# Patient Record
Sex: Male | Born: 1970 | Race: White | Hispanic: No | Marital: Married | Smoking: Former smoker
Health system: Southern US, Community
[De-identification: ages and names within clinical notes are randomized; demographics above are authoritative.]

## PROBLEM LIST (undated history)

## (undated) DIAGNOSIS — F329 Major depressive disorder, single episode, unspecified: Secondary | ICD-10-CM

## (undated) DIAGNOSIS — Q245 Malformation of coronary vessels: Secondary | ICD-10-CM

## (undated) DIAGNOSIS — Z8679 Personal history of other diseases of the circulatory system: Secondary | ICD-10-CM

## (undated) DIAGNOSIS — N201 Calculus of ureter: Secondary | ICD-10-CM

## (undated) DIAGNOSIS — R0602 Shortness of breath: Secondary | ICD-10-CM

## (undated) DIAGNOSIS — F32A Depression, unspecified: Secondary | ICD-10-CM

## (undated) DIAGNOSIS — I2510 Atherosclerotic heart disease of native coronary artery without angina pectoris: Secondary | ICD-10-CM

## (undated) DIAGNOSIS — Z8719 Personal history of other diseases of the digestive system: Secondary | ICD-10-CM

## (undated) DIAGNOSIS — I10 Essential (primary) hypertension: Secondary | ICD-10-CM

## (undated) DIAGNOSIS — K219 Gastro-esophageal reflux disease without esophagitis: Secondary | ICD-10-CM

## (undated) DIAGNOSIS — N200 Calculus of kidney: Secondary | ICD-10-CM

## (undated) DIAGNOSIS — R300 Dysuria: Secondary | ICD-10-CM

## (undated) HISTORY — PX: CARDIAC CATHETERIZATION: SHX172

---

## 1990-06-12 HISTORY — PX: OTHER SURGICAL HISTORY: SHX169

## 2000-01-17 ENCOUNTER — Emergency Department (HOSPITAL_COMMUNITY): Admission: EM | Admit: 2000-01-17 | Discharge: 2000-01-17 | Payer: Self-pay | Admitting: Emergency Medicine

## 2000-01-17 ENCOUNTER — Encounter: Payer: Self-pay | Admitting: Emergency Medicine

## 2000-06-09 ENCOUNTER — Emergency Department (HOSPITAL_COMMUNITY): Admission: EM | Admit: 2000-06-09 | Discharge: 2000-06-09 | Payer: Self-pay | Admitting: Emergency Medicine

## 2002-02-04 ENCOUNTER — Encounter: Payer: Self-pay | Admitting: Emergency Medicine

## 2002-02-04 ENCOUNTER — Ambulatory Visit: Admission: RE | Admit: 2002-02-04 | Discharge: 2002-02-04 | Payer: Self-pay | Admitting: Emergency Medicine

## 2003-08-31 ENCOUNTER — Emergency Department (HOSPITAL_COMMUNITY): Admission: EM | Admit: 2003-08-31 | Discharge: 2003-08-31 | Payer: Self-pay | Admitting: Emergency Medicine

## 2004-09-09 ENCOUNTER — Emergency Department (HOSPITAL_COMMUNITY): Admission: EM | Admit: 2004-09-09 | Discharge: 2004-09-09 | Payer: Self-pay | Admitting: Emergency Medicine

## 2008-04-02 ENCOUNTER — Emergency Department (HOSPITAL_COMMUNITY): Admission: EM | Admit: 2008-04-02 | Discharge: 2008-04-02 | Payer: Self-pay | Admitting: Family Medicine

## 2009-03-12 ENCOUNTER — Emergency Department (HOSPITAL_COMMUNITY): Admission: EM | Admit: 2009-03-12 | Discharge: 2009-03-12 | Payer: Self-pay | Admitting: Family Medicine

## 2009-06-04 ENCOUNTER — Emergency Department (HOSPITAL_COMMUNITY): Admission: EM | Admit: 2009-06-04 | Discharge: 2009-06-04 | Payer: Self-pay | Admitting: Emergency Medicine

## 2009-06-07 ENCOUNTER — Emergency Department (HOSPITAL_COMMUNITY): Admission: EM | Admit: 2009-06-07 | Discharge: 2009-06-07 | Payer: Self-pay | Admitting: Emergency Medicine

## 2009-10-12 ENCOUNTER — Emergency Department (HOSPITAL_COMMUNITY): Admission: EM | Admit: 2009-10-12 | Discharge: 2009-10-12 | Payer: Self-pay | Admitting: Family Medicine

## 2010-01-06 ENCOUNTER — Inpatient Hospital Stay (HOSPITAL_COMMUNITY): Admission: EM | Admit: 2010-01-06 | Discharge: 2010-01-07 | Payer: Self-pay | Admitting: Emergency Medicine

## 2010-06-24 ENCOUNTER — Inpatient Hospital Stay (HOSPITAL_COMMUNITY)
Admission: EM | Admit: 2010-06-24 | Discharge: 2010-06-24 | Payer: Self-pay | Source: Home / Self Care | Attending: Interventional Cardiology | Admitting: Interventional Cardiology

## 2010-06-27 LAB — CBC
HCT: 42.3 % (ref 39.0–52.0)
Hemoglobin: 15.3 g/dL (ref 13.0–17.0)
MCH: 31.2 pg (ref 26.0–34.0)
MCHC: 36.2 g/dL — ABNORMAL HIGH (ref 30.0–36.0)
MCV: 86.2 fL (ref 78.0–100.0)
Platelets: 198 10*3/uL (ref 150–400)
RBC: 4.91 MIL/uL (ref 4.22–5.81)
RDW: 12.1 % (ref 11.5–15.5)
WBC: 5.2 10*3/uL (ref 4.0–10.5)

## 2010-06-27 LAB — CK TOTAL AND CKMB (NOT AT ARMC)
CK, MB: 4.7 ng/mL — ABNORMAL HIGH (ref 0.3–4.0)
Relative Index: 4.6 — ABNORMAL HIGH (ref 0.0–2.5)
Total CK: 102 U/L (ref 7–232)

## 2010-06-27 LAB — BASIC METABOLIC PANEL
BUN: 18 mg/dL (ref 6–23)
CO2: 25 mEq/L (ref 19–32)
Calcium: 9.5 mg/dL (ref 8.4–10.5)
Chloride: 103 mEq/L (ref 96–112)
Creatinine, Ser: 1.01 mg/dL (ref 0.4–1.5)
GFR calc Af Amer: >60 mL/min (ref >60)
GFR calc non Af Amer: >60 mL/min (ref >60)
Glucose, Bld: 99 mg/dL (ref 70–99)
Potassium: 4 mEq/L (ref 3.5–5.1)
Sodium: 136 mEq/L (ref 135–145)

## 2010-06-27 LAB — TROPONIN I: Troponin I: 0.01 ng/mL (ref 0.00–0.06)

## 2010-06-27 LAB — DIFFERENTIAL
Basophils Absolute: 0 10*3/uL (ref 0.0–0.1)
Basophils Relative: 1 % (ref 0–1)
Eosinophils Absolute: 0.1 10*3/uL (ref 0.0–0.7)
Eosinophils Relative: 2 % (ref 0–5)
Lymphocytes Relative: 42 % (ref 12–46)
Lymphs Abs: 2.2 10*3/uL (ref 0.7–4.0)
Monocytes Absolute: 0.5 10*3/uL (ref 0.1–1.0)
Monocytes Relative: 9 % (ref 3–12)
Neutro Abs: 2.4 10*3/uL (ref 1.7–7.7)
Neutrophils Relative %: 47 % (ref 43–77)

## 2010-06-27 LAB — CARDIAC PANEL(CRET KIN+CKTOT+MB+TROPI)
CK, MB: 3.7 ng/mL (ref 0.3–4.0)
Relative Index: INVALID (ref 0.0–2.5)
Total CK: 75 U/L (ref 7–232)
Troponin I: <0.01 ng/mL (ref 0.00–0.06)

## 2010-07-21 NOTE — Discharge Summary (Signed)
  [name], [name]             ACCOUNT NO.:  [id number]  MEDICAL RECORD NO.:  [id number]          PATIENT TYPE:  INP  LOCATION:  2022                         FACILITY:  [facility]  PHYSICIAN:  Lyn Records, M.D.   DATE OF BIRTH:  March 21, 1971  DATE OF ADMISSION:  06/24/2010 DATE OF DISCHARGE:  06/24/2010                              DISCHARGE SUMMARY   REASON FOR ADMISSION:  Chest pain.  DISCHARGE DIAGNOSES: 1. Chest pain without evidence of myocardial infarction. 2. 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 Hypertension. 4. Hyperlipidemia.  PROCEDURES PERFORMED:  None.  DISCHARGE INSTRUCTIONS:  Increase metoprolol succinate to 50 mg per day. Other medications will be continued as prior to admission including: 1. Amlodipine 5 mg daily. 2. Aspirin 325 mg daily. 3. Citalopram 20 mg daily. 4. Ibuprofen p.r.n., but he is advised to minimize the use of     ibuprofen. 5. Isosorbide mononitrate 60 mg daily. 6. Multiple vitamins 1 per day. 7. Nitroglycerin 0.4 mg sublingually p.r.n. 8. Omega-3 fatty acid 2 g daily. 9. Omeprazole 20 mg per day. 10.Simvastatin 10 mg per day. 11.Osteo Bi-Flex 1 tablet daily.  ACTIVITY:  As tolerated.  OFFICE VISIT:  Dr. Verdis Prime on July 01, 2010.  DIET:  Heart healthy.  The patient may return to work.  HISTORY AND PHYSICAL AND HOSPITAL COURSE:  The patient has a history of that is concerning for eventual development of coronary artery disease. There is a family history, he has had recurring episodes of chest pain, and is known to have a mid LAD myocardial bridge demonstrated by cath in July 2011.  He has done well for the last 6 months.  Early this morning, while at work, he began having episodes of chest discomfort radiating to the base of his neck.  These episodes were relieved by nitroglycerin.  EKGs were performed and did not demonstrate  evidence of ischemia. Multiple sets of cardiac markers were abnormal.  A nitroglycerin patch was placed and eventually the discomfort has resolved.  In the absence of EKG changes and with no recurrence of discomfort with activity, he is discharged home.  His medication regimen will be adjusted by increasing metoprolol succinate to 50 mg per day.  The patient's laboratory data on admission included a hemoglobin of 15.3, normal white blood cell count, normal platelet count at 198,000, potassium of 4.0.  The initial CK-MB was 4.7 with a reference range of 4.0.  Total CPK was 102.  Troponin values have all been completely normal.  The patient is deemed ambulatory and able to be discharged.     Lyn Records, M.D.     HWS/MEDQ  D:  06/24/2010  T:  06/25/2010  Job:  [id number]  Electronically Signed by Verdis Prime M.D. on 07/21/2010 04:57:12 PM

## 2010-08-11 ENCOUNTER — Ambulatory Visit (INDEPENDENT_AMBULATORY_CARE_PROVIDER_SITE_OTHER): Payer: 59

## 2010-08-11 ENCOUNTER — Inpatient Hospital Stay (INDEPENDENT_AMBULATORY_CARE_PROVIDER_SITE_OTHER)
Admission: RE | Admit: 2010-08-11 | Discharge: 2010-08-11 | Disposition: A | Discharge status: Home / Self Care | Payer: 59 | Source: Ambulatory Visit | Attending: Emergency Medicine | Admitting: Emergency Medicine

## 2010-08-11 DIAGNOSIS — S92919A Unspecified fracture of unspecified toe(s), initial encounter for closed fracture: Secondary | ICD-10-CM

## 2010-08-27 LAB — DIFFERENTIAL
Basophils Absolute: 0 10*3/uL (ref 0.0–0.1)
Basophils Relative: 1 % (ref 0–1)
Eosinophils Relative: 2 % (ref 0–5)
Monocytes Absolute: 0.3 10*3/uL (ref 0.1–1.0)
Monocytes Relative: 9 % (ref 3–12)
Neutro Abs: 2 10*3/uL (ref 1.7–7.7)

## 2010-08-27 LAB — CK TOTAL AND CKMB (NOT AT ARMC)
CK, MB: 3.4 ng/mL (ref 0.3–4.0)
Total CK: 95 U/L (ref 7–232)

## 2010-08-27 LAB — COMPREHENSIVE METABOLIC PANEL
ALT: 29 U/L (ref 0–53)
AST: 25 U/L (ref 0–37)
Albumin: 4.4 g/dL (ref 3.5–5.2)
Alkaline Phosphatase: 54 U/L (ref 39–117)
BUN: 14 mg/dL (ref 6–23)
Chloride: 106 mEq/L (ref 96–112)
GFR calc Af Amer: >60 mL/min (ref >60)
Potassium: 3.8 mEq/L (ref 3.5–5.1)
Sodium: 139 mEq/L (ref 135–145)
Total Bilirubin: 0.9 mg/dL (ref 0.3–1.2)
Total Protein: 6.9 g/dL (ref 6.0–8.3)

## 2010-08-27 LAB — APTT: aPTT: 27 seconds (ref 24–37)

## 2010-08-27 LAB — CARDIAC PANEL(CRET KIN+CKTOT+MB+TROPI)
CK, MB: 1.6 ng/mL (ref 0.3–4.0)
Relative Index: INVALID (ref 0.0–2.5)
Relative Index: INVALID (ref 0.0–2.5)
Relative Index: INVALID (ref 0.0–2.5)
Total CK: 70 U/L (ref 7–232)
Troponin I: 0.01 ng/mL (ref 0.00–0.06)
Troponin I: 0.01 ng/mL (ref 0.00–0.06)
Troponin I: <0.01 ng/mL (ref 0.00–0.06)

## 2010-08-27 LAB — CBC
HCT: 40 % (ref 39.0–52.0)
Hemoglobin: 14.4 g/dL (ref 13.0–17.0)
MCV: 90.7 fL (ref 78.0–100.0)
RBC: 4.41 MIL/uL (ref 4.22–5.81)
RDW: 12.6 % (ref 11.5–15.5)
WBC: 4.1 10*3/uL (ref 4.0–10.5)

## 2010-08-27 LAB — D-DIMER, QUANTITATIVE: D-Dimer, Quant: 0.24 ug/mL-FEU (ref 0.00–0.48)

## 2010-09-12 LAB — URINALYSIS, ROUTINE W REFLEX MICROSCOPIC
Bilirubin Urine: NEGATIVE
Glucose, UA: NEGATIVE mg/dL
Ketones, ur: NEGATIVE mg/dL
Protein, ur: NEGATIVE mg/dL
pH: 6.5 (ref 5.0–8.0)

## 2010-10-28 NOTE — Consult Note (Signed)
NAMEZAIAH, ECKERSON             ACCOUNT NO.:  [id number]   MEDICAL RECORD NO.:  [id number]          PATIENT TYPE:  EMS   LOCATION:  MINO                         FACILITY:  [facility]   PHYSICIAN:  Cherylynn Ridges, M.D.    DATE OF BIRTH:  [birth year]   DATE OF CONSULTATION:  09/09/2004  DATE OF DISCHARGE:  09/09/2004                                   CONSULTATION   I was asked by the family members to see Mr. [name], a 40 year old,  with abdominal pain.  He has been worked up recently over the past month at  [facility] for this problem, the results of which have so far  been negative.  His workup has included an ultrasound and a CT scan, all of  which have been reported as normal.  However, I have not seen these tests on  my own.  It was suggested with this presentation today with some right upper  quadrant discomfort, nausea, and vomiting that the patient may have  gallstones or cholecystitis.  Therefore, I went ahead and ordered a HIDA  scan on the patient in the ED.  This demonstrated a decreased ejection  fraction of 39%;  however, no worsening of his pain.   PAST MEDICAL HISTORY SIGNIFICANT FOR:  1.  Hypertension.  2.  Asthma.  3.  History of depression.   PAST SURGICAL HISTORY:  Unremarkable.  He has had no abdominal surgery.   MEDICATIONS:  1.  Lexapro.  2.  Prilosec.  3.  Timolol.  4.  Lisinopril.  5.  Aspirin.   REVIEW OF SYSTEMS:  He has had nausea and inability to eat solid foods,  normal bowel movements, has had some fevers, no chills.   FAMILY HISTORY:  Noncontributory.   PHYSICAL EXAMINATION TODAY:  VITAL SIGNS:  He came in with a temperature of  99.1.  He is now 97.6.  Pulse was 75, blood pressure 127/74.  HEENT:  He is normocephalic and atraumatic and anicteric.  He was a little  bit erythematous and flushed when I first saw him, but this has resolved  recently.  NECK:  Supple, no palpable masses, no carotid bruits, no adenopathy.  LUNGS:  Clear to auscultation.  He has normal excursion.  CARDIAC EXAM:  Regular rhythm and rate with no murmurs, gallops, thrills, or  heaves.  ABDOMEN:  Soft with minimal tenderness, no palpable masses.  He has good  bowel sounds.  RECTAL EXAM:  Not done by me.   LABORATORY STUDIES:  Hemoglobin normal, white count normal, platelets  normal.  His LFTs show a slight increase in his ALT and AST at 40 and 50,  respective.  His alkaline phosphatase is normal.  Total bilirubin is normal.  His lipase level is normal also.  His HIDA scan demonstrates an ejection  fraction of 39%;  however, the significance of this is unknown.   IMPRESSION:  I do not feel as though the patient has an acute surgical  problem;  however, he could have some chronic cholecystitis which may  respond to a cholecystectomy in the future.  I have  given the patient my  card and asked him to make an appointment to see me early next week.  At  that time, we can review his x-rays and studies from [facility] for  further followup.  In addition, he should stay on clear liquids until these  symptoms resolve and continue to take half a Phenergan tablet or 12.5 mg for  nausea.      JOW/MEDQ  D:  09/09/2004  T:  09/10/2004  Job:  [id number]   cc:   Loletta Parish, M.D.

## 2010-12-21 ENCOUNTER — Inpatient Hospital Stay (HOSPITAL_COMMUNITY)
Admission: EM | Admit: 2010-12-21 | Discharge: 2010-12-22 | DRG: 315 | Disposition: A | Discharge status: Home / Self Care | Payer: 59 | Source: Ambulatory Visit | Attending: Interventional Cardiology | Admitting: Interventional Cardiology

## 2010-12-21 ENCOUNTER — Emergency Department (HOSPITAL_COMMUNITY): Payer: 59

## 2010-12-21 DIAGNOSIS — I319 Disease of pericardium, unspecified: Principal | ICD-10-CM | POA: Diagnosis present

## 2010-12-21 DIAGNOSIS — Z87891 Personal history of nicotine dependence: Secondary | ICD-10-CM

## 2010-12-21 DIAGNOSIS — I10 Essential (primary) hypertension: Secondary | ICD-10-CM | POA: Diagnosis present

## 2010-12-21 DIAGNOSIS — Q245 Malformation of coronary vessels: Secondary | ICD-10-CM

## 2010-12-21 DIAGNOSIS — Z7982 Long term (current) use of aspirin: Secondary | ICD-10-CM

## 2010-12-21 LAB — POCT I-STAT, CHEM 8
BUN: 22 mg/dL (ref 6–23)
Calcium, Ion: 1.16 mmol/L (ref 1.12–1.32)
Chloride: 106 mEq/L (ref 96–112)
Creatinine, Ser: 0.9 mg/dL (ref 0.50–1.35)
Glucose, Bld: 102 mg/dL — ABNORMAL HIGH (ref 70–99)

## 2010-12-21 LAB — CBC
HCT: 35.9 % — ABNORMAL LOW (ref 39.0–52.0)
MCH: 32 pg (ref 26.0–34.0)
MCHC: 37 g/dL — ABNORMAL HIGH (ref 30.0–36.0)
MCV: 86.3 fL (ref 78.0–100.0)
RDW: 12.4 % (ref 11.5–15.5)

## 2010-12-21 LAB — CARDIAC PANEL(CRET KIN+CKTOT+MB+TROPI)
CK, MB: 1.7 ng/mL (ref 0.3–4.0)
CK, MB: 1.7 ng/mL (ref 0.3–4.0)
Relative Index: INVALID (ref 0.0–2.5)
Total CK: 52 U/L (ref 7–232)
Troponin I: <0.3 ng/mL (ref <0.30)

## 2010-12-22 LAB — HIGH SENSITIVITY CRP: CRP, High Sensitivity: 0.1 mg/L

## 2010-12-22 LAB — SEDIMENTATION RATE: Sed Rate: 0 mm/hr (ref 0–16)

## 2010-12-22 LAB — CARDIAC PANEL(CRET KIN+CKTOT+MB+TROPI): Total CK: 37 U/L (ref 7–232)

## 2011-01-05 NOTE — Discharge Summary (Signed)
  [name], [name] NO.:  [id number]  MEDICAL RECORD NO.:  [id number]  LOCATION:  3740                         FACILITY:  [facility]  PHYSICIAN:  Lyn Records, M.D.   DATE OF BIRTH:  10-08-1970  DATE OF ADMISSION:  12/21/2010 DATE OF DISCHARGE:  12/22/2010                              DISCHARGE SUMMARY   REASON FOR ADMISSION:  Chest pain.  DISCHARGE DIAGNOSES: 1. Chest pain, left precordial with pleuritic properties, rule out     relapsing pericarditis. 2. Known history of myocardial bridge documented by catheterization,     otherwise clean coronary arteries.  The bridge is in the left     anterior descending. 3. 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 4. Hypertension.  PLAN: 1. Discontinue amlodipine. 2. Start colchicine 0.6 mg daily.  Continue aspirin 325 mg per day,     citalopram 20 mg per day, ibuprofen 200 mg 3 times per day as     needed, Imdur 120 mg per day, metoprolol succinate 50 mg per day,     multiple vitamins 1 per day, nitroglycerin 0.4 mg or 1 spray every     5 minutes as needed for chest pain up to a total of 3 doses within     15 minutes, Omega-3-acid ethyl esters 2 tablets daily, omeprazole     20 mg per day, simvastatin 10 mg every morning, and Osteo Bi-Flex.  FOLLOWUPLyn Records, MD on February 01, 2011, at 11:45 a.m.  ACTIVITIES:  No restrictions.  The patient is to call if recurrent symptoms or side effects from newly started medications or elevation of blood pressure.  HISTORY AND PHYSICAL AND HOSPITAL COURSE:  Please see Dr. Hoyle Barr admitting note.  Cardiac markers were negative.  EKGs were unremarkable and there was no evidence of evolution.  This is a recurring problem with this patient and we do not have a confirmed diagnosis.  We know he has a myocardial bridge.  I have assumed that there is a component of microvascular coronary artery disease.  Some symptoms seem to respond to nitrates.  There is at time  a slightly different exertional component.  This particular occasion had more pleuritic qualities and did radiate to the neck and back raising the possibility of perhaps a recurring chronic/relapsing pericarditis. This will be purely a clinical diagnosis as there were no ST-segment changes or pericardial rub to substantiate this.  These features and relapsing pericarditis, however, can be absent.  On the morning of discharge, we have drawn an inflammatory panel.  We will start colchicine 0.6 mg per day as a chronic suppressant.  Clinical followup in 4-6 weeks.     Lyn Records, M.D.     HWS/MEDQ  D:  12/22/2010  T:  12/22/2010  Job:  [id number]  Electronically Signed by Verdis Prime M.D. on 01/05/2011 11:27:50 AM

## 2011-01-11 NOTE — H&P (Signed)
NAMEBROGAN, [name] NO.:  [id number]  MEDICAL RECORD NO.:  [id number]  LOCATION:  3740                         FACILITY:  [facility]  PHYSICIAN:  Corky Crafts, MDDATE OF BIRTH:  Apr 17, 1971  DATE OF ADMISSION:  12/21/2010 DATE OF DISCHARGE:                             HISTORY & PHYSICAL   PRIMARY CARDIOLOGIST:  Lyn Records, MD  REASON FOR ADMISSION:  Chest pain.  HISTORY OF PRESENT ILLNESS:  A 40 year old who has been worked up for chest pain in the past.  He has a history of LAD myocardial bridge and he has been treated for microvascular disease with medicines by Dr. Katrinka Blazing.  He had been doing well over the past few weeks.  He had been cycling regularly without any chest discomfort.  He was at work this morning at about 1 a.m.  He started having sharp chest discomfort, it turned into a tightness.  He tried a sublingual nitroglycerin which did not give any relief.  He took his medicines early and his discomfort persisted.  He called the on-call physician who instructed him go to the emergency room.  He then received nitro paste in the ER with relief of his pain.  Just before that, he had some nausea and worsening chest discomfort and sweating with that, that went away with some Zofran. Currently, he is pain free.  He has had a total of 7-8 hours of chest discomfort.  PAST MEDICAL HISTORY: 1. Remote history of asthma. 2. Chest pain. 3. Varicocele.  PAST SURGICAL HISTORY:  Varicocele surgery.  ALLERGIES:  He is intolerant of NSAIDS, CODEINE, CHANTIX, and ERYTHROMYCIN.  MEDICATIONS AT HOME: 1. Norvasc 5 mg daily. 2. Imdur 120 mg daily. 3. Metoprolol extended release 50 mg daily. 4. Zocor 10 mg daily. 5. Aspirin 325 mg daily. 6. Celexa 20 mg daily. 7. Magnesium supplement. 8. Multivitamin. 9. Glucosamine. 10.Fish oil.  SOCIAL HISTORY:  He quit smoking about 5 years ago.  He works at [employer]. He quit alcohol 6 years ago.  He drinks 1-2 sodas a  day.  FAMILY HISTORY:  His father died of a lung cancer at age 39.  Brother had an MI at age 49 and has stents in his LAD.  REVIEW OF SYSTEMS:  Significant for the nausea with chest pain, sweating with nausea.  In the ER, no swelling.  No bleeding problems, chest discomfort as described above.  All other systems negative.  PHYSICAL EXAMINATION:  VITAL SIGNS:  Blood pressure 90/45, heart rate of 15. GENERAL:  He is awake and alert, in no apparent distress. HEENT:  Head, normocephalic, atraumatic. NECK:  No carotid bruits. CARDIOVASCULAR:  Bradycardic, S1 and S2.  No murmurs. LUNGS:  Clear to auscultation bilaterally. ABDOMEN:  Soft, nontender. EXTREMITIES:  Showed no edema, 2+ posterior tibial pulses bilaterally. NEUROLOGIC:  No focal motor or sensory deficits. SKIN:  No rash.  ECG shows normal sinus rhythm with diffuse early repolarization. Hemoglobin 13.3, troponin less than 0.3, creatinine 0.9.  Chest x-ray is pending.  ASSESSMENT:  A 40 year old with chest pain.  PLAN: 1. Cardiac:  There is some atypical features to his chest pain.     However, this was a prolonged episode.  We will observe the patient     on telemetry and rule out for MI with enzymes.  We will continue     his current medicines.  His blood pressure and heart rate are at     the lower limits of normal.  We would be unable to titrate his beta-     blocker or Norvasc at this time.  Consider Ranexa for further     treatment of his microvascular disease. 2. Fairly recent cath showed only an LAD bridge, no significant     coronary artery disease.  Continue aggressive risk factor     modification including statin therapy.     Corky Crafts, MD     JSV/MEDQ  D:  12/21/2010  T:  12/21/2010  Job:  [id number]  Electronically Signed by Lance Muss MD on 01/11/2011 01:19:52 PM

## 2011-03-13 LAB — POCT I-STAT, CHEM 8
Chloride: 103
Creatinine, Ser: 1.1
HCT: 45
Hemoglobin: 15.3
Potassium: 3.5
Sodium: 141

## 2011-08-03 ENCOUNTER — Other Ambulatory Visit: Payer: Self-pay | Admitting: Family Medicine

## 2011-08-03 ENCOUNTER — Ambulatory Visit
Admission: RE | Admit: 2011-08-03 | Discharge: 2011-08-03 | Disposition: A | Discharge status: Home / Self Care | Payer: 59 | Source: Ambulatory Visit | Attending: Family Medicine | Admitting: Family Medicine

## 2011-08-03 DIAGNOSIS — R109 Unspecified abdominal pain: Secondary | ICD-10-CM

## 2011-08-04 ENCOUNTER — Encounter (HOSPITAL_COMMUNITY): Payer: Self-pay | Admitting: Pharmacy Technician

## 2011-08-04 ENCOUNTER — Other Ambulatory Visit: Payer: Self-pay | Admitting: Urology

## 2011-08-04 ENCOUNTER — Encounter (HOSPITAL_COMMUNITY): Payer: Self-pay | Admitting: *Deleted

## 2011-08-04 ENCOUNTER — Emergency Department (HOSPITAL_COMMUNITY)
Admission: EM | Admit: 2011-08-04 | Discharge: 2011-08-04 | Disposition: A | Discharge status: Home / Self Care | Payer: 59 | Attending: Emergency Medicine | Admitting: Emergency Medicine

## 2011-08-04 ENCOUNTER — Encounter (HOSPITAL_COMMUNITY): Payer: Self-pay | Admitting: Emergency Medicine

## 2011-08-04 DIAGNOSIS — I10 Essential (primary) hypertension: Secondary | ICD-10-CM | POA: Insufficient documentation

## 2011-08-04 DIAGNOSIS — R319 Hematuria, unspecified: Secondary | ICD-10-CM | POA: Insufficient documentation

## 2011-08-04 DIAGNOSIS — N23 Unspecified renal colic: Secondary | ICD-10-CM | POA: Insufficient documentation

## 2011-08-04 DIAGNOSIS — K219 Gastro-esophageal reflux disease without esophagitis: Secondary | ICD-10-CM | POA: Insufficient documentation

## 2011-08-04 DIAGNOSIS — R110 Nausea: Secondary | ICD-10-CM | POA: Insufficient documentation

## 2011-08-04 DIAGNOSIS — R109 Unspecified abdominal pain: Secondary | ICD-10-CM | POA: Insufficient documentation

## 2011-08-04 HISTORY — DX: Essential (primary) hypertension: I10

## 2011-08-04 HISTORY — DX: Calculus of kidney: N20.0

## 2011-08-04 HISTORY — DX: Gastro-esophageal reflux disease without esophagitis: K21.9

## 2011-08-04 LAB — URINALYSIS, ROUTINE W REFLEX MICROSCOPIC
Nitrite: NEGATIVE
Specific Gravity, Urine: 1.006 (ref 1.005–1.030)
Urobilinogen, UA: 0.2 mg/dL (ref 0.0–1.0)
pH: 6 (ref 5.0–8.0)

## 2011-08-04 LAB — URINE MICROSCOPIC-ADD ON

## 2011-08-04 MED ORDER — ONDANSETRON 4 MG PO TBDP
8.0000 mg | ORAL_TABLET | Freq: Once | ORAL | Status: AC
  Administered 2011-08-04: 8 mg via ORAL
  Filled 2011-08-04: qty 2

## 2011-08-04 NOTE — Discharge Instructions (Signed)
Ureteral Colic Ureteral colic is spasm-like pain from the kidney or the ureter. This is often caused by a kidney stone. The pain is caused by the stone trying to get through the tubes that pass your pee. HOME CARE   Drink enough fluids to keep your pee (urine) clear or pale yellow.   Strain all your pee. A strainer will be provided. Keep anything caught in the strainer and bring it to your doctor. The stone causing the pain may be very small.   Only take medicine as told by your doctor.   Follow up with your doctor as told.  GET HELP RIGHT AWAY IF:   Pain is not controlled with medicine.   Pain continues or gets worse.   The pain changes and there is chest or belly (abdominal) pain.   You pass out (faint).   You cannot pee.   You keep throwing up (vomiting).   You have a temperature by mouth above 102 F (38.9 C), not controlled by medicine.  MAKE SURE YOU:   Understand these instructions.   Will watch this condition.   Will get help right away if you are not doing well or get worse.  Document Released: 11/15/2007 Document Revised: 02/08/2011 Document Reviewed: 11/15/2007 Exit[employer] Patient Information 2012 [employer]. 

## 2011-08-04 NOTE — ED Notes (Signed)
Patient seen yesterday with kidney stone in ureter and in kidney.  Now with nausea and vomiting.

## 2011-08-04 NOTE — ED Provider Notes (Signed)
History     CSN: [id number]  Arrival date & time 08/04/11  3299   First MD Initiated Contact with Patient 08/04/11 [phone]      Chief Complaint  Patient presents with  . Nephrolithiasis    (Consider location/radiation/quality/duration/timing/severity/associated sxs/prior treatment) HPI Complains of left flank pain radiating to left testicle sharp moderate to severe onset 2 weeks ago becoming worse yesterday seen by Dr. Clarene Duke at [facility] physicians yesterday had outpatient CT scan consistent with left ureteral stone referred to Dr.Dahlsteadt. Presents this morning as discomfort worse and complains of nausea at present pain is 7 on scale of 1-10. Nothing makes symptoms better or worse feels like prior kidney stone no other associated symptoms. No treatment prior to coming here Past Medical History  Diagnosis Date  . Kidney calculi   . Hypertension   . GERD (gastroesophageal reflux disease)   . Angina pectoris, unstable     asthma, pericarditis History reviewed. No pertinent past surgical history.  History reviewed. No pertinent family history.  History  Substance Use Topics  . Smoking status: Former Games developer  . Smokeless tobacco: Not on file  . Alcohol Use: No      Review of Systems  Constitutional: Negative.   HENT: Negative.   Respiratory: Negative.   Cardiovascular: Negative.   Gastrointestinal: Positive for nausea.  Genitourinary: Positive for hematuria and flank pain.  Musculoskeletal: Negative.   Skin: Negative.   Neurological: Negative.   Hematological: Negative.   Psychiatric/Behavioral: Negative.     Allergies  Ancef; Chantix; Codeine; and Erythromycin  Home Medications  No current outpatient prescriptions on file.  BP 140/87  Pulse 74  Temp(Src) 97.8 F (36.6 C) (Oral)  Resp 16  SpO2 95%  Physical Exam  Nursing note and vitals reviewed. Constitutional: He appears well-developed and well-nourished. No distress.  HENT:  Head: Normocephalic and  atraumatic.  Eyes: Conjunctivae are normal. Pupils are equal, round, and reactive to light.  Neck: Neck supple. No tracheal deviation present. No thyromegaly present.  Cardiovascular: Normal rate and regular rhythm.   No murmur heard. Pulmonary/Chest: Effort normal and breath sounds normal.  Abdominal: Soft. Bowel sounds are normal. He exhibits no distension. There is no tenderness.  Genitourinary: Penis normal.       Normal male genitalia  Musculoskeletal: Normal range of motion. He exhibits no edema and no tenderness.  Neurological: He is alert. Coordination normal.  Skin: Skin is warm and dry. No rash noted.  Psychiatric: He has a normal mood and affect.    ED Course  Procedures (including critical care time)  Labs Reviewed - No data to display Ct Abdomen Pelvis Wo Contrast  08/03/2011  *RADIOLOGY REPORT*  Clinical Data: Left flank pain for several weeks, some nausea, microhematuria  CT ABDOMEN AND PELVIS WITHOUT CONTRAST  Technique:  Multidetector CT imaging of the abdomen and pelvis was performed following the standard protocol without intravenous contrast.  Comparison: CT abdomen pelvis of 06/04/2009  Findings: The lung bases are clear.  The liver is unremarkable in the unenhanced state.  The gallbladder is somewhat contracted and no calcified gallstones are seen.  The pancreas is normal in size and the pancreatic duct is not dilated.  The adrenal glands and spleen are unremarkable.  The stomach is moderately fluid distended with no abnormality noted.  A cyst emanates from the upper pole of the left kidney and measures slightly larger than on 06/04/2009.  A left upper pole renal calculus is noted of 3 x 6 mm.  There  is very slight fullness of the left pelvocaliceal system caused by a 4 x 7 mm proximal left ureteral calculus.  The more distal ureters are normal in caliber and no distal ureteral calculus is seen.  The urinary bladder is unremarkable.  The distal ureters appear normal.  The  prostate is normal in size.  The terminal ileum and appendix are unremarkable.  No acute bony abnormality is seen.  IMPRESSION:  1.  Minimal fullness of the left pelvocaliceal system caused by a 4 x 7 mm proximal left ureteral calculus. 2.  Nonobstructing 3 x 6 mm left upper pole renal calculus. 3.  The appendix and terminal ileum appear normal.  Original Report Authenticated By: Juline Patch, M.D.     No diagnosis found.  7:50 AM alert and Lipitor he appears comfortable after treatment with oral Zofran  MDM  Spoke with Dr.Wrenn. Plan patient to go directly to [facility] upon leaving here Patient states he sufficiently comfortable to go to the urology office. Patient alert and in) discharge Diagnosis ureteral colic        Doug Sou, MD 08/04/11 [phone]

## 2011-08-04 NOTE — Progress Notes (Signed)
Instructed to follow laxative as directed by doctor eve before procedure. Plenty of fluids and light dinner. Restricted meds as per [facility]. States EKG with in year with Dr. Katrinka Blazing.Driver must stay with patient in SS.

## 2011-08-06 NOTE — H&P (Signed)
Active Problems Problems  1. Nephrolithiasis Of Both Kidneys 592.0 2. Renal Cyst 593.2 3. Ureteral Stone Left 592.1 4. Varicocele 456.4  History of Present Illness     [name] is a former patient of Dr. Retta Diones with a history of a left renal cyst and bilateral renal stones.  He had the onset over the last 2 weeks of left flank pain.  Two nights ago the pain became more severe with testicular pain.  He had increased pain this morning with nausea and he went to the ER.  He had a CT that showed a 4x73mm left proximal stone.  He had gross hematuria  about 2 weeks ago.  He has no voiding complaints.  He has no other associated signs or symptoms.   Past Medical History Problems  1. History of  Asthma 493.90 2. History of  Depression 311 3. History of  Heartburn 787.1 4. History of  Hypercholesterolemia 272.0 5. History of  Hypertension 401.9  Surgical History Problems  1. History of  Surgery Spermatic Cord Excision Of Varicocele  Current Meds 1. Advair Diskus 250-50 MCG/DOSE Inhalation Aerosol Powder Breath Activated; Therapy:  (Recorded:22Feb2013) to 2. Aspirin 81 MG Oral Tablet; Therapy: (Recorded:20Jan2011) to 3. Colchicine 0.6 MG TABS; Therapy: (Recorded:22Feb2013) to 4. Daily Multivitamin TABS; Therapy: (Recorded:20Jan2011) to 5. Glucosamine CAPS; Therapy: (Recorded:20Jan2011) to 6. Metoprolol Succinate ER 50 MG Oral Tablet Extended Release 24 Hour; Therapy:  (Recorded:22Feb2013) to 7. Tamsulosin HCl 0.4 MG Oral Capsule; Therapy: (Recorded:22Feb2013) to 8. Zocor 10 MG Oral Tablet; Therapy: (Recorded:20Jan2011) to  Allergies Medication  1. Chantix TABS 2. Erythromycin Base TABS  Family History Problems  1. Maternal history of  Arthritis V17.7 2. Maternal history of  Brain Tumor 3. Family history of  Death In The Family Father 2- lung ca 4. Family history of  Family Health Status - Mother's Age 47 5. Paternal history of  Lung Cancer V16.1  Social History Problems    1. Caffeine Use 2-3 per day 2. Marital History - Currently Married 3. Occupation: RN surgical ICU- [employer] 4. Tobacco Use 305.1 smoked for about 15 yrs & quit 4 ys ago Denied  5. History of  Alcohol Use  Review of Systems Genitourinary, constitutional, skin, eye, otolaryngeal, hematologic/lymphatic, cardiovascular, pulmonary, endocrine, musculoskeletal, gastrointestinal, neurological and psychiatric system(s) were reviewed and pertinent findings if present are noted.  Genitourinary: hematuria.  Gastrointestinal: nausea, vomiting and flank pain, but no diarrhea and no constipation.  Constitutional: night sweats, but no fever.  Cardiovascular: no chest pain.  Respiratory: no shortness of breath.    Vitals Vital Signs [Data Includes: Last 1 Day]  22Feb2013 08:38AM  Blood Pressure: 132 / 84 Temperature: 97.9 F Heart Rate: 69 22Feb2013 08:36AM  BMI Calculated: 29.62 BSA Calculated: 2.06 Height: 5 ft 9 in Weight: 200 lb   Physical Exam Constitutional: Well nourished and well developed . No acute distress.  Pulmonary: No respiratory distress and normal respiratory rhythm and effort.  Cardiovascular: Heart rate and rhythm are normal . No peripheral edema.  Abdomen: No masses are palpated. The abdomen is not firm and no rebound. Moderate tenderness in the LLQ is present. moderate left CVA tenderness. No hernias are palpable. No hepatosplenomegaly noted.    Results/Data Urine [Data Includes: Last 1 Day]   22Feb2013  COLOR YELLOW   APPEARANCE CLEAR   SPECIFIC GRAVITY 1.015   pH 6.0   GLUCOSE NEG mg/dL  BILIRUBIN NEG   KETONE NEG mg/dL  BLOOD LARGE   PROTEIN NEG mg/dL  UROBILINOGEN  0.2 mg/dL  NITRITE NEG   LEUKOCYTE ESTERASE NEG   SQUAMOUS EPITHELIAL/HPF NONE SEEN   WBC NONE SEEN WBC/hpf  RBC 21-50 RBC/hpf  BACTERIA NONE SEEN   CRYSTALS NONE SEEN   CASTS NONE SEEN    Old records or history reviewed: I have spoken with Dr. Rennis Chris about this patient.  The  following images/tracing/specimen were independently visualized:  I have reviewed his CT's from this week and 2010. He has a 5cm LMP cyst and a 5mm left renal stone in addition to the ureteral stone. He previously had a right renal stone but that is not present on the recent film. KUB today shows the 4x10mm stone on the left at the top of the L4 transverse process. There is a small RUP stone as seen on CT and phleboliths in the pelvis. No other abnormalities are noted.    Assessment Assessed  1. Ureteral Stone Left 592.1 2. Renal Cyst 593.2   He has a 4x46mm left proximal ureteral stone with pain and nausea.   Plan Health Maintenance (V70.0)  1. UA With REFLEX  Done: 22Feb2013 08:24AM Ureteral Stone (592.1)  2. Ondansetron HCl 4 MG Oral Tablet; TAKE 1 TABLET 4 times daily PRN nausea; Therapy:  22Feb2013 to (Last Rx:22Feb2013)  Requested for: 22Feb2013; Edited 3. Oxycodone-Acetaminophen 5-325 MG Oral Tablet; take 1 or 2 tablets q 4-6 hours prn pain;  Therapy: 22Feb2013 to (Last Rx:22Feb2013) 4. KUB  Done: 22Feb2013 12:00AM 5. Follow-up Schedule Surgery Office  Follow-up  Done: 22Feb2013   I discussed medical therapy, ESWL and ureteroscopy.   He is interested in ESWL and I reviewed the risks of bleeding, infection, need for second procedures, injury to the kidney or adjacent organs and sedation risks. I will give him scripts for percocet and zofran.

## 2011-08-06 NOTE — Discharge Instructions (Signed)
Lithotripsy Care After Refer to this sheet for the next few weeks. These discharge instructions provide you with general information on caring for yourself after you leave the hospital. Your caregiver may also give you specific instructions. Your treatment has been planned according to the most current medical practices available, but unavoidable complications sometimes occur. If you have any problems or questions after discharge, please call your caregiver. AFTER THE PROCEDURE   The recovery time will vary with the procedure done.   You will be taken to the recovery area. A nurse will watch and check your progress. Once you are awake, stable, and taking fluids well, you will be allowed to go home as long as there are no problems.   Your urine may have a red tinge for a few days after treatment. Blood loss is usually minimal.   You may have soreness in the back or flank area. This usually goes away after a few days. The procedure can cause blotches or bruises on the back where the pressure wave enters the skin. These marks usually cause only minimal discomfort and should disappear in a short time.   Stone fragments should begin to pass within 24 hours of treatment. However, a delayed passage is not unusual.   You may have pain, discomfort, and feel sick to your stomach (nauseous) when the crushed (pulverized) fragments of stone are passed down the tube from the kidney to the bladder. Stone fragments can pass soon after the procedure and may last for up to 4 to 8 weeks.   A small number of patients may have severe pain when stone fragments are not able to pass, which leads to an obstruction.   If your stone is greater than 1 inch/2.5 centimeters in diameter or if you have multiple stones that have a combined diameter greater than 1 inch/2.5 centimeters, you may require more than 1 treatment.   You must have someone drive you home.  HOME CARE INSTRUCTIONS   Rest at home until you feel your  energy improving.   Only take over-the-counter or prescription medicines for pain, discomfort, or fever as directed by your caregiver. Depending on the type of lithotripsy, you may need to take medicines (antibiotics) that kill germs and anti-inflammatory medicines for a few days.   Drink enough water and fluids to keep your urine clear or pale yellow. This helps "flush" your kidneys. It helps pass any remaining pieces of stone and prevents stones from coming back.   Most people can resume daily activities within 1 or 2 days after standard lithotripsy. It can take longer to recover from laser and percutaneous lithotripsy.   If the stones are in your urinary system, you may be asked to strain your urine at home to look for stones. Any stones that are found can be sent to a medical lab for examination.   Visit your caregiver for a follow-up appointment in a few weeks. Your doctor may remove your stent if you have one. Your caregiver will also check to see whether stone particles still remain.  SEEK MEDICAL CARE IF:   You have an oral temperature above 102 F (38.9 C).   Your pain is not relieved by medicine.   You have a lasting nauseous feeling.   You feel there is too much blood in the urine.   You develop persistent problems with frequent and/or painful urination that does not at least partially improve after 2 days following the procedure.   You have a congested   cough.  °· You feel lightheaded.  °· You develop a rash or any other signs that might suggest an allergic problem.  °· You develop any reaction or side effects to your medicine(s).  °SEEK IMMEDIATE MEDICAL CARE IF:  °· You experience severe back and/or flank pain.  °· You see nothing but blood when you urinate.  °· You cannot pass any urine at all.  °· You have an oral temperature above 102° F (38.9° C), not controlled by medicine.  °· You develop shortness of breath, difficulty breathing, or chest pain.  °· You develop vomiting  that will not stop after 6 to 8 hours.  °· You have a fainting episode.  °MAKE SURE YOU:  °· Understand these instructions.  °· Will watch your condition.  °· Will get help right away if you are not doing well or get worse.  °Document Released: 06/18/2007 Document Revised: 01/25/2011 Document Reviewed: 06/18/2007 °[employer]® Patient Information ©2012 [employer]. °

## 2011-08-07 ENCOUNTER — Encounter (HOSPITAL_COMMUNITY)
Admission: RE | Disposition: A | Discharge status: Home / Self Care | Payer: Self-pay | Source: Ambulatory Visit | Attending: Urology

## 2011-08-07 ENCOUNTER — Ambulatory Visit (HOSPITAL_COMMUNITY)
Admission: RE | Admit: 2011-08-07 | Discharge: 2011-08-07 | Disposition: A | Discharge status: Home / Self Care | Payer: 59 | Source: Ambulatory Visit | Attending: Urology | Admitting: Urology

## 2011-08-07 ENCOUNTER — Ambulatory Visit (HOSPITAL_COMMUNITY): Payer: 59

## 2011-08-07 ENCOUNTER — Encounter (HOSPITAL_COMMUNITY): Payer: Self-pay

## 2011-08-07 DIAGNOSIS — Z7982 Long term (current) use of aspirin: Secondary | ICD-10-CM | POA: Insufficient documentation

## 2011-08-07 DIAGNOSIS — Z01818 Encounter for other preprocedural examination: Secondary | ICD-10-CM | POA: Insufficient documentation

## 2011-08-07 DIAGNOSIS — R319 Hematuria, unspecified: Secondary | ICD-10-CM | POA: Insufficient documentation

## 2011-08-07 DIAGNOSIS — N200 Calculus of kidney: Secondary | ICD-10-CM | POA: Insufficient documentation

## 2011-08-07 DIAGNOSIS — I209 Angina pectoris, unspecified: Secondary | ICD-10-CM | POA: Insufficient documentation

## 2011-08-07 DIAGNOSIS — J45909 Unspecified asthma, uncomplicated: Secondary | ICD-10-CM | POA: Insufficient documentation

## 2011-08-07 DIAGNOSIS — Z79899 Other long term (current) drug therapy: Secondary | ICD-10-CM | POA: Insufficient documentation

## 2011-08-07 DIAGNOSIS — N201 Calculus of ureter: Secondary | ICD-10-CM | POA: Insufficient documentation

## 2011-08-07 DIAGNOSIS — I10 Essential (primary) hypertension: Secondary | ICD-10-CM | POA: Insufficient documentation

## 2011-08-07 DIAGNOSIS — K449 Diaphragmatic hernia without obstruction or gangrene: Secondary | ICD-10-CM | POA: Insufficient documentation

## 2011-08-07 DIAGNOSIS — N281 Cyst of kidney, acquired: Secondary | ICD-10-CM | POA: Insufficient documentation

## 2011-08-07 DIAGNOSIS — E7800 Pure hypercholesterolemia, unspecified: Secondary | ICD-10-CM | POA: Insufficient documentation

## 2011-08-07 DIAGNOSIS — I861 Scrotal varices: Secondary | ICD-10-CM | POA: Insufficient documentation

## 2011-08-07 HISTORY — PX: EXTRACORPOREAL SHOCK WAVE LITHOTRIPSY: SHX1557

## 2011-08-07 HISTORY — DX: Depression, unspecified: F32.A

## 2011-08-07 HISTORY — DX: Shortness of breath: R06.02

## 2011-08-07 HISTORY — DX: Major depressive disorder, single episode, unspecified: F32.9

## 2011-08-07 SURGERY — LITHOTRIPSY, ESWL
Anesthesia: LOCAL | Laterality: Left

## 2011-08-07 MED ORDER — MORPHINE SULFATE 10 MG/ML IJ SOLN: 2.0000 mg | INTRAMUSCULAR | Status: DC | PRN

## 2011-08-07 MED ORDER — DIAZEPAM 5 MG PO TABS
ORAL_TABLET | ORAL | Status: AC
  Administered 2011-08-07: 10 mg via ORAL
  Filled 2011-08-07: qty 2

## 2011-08-07 MED ORDER — ONDANSETRON HCL 4 MG/2ML IJ SOLN: 4.0000 mg | Freq: Four times a day (QID) | INTRAMUSCULAR | Status: DC | PRN

## 2011-08-07 MED ORDER — OXYCODONE-ACETAMINOPHEN 5-325 MG PO TABS: 1.0000 | ORAL_TABLET | Freq: Four times a day (QID) | ORAL | Status: DC | PRN

## 2011-08-07 MED ORDER — ACETAMINOPHEN 650 MG RE SUPP
650.0000 mg | RECTAL | Status: DC | PRN
  Filled 2011-08-07: qty 1

## 2011-08-07 MED ORDER — CIPROFLOXACIN HCL 500 MG PO TABS
500.0000 mg | ORAL_TABLET | ORAL | Status: AC
  Administered 2011-08-07: 500 mg via ORAL

## 2011-08-07 MED ORDER — OXYCODONE HCL 5 MG PO TABS: 5.0000 mg | ORAL_TABLET | ORAL | Status: DC | PRN

## 2011-08-07 MED ORDER — DIPHENHYDRAMINE HCL 25 MG PO CAPS
ORAL_CAPSULE | ORAL | Status: AC
  Administered 2011-08-07: 25 mg via ORAL
  Filled 2011-08-07: qty 1

## 2011-08-07 MED ORDER — PROMETHAZINE HCL 25 MG/ML IJ SOLN: 12.5000 mg | Freq: Four times a day (QID) | INTRAMUSCULAR | Status: DC | PRN

## 2011-08-07 MED ORDER — CIPROFLOXACIN HCL 500 MG PO TABS
ORAL_TABLET | ORAL | Status: AC
  Administered 2011-08-07: 500 mg via ORAL
  Filled 2011-08-07: qty 1

## 2011-08-07 MED ORDER — SODIUM CHLORIDE 0.9 % IV SOLN: 250.0000 mL | INTRAVENOUS | Status: DC | PRN

## 2011-08-07 MED ORDER — DIPHENHYDRAMINE HCL 25 MG PO CAPS
25.0000 mg | ORAL_CAPSULE | ORAL | Status: AC
  Administered 2011-08-07: 25 mg via ORAL

## 2011-08-07 MED ORDER — DEXTROSE-NACL 5-0.45 % IV SOLN
INTRAVENOUS | Status: DC
  Administered 2011-08-07: 13:00:00 via INTRAVENOUS

## 2011-08-07 MED ORDER — SODIUM CHLORIDE 0.9 % IJ SOLN: 3.0000 mL | Freq: Two times a day (BID) | INTRAMUSCULAR | Status: DC

## 2011-08-07 MED ORDER — SODIUM CHLORIDE 0.9 % IJ SOLN: 3.0000 mL | INTRAMUSCULAR | Status: DC | PRN

## 2011-08-07 MED ORDER — DIAZEPAM 5 MG PO TABS
10.0000 mg | ORAL_TABLET | ORAL | Status: AC
  Administered 2011-08-07: 10 mg via ORAL

## 2011-08-07 MED ORDER — ACETAMINOPHEN 325 MG PO TABS: 650.0000 mg | ORAL_TABLET | ORAL | Status: DC | PRN

## 2011-08-07 NOTE — Interval H&P Note (Signed)
History and Physical Interval Note:  08/07/2011 2:05 PM  [name]  has presented today for surgery, with the diagnosis of left proximal stone  The various methods of treatment have been discussed with the patient and family. After consideration of risks, benefits and other options for treatment, the patient has consented to  Procedure(s) (LRB): EXTRACORPOREAL SHOCK WAVE LITHOTRIPSY (ESWL) (Left) as a surgical intervention .  The patients' history has been reviewed, patient examined, no change in status, stable for surgery.  I have reviewed the patients' chart and labs.  Questions were answered to the patient's satisfaction.   The stone has moved to the mid ureter on KUB today.   Melaina Howerton J

## 2011-08-07 NOTE — Progress Notes (Signed)
Patient has arrived from lithotripsy truck. Left goin is reddened with no skin breakdown from ESWL. 3cm x4cm.  Wife sent to outpt pharmacy to fill presciption.

## 2011-08-08 ENCOUNTER — Encounter (HOSPITAL_COMMUNITY): Payer: Self-pay

## 2011-08-14 ENCOUNTER — Encounter (HOSPITAL_BASED_OUTPATIENT_CLINIC_OR_DEPARTMENT_OTHER): Payer: Self-pay | Admitting: *Deleted

## 2011-08-14 ENCOUNTER — Other Ambulatory Visit: Payer: Self-pay | Admitting: Urology

## 2011-08-14 NOTE — Progress Notes (Signed)
NPO AFTER MN. ARRIVES AT 0800. NEEDS ISTAT. CURRENT EKG W/ CHART. WILL TAKE NEXIUM AND TOPROL AM OF SURG. W/ SIP OF WATER.

## 2011-08-14 NOTE — H&P (Addendum)
History of Present Illness    41 y/o white male with a history of a left renal cyst and bilateral renal stones presents for continued evaluation of left distal ureteral stone s/p recent left ESWL 08/07/11.  He had the onset of left flank pain 1 month ago.  On 08/02/11 the pain became more severe with testicular pain with nausea which progressively worsened and he went to the ER in the early hours 08/04/11.  He had a CT that showed a 4x59mm left proximal stone.  He had an episode of gross hematuria about 2 weeks prior.  He has no voiding complaints.  He has no other associated signs or symptoms.  Interval Hx: He underwent left ESWL 08/07/11 and presents today for follow up.  He has not passed any stone fragments despite straining his urine consistently.  He has had continued left flank pain into the LLQ abd/groin with associated nausea and is requiring narcotic pain medications around the clock to remain comfortable. He is using Zofran prn nausea which helps.  He is missing a lot of work due to this stone and is anxious to return to his job.  He deneis recent gross hematuria, dysuria, fever or chills.  The pain is unchanged with position or activity.   Past Medical History Problems  1. History of  Asthma 493.90 2. History of  Depression 311 3. History of  Heartburn 787.1 4. History of  Hypercholesterolemia 272.0 5. History of  Hypertension 401.9  Surgical History Problems  1. History of  Lithotripsy 2. History of  Surgery Spermatic Cord Excision Of Varicocele  Current Meds 1. Advair Diskus 250-50 MCG/DOSE Inhalation Aerosol Powder Breath Activated; Therapy:  (Recorded:22Feb2013) to 2. Colchicine 0.6 MG TABS; Therapy: (Recorded:22Feb2013) to 3. Metoprolol Succinate ER 50 MG Oral Tablet Extended Release 24 Hour; Therapy:  (Recorded:22Feb2013) to 4. Tamsulosin HCl 0.4 MG Oral Capsule; Therapy: (Recorded:22Feb2013) to 5. Zocor 10 MG Oral Tablet; Therapy: (Recorded:20Jan2011)  to  Allergies Medication  1. Chantix TABS 2. Codeine Derivatives 3. Erythromycin Base TABS  Family History Problems  1. Maternal history of  Arthritis V17.7 2. Maternal history of  Brain Tumor 3. Family history of  Death In The Family Father 17- lung ca 4. Family history of  Family Health Status - Mother's Age 34 5. Paternal history of  Lung Cancer V16.1  Social History Problems  1. Caffeine Use 2-3 per day 2. Marital History - Currently Married 3. Occupation: RN surgical ICU- [employer] 4. Tobacco Use 305.1 smoked for about 15 yrs & quit 4 ys ago Denied  5. History of  Alcohol Use  Review of Systems Genitourinary, constitutional, skin, eye, otolaryngeal, hematologic/lymphatic, cardiovascular, pulmonary, endocrine, musculoskeletal, gastrointestinal, neurological and psychiatric system(s) were reviewed and pertinent findings if present are noted.  Genitourinary: inguinal pain.  Gastrointestinal: flank pain and abdominal pain.    Vitals Vital Signs [Data Includes: Last 1 Day]  04Mar2013 08:19AM  BMI Calculated: 29.62 BSA Calculated: 2.06 Height: 5 ft 9 in Weight: 200 lb  Blood Pressure: 133 / 94, LUE, Sitting Temperature: 98.5 F, Oral Heart Rate: 69  Physical Exam Constitutional: Well nourished and well developed . No acute distress.  ENT:. The ears and nose are normal in appearance.  Neck: The appearance of the neck is normal and no neck mass is present.  Pulmonary: No respiratory distress and normal respiratory rhythm and effort.  Cardiovascular:. No peripheral edema.  Abdomen: No CVA tenderness.  Skin: Normal skin turgor, no visible rash and no visible skin  lesions.  Neuro/Psych:. Mood and affect are appropriate.    Results/Data Urine [Data Includes: Last 1 Day]   04Mar2013  COLOR YELLOW   APPEARANCE CLEAR   SPECIFIC GRAVITY <1.005   pH 6.0   GLUCOSE NEG mg/dL  BILIRUBIN NEG   KETONE NEG mg/dL  BLOOD TRACE   PROTEIN NEG mg/dL  UROBILINOGEN 0.2 mg/dL   NITRITE NEG   LEUKOCYTE ESTERASE NEG   SQUAMOUS EPITHELIAL/HPF RARE   WBC NONE SEEN WBC/hpf  RBC 0-3 RBC/hpf  BACTERIA NONE SEEN   CRYSTALS NONE SEEN   CASTS NONE SEEN    The following images/tracing/specimen were independently visualized: Marland Kitchen     KUB demonstrates the left distal ureteral stone to have slightly migrated distally, now present over the sacrum.  No other urolithiasis noted.  Pelvic phleboliths noted.  The following clinical lab reports were reviewed: Marland Kitchen  Urinalysis Selected Results  UA With REFLEX 04Mar2013 07:54AM Bruning, Ashlyn   Test Name Result Flag Reference  COLOR YELLOW  YELLOW  APPEARANCE CLEAR  CLEAR  SPECIFIC GRAVITY <1.005 L 1.005-1.030  pH 6.0  5.0-8.0  GLUCOSE NEG mg/dL  NEG  BILIRUBIN NEG  NEG  KETONE NEG mg/dL  NEG  BLOOD TRACE A NEG  PROTEIN NEG mg/dL  NEG  UROBILINOGEN 0.2 mg/dL  5.7-3.2  NITRITE NEG  NEG  LEUKOCYTE ESTERASE NEG  NEG  SQUAMOUS EPITHELIAL/HPF RARE  RARE  WBC NONE SEEN WBC/hpf  <4  RBC 0-3 RBC/hpf  <4  BACTERIA NONE SEEN  RARE  CRYSTALS NONE SEEN  NONE SEEN  CASTS NONE SEEN  NONE SEEN   Assessment Assessed  1. Ureteral Stone Left 592.1  Plan Health Maintenance (V70.0)  1. UA With REFLEX  Done: 04Mar2013 07:54AM Ureteral Stone (592.1)  2. Ondansetron HCl 4 MG Oral Tablet; TAKE 1 TABLET 4 times daily PRN nausea; Therapy:  22Feb2013 to (Last Rx:04Mar2013)  Requested for: 04Mar2013 3. Oxycodone-Acetaminophen 5-325 MG Oral Tablet; take 1 or 2 tablets q 4-6 hours prn pain;  Therapy: 22Feb2013 to (Last Rx:04Mar2013)  I reviewed the patient's chart, CT and KUB images. I discussed with the patient and his wife the nature risks and benefits of cystoscopy, left ureteroscopy, ureteral stent, laser lithotripsy including the alternatives such as nontreatment or repeat shockwave lithotripsy. We discussed failure to access the ureter and the need for a stent and staged procedure. We discussed the risk of bleeding infection and ureteral  injury among others.  We also discussed the likelihood of achieving the goals of the procedure and potential problems that might occur during the procedure or recuperation. All questions answered. The patient elects to proceed with left retrograde, ureteroscopy, laser lithotripsy and stent placement.   Add: I should add he has not passed the stone today. He has had no N/V, F/C or dysuria.

## 2011-08-15 ENCOUNTER — Encounter (HOSPITAL_BASED_OUTPATIENT_CLINIC_OR_DEPARTMENT_OTHER): Payer: Self-pay | Admitting: Anesthesiology

## 2011-08-15 ENCOUNTER — Ambulatory Visit (HOSPITAL_BASED_OUTPATIENT_CLINIC_OR_DEPARTMENT_OTHER): Payer: 59 | Admitting: Anesthesiology

## 2011-08-15 ENCOUNTER — Encounter (HOSPITAL_BASED_OUTPATIENT_CLINIC_OR_DEPARTMENT_OTHER): Payer: Self-pay | Admitting: *Deleted

## 2011-08-15 ENCOUNTER — Other Ambulatory Visit: Payer: Self-pay

## 2011-08-15 ENCOUNTER — Encounter (HOSPITAL_BASED_OUTPATIENT_CLINIC_OR_DEPARTMENT_OTHER)
Admission: RE | Disposition: A | Discharge status: Home / Self Care | Payer: Self-pay | Source: Ambulatory Visit | Attending: Urology

## 2011-08-15 ENCOUNTER — Ambulatory Visit (HOSPITAL_BASED_OUTPATIENT_CLINIC_OR_DEPARTMENT_OTHER)
Admission: RE | Admit: 2011-08-15 | Discharge: 2011-08-15 | Disposition: A | Discharge status: Home / Self Care | Payer: 59 | Source: Ambulatory Visit | Attending: Urology | Admitting: Urology

## 2011-08-15 DIAGNOSIS — F3289 Other specified depressive episodes: Secondary | ICD-10-CM | POA: Insufficient documentation

## 2011-08-15 DIAGNOSIS — N201 Calculus of ureter: Secondary | ICD-10-CM | POA: Insufficient documentation

## 2011-08-15 DIAGNOSIS — E7800 Pure hypercholesterolemia, unspecified: Secondary | ICD-10-CM | POA: Insufficient documentation

## 2011-08-15 DIAGNOSIS — F329 Major depressive disorder, single episode, unspecified: Secondary | ICD-10-CM | POA: Insufficient documentation

## 2011-08-15 DIAGNOSIS — R12 Heartburn: Secondary | ICD-10-CM | POA: Insufficient documentation

## 2011-08-15 DIAGNOSIS — J45909 Unspecified asthma, uncomplicated: Secondary | ICD-10-CM | POA: Insufficient documentation

## 2011-08-15 DIAGNOSIS — I10 Essential (primary) hypertension: Secondary | ICD-10-CM | POA: Insufficient documentation

## 2011-08-15 DIAGNOSIS — Z79899 Other long term (current) drug therapy: Secondary | ICD-10-CM | POA: Insufficient documentation

## 2011-08-15 HISTORY — DX: Atherosclerotic heart disease of native coronary artery without angina pectoris: I25.10

## 2011-08-15 HISTORY — DX: Malformation of coronary vessels: Q24.5

## 2011-08-15 HISTORY — DX: Personal history of other diseases of the circulatory system: Z86.79

## 2011-08-15 HISTORY — DX: Dysuria: R30.0

## 2011-08-15 HISTORY — DX: Personal history of other diseases of the digestive system: Z87.19

## 2011-08-15 HISTORY — DX: Calculus of ureter: N20.1

## 2011-08-15 HISTORY — PX: CYSTOSCOPY W/ URETERAL STENT PLACEMENT: SHX1429

## 2011-08-15 LAB — POCT I-STAT 4, (NA,K, GLUC, HGB,HCT): Hemoglobin: 15 g/dL (ref 13.0–17.0)

## 2011-08-15 SURGERY — CYSTOSCOPY, FLEXIBLE, WITH STENT REPLACEMENT
Anesthesia: General | Site: Ureter | Laterality: Left | Wound class: Clean Contaminated

## 2011-08-15 MED ORDER — OXYCODONE-ACETAMINOPHEN 5-325 MG PO TABS
2.0000 | ORAL_TABLET | ORAL | Status: DC | PRN
  Administered 2011-08-15 (×2): 1 via ORAL

## 2011-08-15 MED ORDER — KETOROLAC TROMETHAMINE 30 MG/ML IJ SOLN
INTRAMUSCULAR | Status: DC | PRN
  Administered 2011-08-15: 30 mg via INTRAVENOUS

## 2011-08-15 MED ORDER — LACTATED RINGERS IV SOLN
INTRAVENOUS | Status: DC
  Administered 2011-08-15 (×2): via INTRAVENOUS

## 2011-08-15 MED ORDER — CIPROFLOXACIN IN D5W 400 MG/200ML IV SOLN
400.0000 mg | INTRAVENOUS | Status: AC
  Administered 2011-08-15: 400 mg via INTRAVENOUS

## 2011-08-15 MED ORDER — PHENAZOPYRIDINE HCL 200 MG PO TABS
200.0000 mg | ORAL_TABLET | Freq: Three times a day (TID) | ORAL | Status: AC
  Administered 2011-08-15: 200 mg via ORAL

## 2011-08-15 MED ORDER — MIDAZOLAM HCL 5 MG/5ML IJ SOLN
INTRAMUSCULAR | Status: DC | PRN
  Administered 2011-08-15: 2 mg via INTRAVENOUS

## 2011-08-15 MED ORDER — ONDANSETRON HCL 4 MG/2ML IJ SOLN
INTRAMUSCULAR | Status: DC | PRN
  Administered 2011-08-15: 4 mg via INTRAVENOUS

## 2011-08-15 MED ORDER — PROMETHAZINE HCL 25 MG/ML IJ SOLN
6.2500 mg | INTRAMUSCULAR | Status: DC | PRN
  Administered 2011-08-15: 6.25 mg via INTRAVENOUS

## 2011-08-15 MED ORDER — SODIUM CHLORIDE 0.9 % IR SOLN
Status: DC | PRN
  Administered 2011-08-15: 6000 mL

## 2011-08-15 MED ORDER — FENTANYL CITRATE 0.05 MG/ML IJ SOLN
INTRAMUSCULAR | Status: DC | PRN
  Administered 2011-08-15 (×3): 25 ug via INTRAVENOUS
  Administered 2011-08-15: 100 ug via INTRAVENOUS
  Administered 2011-08-15: 25 ug via INTRAVENOUS

## 2011-08-15 MED ORDER — LACTATED RINGERS IV SOLN
INTRAVENOUS | Status: DC | PRN
  Administered 2011-08-15: 08:00:00 via INTRAVENOUS

## 2011-08-15 MED ORDER — IOHEXOL 350 MG/ML SOLN
INTRAVENOUS | Status: DC | PRN
  Administered 2011-08-15: 50 mL

## 2011-08-15 MED ORDER — CIPROFLOXACIN HCL 500 MG PO TABS: 500.0000 mg | ORAL_TABLET | Freq: Two times a day (BID) | ORAL | Status: AC

## 2011-08-15 MED ORDER — MORPHINE SULFATE 2 MG/ML IJ SOLN
2.0000 mg | INTRAMUSCULAR | Status: DC | PRN
  Administered 2011-08-15: 2 mg via INTRAVENOUS

## 2011-08-15 MED ORDER — DEXAMETHASONE SODIUM PHOSPHATE 4 MG/ML IJ SOLN
INTRAMUSCULAR | Status: DC | PRN
  Administered 2011-08-15: 4 mg via INTRAVENOUS

## 2011-08-15 MED ORDER — BELLADONNA ALKALOIDS-OPIUM 16.2-60 MG RE SUPP
RECTAL | Status: DC | PRN
  Administered 2011-08-15: 1 via RECTAL

## 2011-08-15 MED ORDER — PROPOFOL 10 MG/ML IV EMUL
INTRAVENOUS | Status: DC | PRN
  Administered 2011-08-15: 200 mg via INTRAVENOUS

## 2011-08-15 MED ORDER — FENTANYL CITRATE 0.05 MG/ML IJ SOLN
25.0000 ug | INTRAMUSCULAR | Status: DC | PRN
  Administered 2011-08-15 (×2): 25 ug via INTRAVENOUS
  Administered 2011-08-15: 50 ug via INTRAVENOUS

## 2011-08-15 MED ORDER — LIDOCAINE HCL (CARDIAC) 20 MG/ML IV SOLN
INTRAVENOUS | Status: DC | PRN
  Administered 2011-08-15: 100 mg via INTRAVENOUS

## 2011-08-15 SURGICAL SUPPLY — 39 items
ADAPTER CATH URET PLST 4-6FR (CATHETERS) ×2 IMPLANT
ADPR CATH URET STRL DISP 4-6FR (CATHETERS) ×2
BAG DRAIN URO-CYSTO SKYTR STRL (DRAIN) ×3 IMPLANT
BAG DRN UROCATH (DRAIN) ×2
BASKET LASER NITINOL 1.9FR (BASKET) IMPLANT
BASKET STNLS GEMINI 4WIRE 3FR (BASKET) ×2 IMPLANT
BASKET ZERO TIP NITINOL 2.4FR (BASKET) IMPLANT
BRUSH URET BIOPSY 3F (UROLOGICAL SUPPLIES) IMPLANT
BSKT STON RTRVL 120 1.9FR (BASKET)
BSKT STON RTRVL GEM 120X11 3FR (BASKET) ×2
BSKT STON RTRVL ZERO TP 2.4FR (BASKET)
CANISTER SUCT LVC 12 LTR MEDI- (MISCELLANEOUS) ×2 IMPLANT
CATH INTERMIT  6FR 70CM (CATHETERS) IMPLANT
CATH URET 5FR 28IN CONE TIP (BALLOONS)
CATH URET 5FR 28IN OPEN ENDED (CATHETERS) ×2 IMPLANT
CATH URET 5FR 70CM CONE TIP (BALLOONS) IMPLANT
CLOTH BEACON ORANGE TIMEOUT ST (SAFETY) ×3 IMPLANT
DRAPE CAMERA CLOSED 9X96 (DRAPES) IMPLANT
DRSG TEGADERM 2-3/8X2-3/4 SM (GAUZE/BANDAGES/DRESSINGS) ×2 IMPLANT
ELECT REM PT RETURN 9FT ADLT (ELECTROSURGICAL)
ELECTRODE REM PT RTRN 9FT ADLT (ELECTROSURGICAL) IMPLANT
GLOVE BIO SURGEON STRL SZ 6.5 (GLOVE) ×4 IMPLANT
GLOVE BIO SURGEON STRL SZ7.5 (GLOVE) ×3 IMPLANT
GLOVE ECLIPSE 6.0 STRL STRAW (GLOVE) ×4 IMPLANT
GOWN PREVENTION PLUS LG XLONG (DISPOSABLE) ×3 IMPLANT
GOWN STRL REIN XL XLG (GOWN DISPOSABLE) ×3 IMPLANT
GOWN SURGICAL LARGE (GOWNS) IMPLANT
GUIDEWIRE 0.038 PTFE COATED (WIRE) ×3 IMPLANT
GUIDEWIRE ANG ZIPWIRE 038X150 (WIRE) IMPLANT
GUIDEWIRE STR DUAL SENSOR (WIRE) ×2 IMPLANT
IV NS IRRIG 3000ML ARTHROMATIC (IV SOLUTION) ×6 IMPLANT
KIT BALLIN UROMAX 15FX10 (LABEL) IMPLANT
KIT BALLN UROMAX 15FX4 (MISCELLANEOUS) IMPLANT
KIT BALLN UROMAX 26 75X4 (MISCELLANEOUS)
PACK CYSTOSCOPY (CUSTOM PROCEDURE TRAY) ×3 IMPLANT
SET HIGH PRES BAL DIL (LABEL)
SHEATH URET ACCESS 12FR/35CM (UROLOGICAL SUPPLIES) IMPLANT
SHEATH URET ACCESS 12FR/55CM (UROLOGICAL SUPPLIES) IMPLANT
SYRINGE IRR TOOMEY STRL 70CC (SYRINGE) IMPLANT

## 2011-08-15 NOTE — Discharge Instructions (Signed)
Ureteral Stent A ureteral stent is a soft plastic tube with multiple holes. The stent is inserted into a ureter to help drain urine from the kidney into the bladder. The tube has a coil on each end to keep it from falling out. One end stays in the kidney. The other end stays in the bladder. A stent cannot be seen from the outside. Usually it does not keep you from going about normal routines. A ureteral stent is used to bypass a blockage in your kidney or ureter. This blockage can be caused by kidney stones, scar tissue, pregnancy, or other causes. It can also be used during treatment to remove a kidney stone or to let a ureter heal after surgery. The stent allows urine to drain from the kidney into the bladder. It is most often taken out after the blockage has been removed or the ureter has healed.  HOME CARE INSTRUCTIONS   While the stent is in place, you may feel some discomfort. Certain movements may trigger pain or a feeling that you need to urinate. Your caregiver may give you pain medication. Only take over-the-counter or prescription medicines for pain, discomfort, or fever as directed by your caregiver. Do not take aspirin as this can make bleeding worse.   You may be given medications to prevent infection or bladder spasms. Be sure to take all medications as directed.   Drink plenty of fluids.   You may have small amounts of bleeding causing your urine to be slightly red. This is nothing to be concerned about.  REMOVAL OF THE STENT ***Remove the stent by pulling the string until the entire stent comes out. Remove on Thursday, Aug 17, 2011 SEEK IMMEDIATE MEDICAL CARE IF:   Your urine is dark red or has blood clots.   You are incontinent (leaking urine).   You have an oral temperature above 102 F (38.9 C), chills, nausea (feeling sick to your stomach), or vomiting.   Your pain is not relieved by pain medication. Do not take aspirin as this can make bleeding worse.   The end of the  stent comes out of the urethra.  Document Released: 05/26/2000 Document Revised: 05/18/2011 Document Reviewed: 05/25/2008 [facility] Patient Information 2012 [place], [place].

## 2011-08-15 NOTE — Anesthesia Procedure Notes (Signed)
Procedure Name: LMA Insertion Date/Time: 08/15/2011 9:50 AM Performed by: Iline Oven Pre-anesthesia Checklist: Patient identified, Emergency Drugs available, Suction available and Patient being monitored Patient Re-evaluated:Patient Re-evaluated prior to inductionOxygen Delivery Method: Circle System Utilized Preoxygenation: Pre-oxygenation with 100% oxygen Intubation Type: IV induction Ventilation: Mask ventilation without difficulty LMA: LMA with gastric port inserted LMA Size: 4.0 Number of attempts: 1 Placement Confirmation: positive ETCO2 Tube secured with: Tape Dental Injury: Teeth and Oropharynx as per pre-operative assessment

## 2011-08-15 NOTE — Transfer of Care (Signed)
Immediate Anesthesia Transfer of Care Note  Patient: [name]  Procedure(s) Performed: Procedure(s) (LRB): CYSTOSCOPY WITH STENT REPLACEMENT (Left)  Patient Location: PACU  Anesthesia Type: General  Level of Consciousness: awake, sedated, patient cooperative and responds to stimulation  Airway & Oxygen Therapy: Patient Spontanous Breathing and Patient connected to face mask oxygen  Post-op Assessment: Report given to PACU RN, Post -op Vital signs reviewed and stable and Patient moving all extremities  Post vital signs: Reviewed and stable  Complications: No apparent anesthesia complications

## 2011-08-15 NOTE — Anesthesia Preprocedure Evaluation (Addendum)
Anesthesia Evaluation  Patient identified by MRN, date of birth, ID band Patient awake    Reviewed: Allergy & Precautions, H&P , NPO status , Patient's Chart, lab work & pertinent test results  Airway Mallampati: II TM Distance: >3 FB Neck ROM: Full    Dental No notable dental hx.    Pulmonary shortness of breath, asthma ,  breath sounds clear to auscultation  Pulmonary exam normal       Cardiovascular hypertension, Pt. on home beta blockers and Pt. on medications + angina + CAD Rhythm:Regular Rate:Normal  ECG: sinus brady (45) with early repolarization. 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   Neuro/Psych negative neurological ROS  negative psych ROS   GI/Hepatic Neg liver ROS, hiatal hernia, GERD-  Medicated,  Endo/Other  negative endocrine ROS  Renal/GU negative Renal ROS  negative genitourinary   Musculoskeletal negative musculoskeletal ROS (+)   Abdominal   Peds negative pediatric ROS (+)  Hematology negative hematology ROS (+)   Anesthesia Other Findings   Reproductive/Obstetrics negative OB ROS                          Anesthesia Physical Anesthesia Plan  ASA: III  Anesthesia Plan: General   Post-op Pain Management:    Induction: Intravenous  Airway Management Planned: LMA  Additional Equipment:   Intra-op Plan:   Post-operative Plan: Extubation in OR  Informed Consent: I have reviewed the patients History and Physical, chart, labs and discussed the procedure including the risks, benefits and alternatives for the proposed anesthesia with the patient or authorized representative who has indicated his/her understanding and acceptance.   Dental advisory given  Plan Discussed with: CRNA  Anesthesia Plan Comments:         Anesthesia Quick Evaluation

## 2011-08-15 NOTE — Progress Notes (Signed)
After administration of pnenergan 6.25mg  IV for nausea, no emesis. Pt became diaphoretic, c/o brief episode of mid sternal chest tightness, HR 57, B/P 86/46, RR 26, O2 sats  > 95% & pt responsive. IV fluids wide open, placed in trendelenberg, monitoring VS q 2"-5". Dr Council Mechanic @ bedside. New order noted for EKG. Dr Council Mechanic aware of EKG results.

## 2011-08-15 NOTE — Anesthesia Postprocedure Evaluation (Signed)
  Anesthesia Post-op Note  Patient: [name]  Procedure(s) Performed: Procedure(s) (LRB): CYSTOSCOPY WITH STENT REPLACEMENT (Left)  Patient Location: PACU  Anesthesia Type: General  Level of Consciousness: awake and alert   Airway and Oxygen Therapy: Patient Spontanous Breathing  Post-op Pain: mild  Post-op Assessment: Post-op Vital signs reviewed, Patient's Cardiovascular Status Stable, Respiratory Function Stable, Patent Airway and No signs of Nausea or vomiting  Post-op Vital Signs: stable  Complications: No apparent anesthesia complications. He had momentary chest tightness after phenergan dose. ECG unimpressive. Symptoms resolved quickly.

## 2011-08-15 NOTE — Brief Op Note (Signed)
08/15/2011  10:27 AM  PATIENT:  [name]  41 y.o. male  PRE-OPERATIVE DIAGNOSIS:  left distal ureteral stone  POST-OPERATIVE DIAGNOSIS:  left distal ureteral stone  PROCEDURE:  Procedure(s) (LRB): CYSTOSCOPY WITH LEFT URETEROSCOPY, STONE BASKET EXTRACTION AND STENT REPLACEMENT (Left)  SURGEON:  Surgeon(s) and Role:    * Antony Haste, MD - Primary   ANESTHESIA: Gen.  EBL: Minimal  DRAINS: 6 x 26 cm left ureteral stent with string   SPECIMEN: Left ureteral stone sent to office lab  DICTATION: [phone]  PLAN OF CARE: Discharge to home after PACU  PATIENT DISPOSITION:  PACU - hemodynamically stable.   Delay start of Pharmacological VTE agent (>24hrs) due to surgical blood loss or risk of bleeding: not applicable

## 2011-08-15 NOTE — Progress Notes (Signed)
Pt's VSS & denies c/p or nausea. Pt placed in semi- fowler's. No acute distress noted.

## 2011-08-15 NOTE — Progress Notes (Addendum)
Dr Mena Goes notified of pt's burning urethral meatus & Left flank pain. New order noted to give pyridium 200mg  po now

## 2011-08-16 ENCOUNTER — Emergency Department (HOSPITAL_COMMUNITY)
Admission: EM | Admit: 2011-08-16 | Discharge: 2011-08-16 | Disposition: A | Discharge status: Home / Self Care | Payer: 59 | Attending: Emergency Medicine | Admitting: Emergency Medicine

## 2011-08-16 ENCOUNTER — Emergency Department (HOSPITAL_COMMUNITY): Payer: 59

## 2011-08-16 ENCOUNTER — Encounter (HOSPITAL_BASED_OUTPATIENT_CLINIC_OR_DEPARTMENT_OTHER): Payer: Self-pay | Admitting: Urology

## 2011-08-16 DIAGNOSIS — Z9889 Other specified postprocedural states: Secondary | ICD-10-CM | POA: Insufficient documentation

## 2011-08-16 DIAGNOSIS — K5730 Diverticulosis of large intestine without perforation or abscess without bleeding: Secondary | ICD-10-CM | POA: Insufficient documentation

## 2011-08-16 DIAGNOSIS — R112 Nausea with vomiting, unspecified: Secondary | ICD-10-CM | POA: Insufficient documentation

## 2011-08-16 DIAGNOSIS — K8020 Calculus of gallbladder without cholecystitis without obstruction: Secondary | ICD-10-CM | POA: Insufficient documentation

## 2011-08-16 DIAGNOSIS — N509 Disorder of male genital organs, unspecified: Secondary | ICD-10-CM | POA: Insufficient documentation

## 2011-08-16 DIAGNOSIS — N200 Calculus of kidney: Secondary | ICD-10-CM | POA: Insufficient documentation

## 2011-08-16 DIAGNOSIS — R300 Dysuria: Secondary | ICD-10-CM | POA: Insufficient documentation

## 2011-08-16 DIAGNOSIS — M5126 Other intervertebral disc displacement, lumbar region: Secondary | ICD-10-CM | POA: Insufficient documentation

## 2011-08-16 DIAGNOSIS — I10 Essential (primary) hypertension: Secondary | ICD-10-CM | POA: Insufficient documentation

## 2011-08-16 DIAGNOSIS — I2510 Atherosclerotic heart disease of native coronary artery without angina pectoris: Secondary | ICD-10-CM | POA: Insufficient documentation

## 2011-08-16 DIAGNOSIS — R109 Unspecified abdominal pain: Secondary | ICD-10-CM | POA: Insufficient documentation

## 2011-08-16 LAB — URINALYSIS, ROUTINE W REFLEX MICROSCOPIC
Glucose, UA: NEGATIVE mg/dL
Ketones, ur: NEGATIVE mg/dL
Protein, ur: 30 mg/dL — AB
pH: 6.5 (ref 5.0–8.0)

## 2011-08-16 LAB — BASIC METABOLIC PANEL
BUN: 14 mg/dL (ref 6–23)
CO2: 23 mEq/L (ref 19–32)
Calcium: 9.8 mg/dL (ref 8.4–10.5)
Chloride: 101 mEq/L (ref 96–112)
Creatinine, Ser: 1.11 mg/dL (ref 0.50–1.35)
Glucose, Bld: 137 mg/dL — ABNORMAL HIGH (ref 70–99)

## 2011-08-16 LAB — CBC
HCT: 40.7 % (ref 39.0–52.0)
Hemoglobin: 15 g/dL (ref 13.0–17.0)
MCH: 31.4 pg (ref 26.0–34.0)
MCV: 85.3 fL (ref 78.0–100.0)
Platelets: 221 10*3/uL (ref 150–400)
RBC: 4.77 MIL/uL (ref 4.22–5.81)
WBC: 9.4 10*3/uL (ref 4.0–10.5)

## 2011-08-16 LAB — URINE MICROSCOPIC-ADD ON

## 2011-08-16 LAB — DIFFERENTIAL
Basophils Relative: 0 % (ref 0–1)
Eosinophils Relative: 1 % (ref 0–5)
Lymphs Abs: 2.4 10*3/uL (ref 0.7–4.0)
Monocytes Absolute: 0.6 10*3/uL (ref 0.1–1.0)
Monocytes Relative: 6 % (ref 3–12)
Neutro Abs: 6.3 10*3/uL (ref 1.7–7.7)

## 2011-08-16 MED ORDER — OXYCODONE-ACETAMINOPHEN 5-325 MG PO TABS: 2.0000 | ORAL_TABLET | ORAL | Status: AC | PRN

## 2011-08-16 MED ORDER — SODIUM CHLORIDE 0.9 % IV BOLUS (SEPSIS)
1000.0000 mL | Freq: Once | INTRAVENOUS | Status: AC
  Administered 2011-08-16: 1000 mL via INTRAVENOUS

## 2011-08-16 MED ORDER — ONDANSETRON HCL 4 MG PO TABS: 4.0000 mg | ORAL_TABLET | Freq: Four times a day (QID) | ORAL | Status: AC

## 2011-08-16 MED ORDER — KETOROLAC TROMETHAMINE 30 MG/ML IJ SOLN
30.0000 mg | Freq: Once | INTRAMUSCULAR | Status: AC
  Administered 2011-08-16: 30 mg via INTRAVENOUS
  Filled 2011-08-16: qty 1

## 2011-08-16 MED ORDER — IBUPROFEN 800 MG PO TABS: 800.0000 mg | ORAL_TABLET | Freq: Three times a day (TID) | ORAL | Status: AC

## 2011-08-16 MED ORDER — HYDROMORPHONE HCL PF 1 MG/ML IJ SOLN
1.0000 mg | Freq: Once | INTRAMUSCULAR | Status: AC
  Administered 2011-08-16: 1 mg via INTRAVENOUS
  Filled 2011-08-16: qty 1

## 2011-08-16 MED ORDER — ONDANSETRON HCL 4 MG/2ML IJ SOLN
4.0000 mg | Freq: Once | INTRAMUSCULAR | Status: AC
  Administered 2011-08-16: 4 mg via INTRAVENOUS
  Filled 2011-08-16: qty 2

## 2011-08-16 NOTE — Op Note (Signed)
[name], [name]             ACCOUNT NO.:  [id number]  MEDICAL RECORD NO.:  [id number]  LOCATION:                                FACILITY:  [facility]  PHYSICIAN:  Jerilee Field, MD   DATE OF BIRTH:  1970/11/27  DATE OF PROCEDURE:  08/15/2011 DATE OF DISCHARGE:                              OPERATIVE REPORT   PREOPERATIVE DIAGNOSIS:  Left distal ureteral stone.  POSTOPERATIVE DIAGNOSIS:  Left distal ureteral stone.  PROCEDURE:  Cystoscopy, left ureteroscopy, stone basket extraction and stent placement.  SURGEON:  Jerilee Field, MD.  FINDINGS:  On cystoscopy, the stone was visible through the left ureteral orifice.  On the left intramural ureter, basket attempt to extract it cystoscopically was unsuccessful due to tight stone impaction.  It was removed intact with the ureteroscope.  After stone removal, ureteroscopy revealed a normal distal ureter as far up as the mid pelvis with no other stones or injury, and some inflammation and bleeding in the intramural left ureter where the stone had been impacted.  DESCRIPTION OF THE PROCEDURE:  After consent was obtained, the patient brought to the operating room.  A time-out was performed to confirm the patient and procedure.  He is placed in lithotomy position.  SCDs and antibiotics have been given.  On exam under anesthesia, he had a normal penis and normal testicles without masses.  He was then prepped and draped in the usual fashion.  A cystoscope was passed per urethra.  The stone was visible within the left ureteral orifice.  A Gemini basket was attempted to place adjacent to the stone, but this was not successful, therefore a Sensor wire was advanced up the left ureter and coiled in the left kidney.  A rigid ureteroscope was then advanced into the left ureter and in the intramural ureter, the stone was found.  The basket was then passed past the stone deployed, the stone was then grasped with the basket and removed intact.   It will be sent to pathology with the office labs.  The ureteroscope was then advanced back into the left intramural ureter.  There was some edema and mild bleeding from the mucosa where the stone had been impacted, but otherwise was normal. Once into the left ureter, it appeared normal.  There were no other stone fragments.  I examined up several centimeters into the ureter and it appeared normal, and the wire was in the proper lumen of the ureter. Therefore the scope was removed.  The wire was back-loaded on the cystoscope and a 6 cm x 26 cm left ureteral stent was placed.  The wire was removed and a good coil was seen in the kidney and the bladder. Patient's bladder was drained and the scope was removed.  The string was secured to the patient.  He was then awakened, taken to the recovery room in stable condition.  ESTIMATED BLOOD LOSS:  Minimal.  COMPLICATIONS:  None.  DRAINS:  6 cm x 26 cm left ureteral stent.  DISPOSITION:  Patient is stable to PACU.  He will be discharged to home after he recovers.          ______________________________ Jerilee Field, MD  ME/MEDQ  D:  08/15/2011  T:  08/16/2011  Job:  [id number]

## 2011-08-16 NOTE — ED Notes (Signed)
Pt states he had a kidney stone extracted yesterday and was sent home with a stent  Pt states he has been taking percocet for pain  Pt states he was told to remove stent this evening around 1730  Pt states he has had pain since then  Rating pain 10/10 at this time with nausea

## 2011-08-16 NOTE — ED Provider Notes (Signed)
History     CSN: [id number]  Arrival date & time 08/16/11  2024   First MD Initiated Contact with Patient 08/16/11 2040      No chief complaint on file.   (Consider location/radiation/quality/duration/timing/severity/associated sxs/prior treatment) HPI Comments: Patient presents with severe left flank pain with nausea and vomiting it radiates to his groin ever since having a stone extracted yesterday by Dr. Mena Goes.  The pain is sharp and colicky and radiates to his left flank and groin. Associated with dysuria. He's been taking Percocet every 4 hours with ibuprofen with minimal relief. He denies any fever, chest pain, shortness of breath. He removed the ureteral stent this evening in an attempt to see the pain would improve admitted for a short time and acutely got much worse. The pain radiates to his left testicle and is only mildly improved medications.  The history is provided by the patient and the spouse.    Past Medical History  Diagnosis Date  . Hypertension   . GERD (gastroesophageal reflux disease)   . Asthma   . Shortness of breath     with excertion  . Depression   . H/O hiatal hernia   . History of angina pectoris   . Myocardial Bridge MID LAD  . Coronary artery disease CARDIOLOGIST - DR Verdis Prime--- LAST VISIT OCT 2012    DENIES S & S  . Kidney calculi   . Left ureteral calculus   . Dysuria     Past Surgical History  Procedure Date  . Cardiac catheterization 01-07-2010-- DR Verdis Prime    NORMAL LVF/ PATENT CORONARY ARTERIES/ MYOCARDIAL BRIDGE MID LAD  . Vericocelectomy 1992  . Extracorporeal shock wave lithotripsy 08-07-11    LEFT  . Cystoscopy w/ ureteral stent placement 08/15/2011    Procedure: CYSTOSCOPY WITH STENT REPLACEMENT;  Surgeon: Antony Haste, MD;  Location: [facility];  Service: Urology;  Laterality: Left;  stone basket extraction left, left ureteroscopy    History reviewed. No pertinent family history.  History    Substance Use Topics  . Smoking status: Former Smoker -- 3.0 packs/day for 10 years    Types: Cigarettes, Cigars    Quit date: 08/13/2005  . Smokeless tobacco: Never Used  . Alcohol Use: No      Review of Systems  Constitutional: Positive for activity change and appetite change. Negative for fever.  HENT: Negative for congestion and rhinorrhea.   Eyes: Negative for visual disturbance.  Respiratory: Negative for cough, chest tightness and shortness of breath.   Gastrointestinal: Positive for nausea, vomiting and abdominal pain.  Genitourinary: Positive for dysuria, hematuria, flank pain, difficulty urinating and testicular pain.  Musculoskeletal: Positive for back pain.  Skin: Negative for rash.  Neurological: Negative for dizziness and headaches.    Allergies  Ansaid; Chantix; Codeine; and Erythromycin  Home Medications   Current Outpatient Rx  Name Route Sig Dispense Refill  . ALBUTEROL SULFATE HFA 108 (90 BASE) MCG/ACT IN AERS Inhalation Inhale 2 puffs into the lungs every 6 (six) hours as needed. Wheezing     . ASPIRIN 325 MG PO TBEC Oral Take 325 mg by mouth every morning.     Marland Kitchen CIPROFLOXACIN HCL 500 MG PO TABS Oral Take 1 tablet (500 mg total) by mouth 2 (two) times daily. 6 tablet 0  . COLCHICINE 0.6 MG PO TABS Oral Take 0.6 mg by mouth daily.    Marland Kitchen ESOMEPRAZOLE MAGNESIUM 40 MG PO CPDR Oral Take 40 mg by mouth daily  before breakfast.    . FLUTICASONE-SALMETEROL 250-50 MCG/DOSE IN AEPB Inhalation Inhale 1 puff into the lungs 2 (two) times daily.     Marland Kitchen GLUCOSAMINE-CHONDROITIN 500-400 MG PO TABS Oral Take 1 tablet by mouth 2 (two) times daily.    . IBUPROFEN 800 MG PO TABS Oral Take 800 mg by mouth every 6 (six) hours as needed. For pain    . METOPROLOL SUCCINATE ER 50 MG PO TB24 Oral Take 50 mg by mouth every morning. Take with or immediately following a meal.    . NITROGLYCERIN 0.4 MG/SPRAY TL SOLN Sublingual Place 1 spray under the tongue every 5 (five) minutes as  needed. Chest pain     . OXYCODONE-ACETAMINOPHEN 5-325 MG PO TABS Oral Take 1-2 tablets by mouth every 6 (six) hours as needed. Pain 30 tablet 0  . SIMVASTATIN 10 MG PO TABS Oral Take 10 mg by mouth daily.     Marland Kitchen TAMSULOSIN HCL 0.4 MG PO CAPS Oral Take 0.4 mg by mouth.    . IBUPROFEN 800 MG PO TABS Oral Take 1 tablet (800 mg total) by mouth 3 (three) times daily. 21 tablet 0  . ONDANSETRON HCL 4 MG PO TABS Oral Take 1 tablet (4 mg total) by mouth every 6 (six) hours. 12 tablet 0  . OXYCODONE-ACETAMINOPHEN 5-325 MG PO TABS Oral Take 2 tablets by mouth every 4 (four) hours as needed for pain. 15 tablet 0    BP 124/69  Pulse 72  Temp(Src) 97.6 F (36.4 C) (Oral)  Resp 15  SpO2 94%  Physical Exam  Constitutional: He is oriented to person, place, and time. He appears well-developed and well-nourished. No distress.       Uncomfortable  HENT:  Head: Normocephalic and atraumatic.  Mouth/Throat: Oropharynx is clear and moist.  Eyes: Conjunctivae are normal. Pupils are equal, round, and reactive to light.  Neck: Normal range of motion. Neck supple.  Cardiovascular: Normal rate, regular rhythm and normal heart sounds.   Pulmonary/Chest: Effort normal and breath sounds normal. No respiratory distress.  Abdominal: Soft. There is tenderness. There is no rebound and no guarding.       Left upper quadrant and left lower quadrant pain without guarding.  Genitourinary:       Left testicle tenderness  Musculoskeletal: He exhibits tenderness.       Left CVA tenderness  Neurological: He is alert and oriented to person, place, and time.  Skin: Skin is warm.    ED Course  Procedures (including critical care time)  Labs Reviewed  CBC - Abnormal; Notable for the following:    MCHC 36.9 (*)    All other components within normal limits  BASIC METABOLIC PANEL - Abnormal; Notable for the following:    Potassium 3.4 (*)    Glucose, Bld 137 (*)    GFR calc non Af Amer 82 (*)    All other components  within normal limits  URINALYSIS, ROUTINE W REFLEX MICROSCOPIC - Abnormal; Notable for the following:    Color, Urine ORANGE (*) BIOCHEMICALS MAY BE AFFECTED BY COLOR   APPearance CLOUDY (*)    Hgb urine dipstick LARGE (*)    Protein, ur 30 (*)    Nitrite POSITIVE (*)    Leukocytes, UA TRACE (*)    All other components within normal limits  URINE MICROSCOPIC-ADD ON - Abnormal; Notable for the following:    Squamous Epithelial / LPF FEW (*)    All other components within normal limits  DIFFERENTIAL  Ct Abdomen Pelvis Wo Contrast  08/16/2011  *RADIOLOGY REPORT*  Clinical Data:  Post kidney stone extraction and ureteral stent removal, pain  CT ABDOMEN AND PELVIS WITHOUT CONTRAST  Technique:  Multidetector CT imaging of the abdomen and pelvis was performed following the standard protocol without intravenous contrast. Sagittal and coronal MPR images reconstructed from axial data set.  Comparison: 08/03/2011  Findings: Linear subsegmental atelectasis at both lung bases. Cholelithiasis. Large cyst upper pole left kidney 5.8 x 4.7 x 5.1 cm. 5 mm nonobstructing calculus mid left kidney. Mild left hydronephrosis and hydroureter. Bladder incompletely distended with bladder wall thickening, though this can be from an underdistension artifact. However there appears be slightly greater bladder wall thickening at the region of the left ureterovesicle junction, likely related to preceding cystoscopic procedure. No ureteral calcification identified.  Within limits of a nonenhanced exam no focal abnormalities of the liver, spleen, pancreas, adrenal glands, or right kidney. Food debris in stomach. Sigmoid diverticulosis without evidence of diverticulitis. Normal appendix. Small bowel loops unremarkable. No mass, adenopathy, or free fluid. Dilated internal inguinal rings bilaterally without hernias. No acute osseous findings. Large central disc herniation L5-S1. Tiny umbilical hernia containing fat.  IMPRESSION: Left  hydronephrosis and hydroureter with mild bladder wall thickening at the ureterovesicle junction, question related to recent cystoscopic procedure. Previously identified left ureteral calculus no longer seen. Large cyst and nonobstructing 5 mm calculus noted left kidney. Cholelithiasis. Sigmoid diverticulosis. Bibasilar atelectasis. Central disc herniation L5-S1.  Original Report Authenticated By: Lollie Marrow, M.D.     1. Flank pain       MDM  Flank pain and nausea and vomiting status post kidney stone extraction yesterday. Pain not well controlled at home. UA, labs, IV hydration, narcotics and antiemetics. Will obtain repeat imaging and discuss with urology.  Pain much improved after IV narcotics. Urinalysis shows hematuria without infection consistent with recent urological procedure CT scan shows left hydronephrosis which also is expected given recent procedure.  D/w Dr. Laverle Patter, on call urology who states ureteral edema after procedure can cause pseudoobstruction.  He believes patient removed stent too early.  Does not recommend additional antibiotics (patient still on cipro for 2 more days).  With pain controlled, patient can follow up in clinic.     Glynn Octave, MD 08/16/11 2352

## 2011-08-16 NOTE — ED Notes (Signed)
Patient returned from CT

## 2012-07-08 IMAGING — CR DG CHEST 2V
2 series · 2 of 2 positions shown · non-contrast
Comparison: 06/04/2009

CLINICAL DATA: Chest pain.

CHEST - 2 VIEW

[w chest pa]
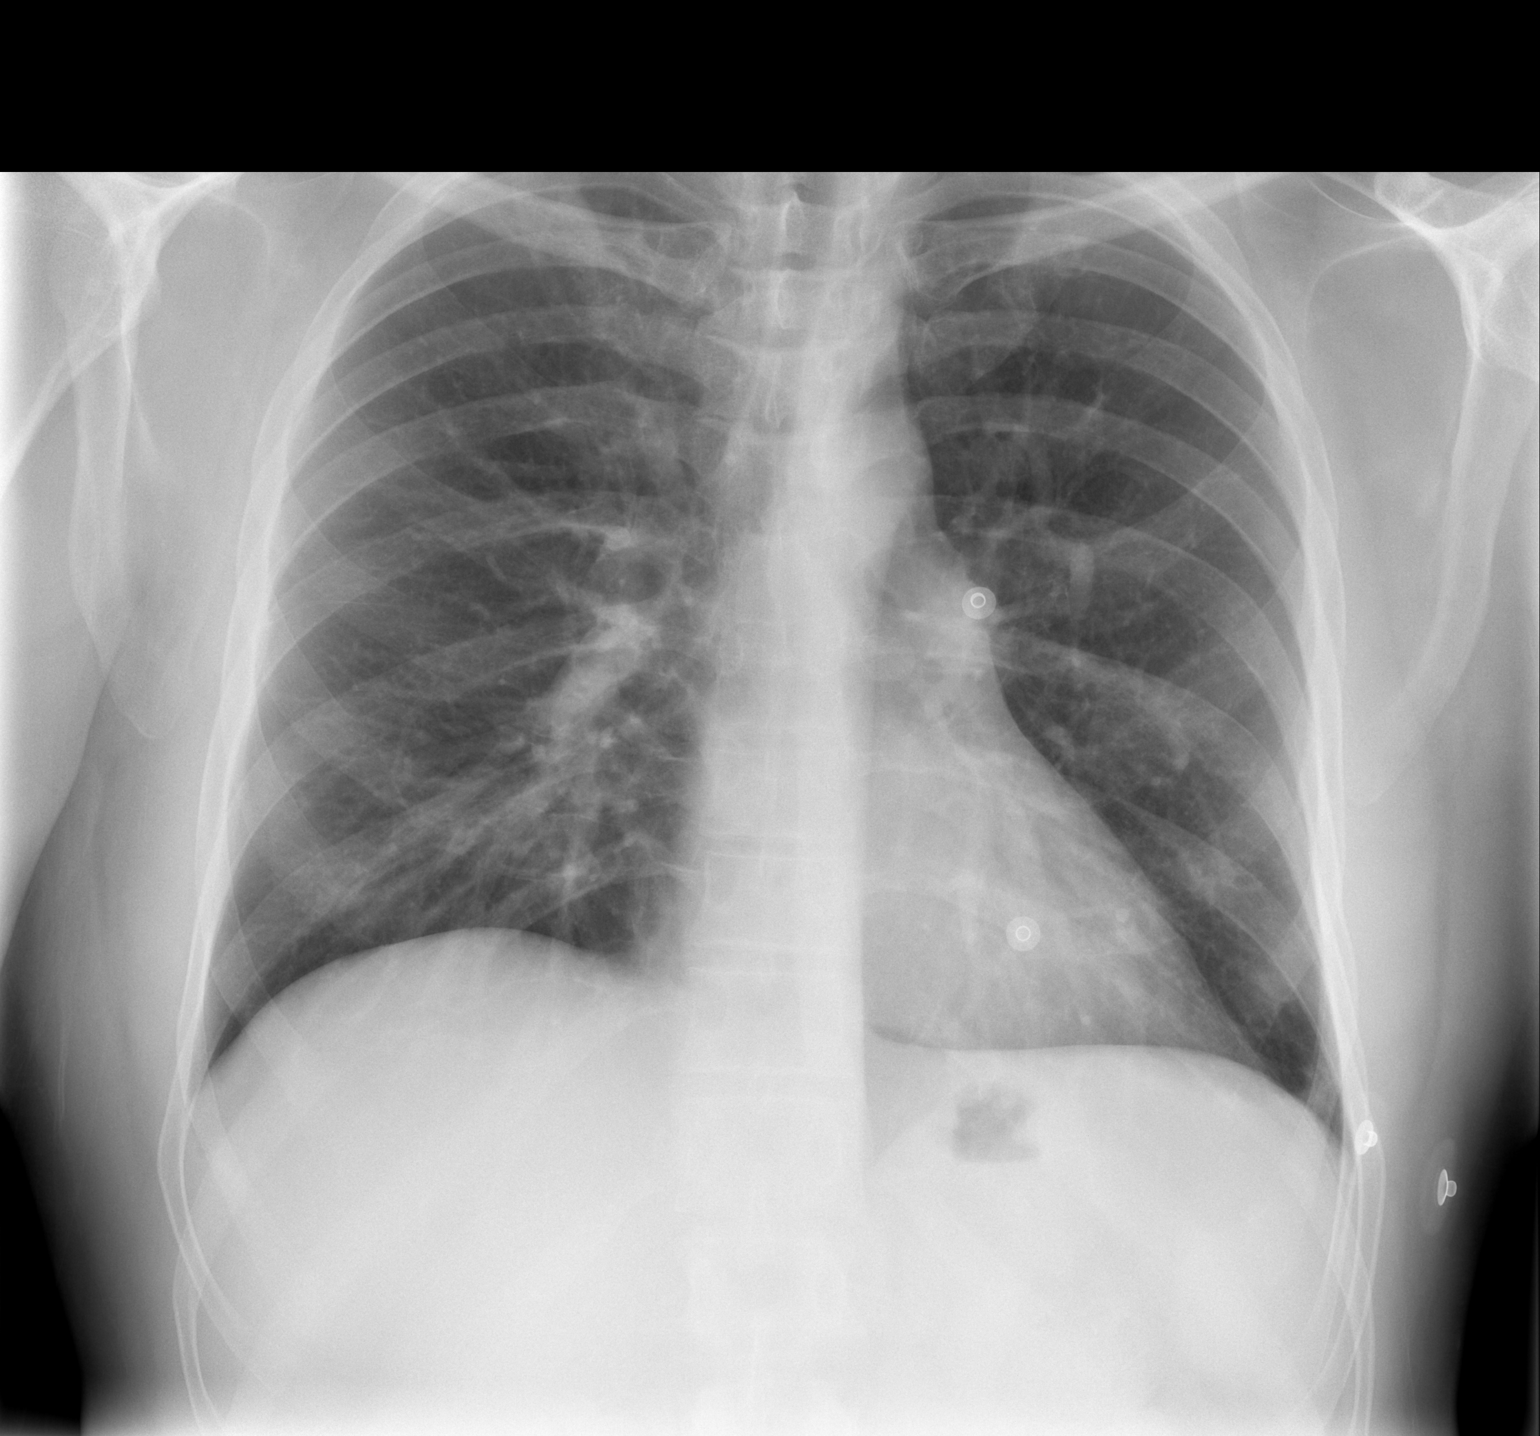

[w chest lat]
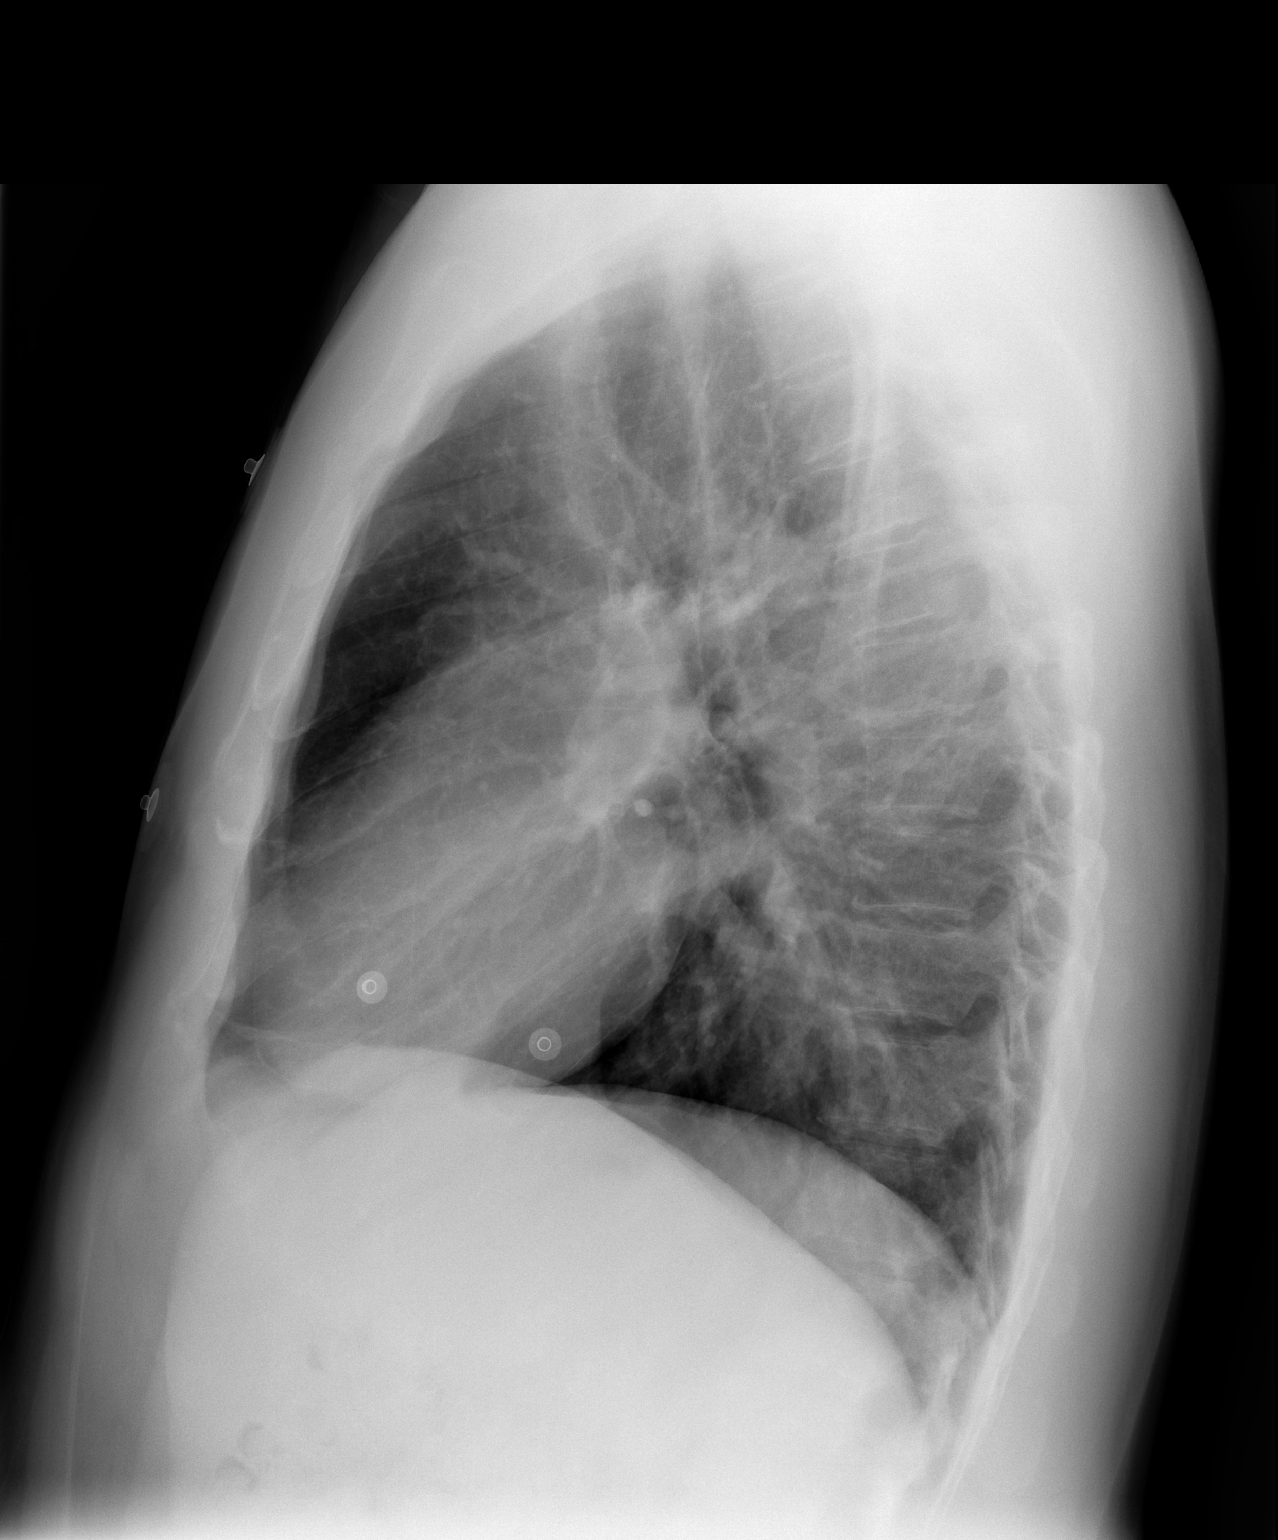

[2 of 2 positions shown; findings below may reference images not displayed]

FINDINGS: Trachea is midline.  Heart size normal.  Lungs are clear.
No pleural fluid.
IMPRESSION: No acute findings.

## 2013-03-12 ENCOUNTER — Ambulatory Visit (INDEPENDENT_AMBULATORY_CARE_PROVIDER_SITE_OTHER): Payer: 59 | Admitting: Interventional Cardiology

## 2013-03-12 ENCOUNTER — Encounter: Payer: Self-pay | Admitting: Interventional Cardiology

## 2013-03-12 VITALS — BP 112/78 | HR 45 | Ht 69.0 in | Wt 163.0 lb

## 2013-03-12 DIAGNOSIS — R079 Chest pain, unspecified: Secondary | ICD-10-CM

## 2013-03-12 DIAGNOSIS — E785 Hyperlipidemia, unspecified: Secondary | ICD-10-CM

## 2013-03-12 DIAGNOSIS — I208 Other forms of angina pectoris: Secondary | ICD-10-CM | POA: Insufficient documentation

## 2013-03-12 DIAGNOSIS — I10 Essential (primary) hypertension: Secondary | ICD-10-CM

## 2013-03-12 NOTE — Progress Notes (Signed)
Patient ID: [name], male   DOB: 23-Jan-1971, 42 y.o.   MRN: [id number]  [address], [address] [place], [place]  [address] Phone: [phone] Fax:  [phone]  Date:  03/12/2013   ID:  [name], DOB 1970-11-14, MRN [id number]  PCP:  Neldon Labella, MD     History of Present Illness: [name] is a 42 y.o. male and has a history of recurring chest pain that has been poorly characterized as to etiology. There is a possibility that he has microvascular angina. He has been asymptomatic since the last office visit. 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 He denies recent syncope, palpitations, chest pain, nitroglycerin use, or other significant cardiovascular complaints. Overall, he feels that he is doing quite well   Wt Readings from Last 3 Encounters:  03/12/13 163 lb (73.936 kg)  08/14/11 200 lb (90.719 kg)  08/14/11 200 lb (90.719 kg)     Past Medical History  Diagnosis Date  . Hypertension   . GERD (gastroesophageal reflux disease)   . Asthma   . Shortness of breath     with excertion  . Depression   . H/O hiatal hernia   . History of angina pectoris   . Myocardial bridge MID LAD  . Coronary artery disease CARDIOLOGIST - DR Verdis Prime--- LAST VISIT OCT 2012    DENIES S & S  . Kidney calculi   . Left ureteral calculus   . Dysuria     Current Outpatient Prescriptions  Medication Sig Dispense Refill  . aspirin 325 MG EC tablet Take 325 mg by mouth every morning.       . colchicine 0.6 MG tablet Take 0.6 mg by mouth daily.      Marland Kitchen glucosamine-chondroitin 500-400 MG tablet Take 1 tablet by mouth 2 (two) times daily.      Marland Kitchen ibuprofen (ADVIL,MOTRIN) 800 MG tablet Take 800 mg by mouth every 6 (six) hours as needed. For pain      . metoprolol succinate (TOPROL-XL) 50 MG 24 hr tablet Take 50 mg by mouth every morning. Take with or immediately following a meal.      . Multiple Vitamin (MULTIVITAMIN) tablet Take 1 tablet by mouth daily.      .  nitroGLYCERIN (NITROLINGUAL) 0.4 MG/SPRAY spray Place 1 spray under the tongue every 5 (five) minutes as needed. Chest pain       . simvastatin (ZOCOR) 10 MG tablet Take 10 mg by mouth daily.        No current facility-administered medications for this visit.    Allergies:    Allergies  Allergen Reactions  . Ansaid [Ocufen] Nausea And Vomiting  . Chantix [Varenicline Tartrate] Other (See Comments)    Gi intolerances   . Codeine Other (See Comments)    Gi intolerance   . Erythromycin Other (See Comments)    Gi intolerances     Social History:  The patient  reports that he quit smoking about 7 years ago. His smoking use included Cigarettes and Cigars. He has a 30 pack-year smoking history. He has never used smokeless tobacco. He reports that he does not drink alcohol or use illicit drugs.   ROS:  Please see the history of present illness.      All other systems reviewed and negative.   PHYSICAL EXAM: VS:  BP 112/78  Pulse 45  Ht 5\' 9"  (1.753 m)  Wt 163 lb (73.936 kg)  BMI 24.06 kg/m2 Well nourished, well developed,  in no acute distress HEENT: normal Neck: no JVDAt 45. Cardiac:  normal S1, S2; RRR; no murmur Lungs:  clear to auscultation bilaterally, no wheezing, rhonchi or rales Abd: soft, nontender, no hepatomegaly No fluid wave is noted. Ext: no edema Skin: warm and dry Neuro:  CNs 2-12 intact, no focal abnormalities noted  EKG:  Sinus bradycardia at 45 beats per minute. Otherwise, normal     ASSESSMENT AND PLAN:  1. No recurrences of chest pain over the last interval since being seen.  2. Blood pressure is under excellent control.  3. Lipids are being treated.  Overall [name] is doing well. No specific tests are needed. We'll see him on an as-needed basis for one year, which ever occurs first.  Signed, Sherilyn Cooter [facility] Leia Alf, MD 03/12/2013 4:08 PM

## 2013-03-12 NOTE — Patient Instructions (Addendum)
One-year followup. Call if problems develop.

## 2013-03-19 ENCOUNTER — Ambulatory Visit: Payer: 59 | Admitting: Interventional Cardiology

## 2013-04-17 ENCOUNTER — Other Ambulatory Visit: Payer: Self-pay

## 2013-06-04 ENCOUNTER — Emergency Department (HOSPITAL_COMMUNITY)
Admission: EM | Admit: 2013-06-04 | Discharge: 2013-06-04 | Disposition: A | Discharge status: Home / Self Care | Payer: 59 | Attending: Emergency Medicine | Admitting: Emergency Medicine

## 2013-06-04 ENCOUNTER — Encounter (HOSPITAL_COMMUNITY): Payer: Self-pay | Admitting: Emergency Medicine

## 2013-06-04 ENCOUNTER — Emergency Department (HOSPITAL_COMMUNITY): Payer: 59

## 2013-06-04 DIAGNOSIS — I209 Angina pectoris, unspecified: Secondary | ICD-10-CM | POA: Insufficient documentation

## 2013-06-04 DIAGNOSIS — S62319A Displaced fracture of base of unspecified metacarpal bone, initial encounter for closed fracture: Secondary | ICD-10-CM | POA: Insufficient documentation

## 2013-06-04 DIAGNOSIS — F3289 Other specified depressive episodes: Secondary | ICD-10-CM | POA: Insufficient documentation

## 2013-06-04 DIAGNOSIS — I10 Essential (primary) hypertension: Secondary | ICD-10-CM | POA: Insufficient documentation

## 2013-06-04 DIAGNOSIS — Z87442 Personal history of urinary calculi: Secondary | ICD-10-CM | POA: Insufficient documentation

## 2013-06-04 DIAGNOSIS — J45909 Unspecified asthma, uncomplicated: Secondary | ICD-10-CM | POA: Insufficient documentation

## 2013-06-04 DIAGNOSIS — S62309A Unspecified fracture of unspecified metacarpal bone, initial encounter for closed fracture: Secondary | ICD-10-CM

## 2013-06-04 DIAGNOSIS — Z79899 Other long term (current) drug therapy: Secondary | ICD-10-CM | POA: Insufficient documentation

## 2013-06-04 DIAGNOSIS — W2209XA Striking against other stationary object, initial encounter: Secondary | ICD-10-CM | POA: Insufficient documentation

## 2013-06-04 DIAGNOSIS — Z87891 Personal history of nicotine dependence: Secondary | ICD-10-CM | POA: Insufficient documentation

## 2013-06-04 DIAGNOSIS — I2510 Atherosclerotic heart disease of native coronary artery without angina pectoris: Secondary | ICD-10-CM | POA: Insufficient documentation

## 2013-06-04 DIAGNOSIS — Z8719 Personal history of other diseases of the digestive system: Secondary | ICD-10-CM | POA: Insufficient documentation

## 2013-06-04 DIAGNOSIS — Y9355 Activity, bike riding: Secondary | ICD-10-CM | POA: Insufficient documentation

## 2013-06-04 DIAGNOSIS — Y92410 Unspecified street and highway as the place of occurrence of the external cause: Secondary | ICD-10-CM | POA: Insufficient documentation

## 2013-06-04 DIAGNOSIS — Z7982 Long term (current) use of aspirin: Secondary | ICD-10-CM | POA: Insufficient documentation

## 2013-06-04 DIAGNOSIS — F329 Major depressive disorder, single episode, unspecified: Secondary | ICD-10-CM | POA: Insufficient documentation

## 2013-06-04 MED ORDER — IBUPROFEN 800 MG PO TABS
800.0000 mg | ORAL_TABLET | Freq: Once | ORAL | Status: AC
  Administered 2013-06-04: 800 mg via ORAL
  Filled 2013-06-04: qty 1

## 2013-06-04 MED ORDER — OXYCODONE-ACETAMINOPHEN 5-325 MG PO TABS: 1.0000 | ORAL_TABLET | ORAL | Status: DC | PRN

## 2013-06-04 MED ORDER — IBUPROFEN 800 MG PO TABS: 800.0000 mg | ORAL_TABLET | Freq: Three times a day (TID) | ORAL | Status: DC
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
## 2013-06-06 NOTE — ED Provider Notes (Signed)
Medical screening examination/treatment/procedure(s) were performed by non-physician practitioner and as supervising physician I was immediately available for consultation/collaboration.   Loren Racer, MD 06/06/13 [phone]

## 2013-06-09 ENCOUNTER — Other Ambulatory Visit: Payer: Self-pay | Admitting: Interventional Cardiology

## 2013-07-24 ENCOUNTER — Ambulatory Visit: Payer: 59 | Attending: Orthopedic Surgery | Admitting: Occupational Therapy

## 2013-07-24 DIAGNOSIS — M25649 Stiffness of unspecified hand, not elsewhere classified: Secondary | ICD-10-CM | POA: Insufficient documentation

## 2013-07-24 DIAGNOSIS — M6281 Muscle weakness (generalized): Secondary | ICD-10-CM | POA: Insufficient documentation

## 2013-07-24 DIAGNOSIS — M25549 Pain in joints of unspecified hand: Secondary | ICD-10-CM | POA: Insufficient documentation

## 2013-07-24 DIAGNOSIS — IMO0001 Reserved for inherently not codable concepts without codable children: Secondary | ICD-10-CM | POA: Insufficient documentation

## 2013-07-30 ENCOUNTER — Ambulatory Visit: Payer: 59 | Admitting: Occupational Therapy

## 2013-08-01 ENCOUNTER — Ambulatory Visit (INDEPENDENT_AMBULATORY_CARE_PROVIDER_SITE_OTHER): Payer: 59 | Admitting: *Deleted

## 2013-08-01 DIAGNOSIS — E785 Hyperlipidemia, unspecified: Secondary | ICD-10-CM

## 2013-08-01 LAB — LIPID PANEL
Cholesterol: 148 mg/dL (ref 0–200)
HDL: 51.5 mg/dL (ref >39.00)
LDL CALC: 83 mg/dL (ref 0–99)
Total CHOL/HDL Ratio: 3
Triglycerides: 69 mg/dL (ref 0.0–149.0)
VLDL: 13.8 mg/dL (ref 0.0–40.0)

## 2013-08-01 LAB — HEPATIC FUNCTION PANEL
ALT: 17 U/L (ref 0–53)
AST: 17 U/L (ref 0–37)
Albumin: 4.5 g/dL (ref 3.5–5.2)
Alkaline Phosphatase: 48 U/L (ref 39–117)
BILIRUBIN TOTAL: 0.8 mg/dL (ref 0.3–1.2)
Bilirubin, Direct: 0 mg/dL (ref 0.0–0.3)
Total Protein: 7 g/dL (ref 6.0–8.3)

## 2013-08-04 ENCOUNTER — Ambulatory Visit: Payer: 59 | Admitting: Occupational Therapy

## 2013-08-08 ENCOUNTER — Telehealth: Payer: Self-pay

## 2013-08-08 NOTE — Telephone Encounter (Signed)
Message copied by Lamar Laundry on Fri Aug 08, 2013 10:35 AM ------      Message from: Daneen Schick      Created: Thu Aug 07, 2013  3:35 PM       Normal liver tests ------

## 2013-08-08 NOTE — Telephone Encounter (Signed)
lmom.  lipid andl liver tests  Normal

## 2013-08-12 ENCOUNTER — Ambulatory Visit: Payer: 59 | Attending: Orthopedic Surgery | Admitting: Occupational Therapy

## 2013-08-12 DIAGNOSIS — M25649 Stiffness of unspecified hand, not elsewhere classified: Secondary | ICD-10-CM | POA: Insufficient documentation

## 2013-08-12 DIAGNOSIS — M25549 Pain in joints of unspecified hand: Secondary | ICD-10-CM | POA: Insufficient documentation

## 2013-08-12 DIAGNOSIS — M6281 Muscle weakness (generalized): Secondary | ICD-10-CM | POA: Insufficient documentation

## 2013-08-22 ENCOUNTER — Encounter: Payer: 59 | Admitting: Occupational Therapy

## 2013-09-22 ENCOUNTER — Encounter (HOSPITAL_COMMUNITY): Payer: Self-pay | Admitting: Emergency Medicine

## 2013-09-22 ENCOUNTER — Emergency Department (HOSPITAL_COMMUNITY)
Admission: EM | Admit: 2013-09-22 | Discharge: 2013-09-22 | Disposition: A | Discharge status: Home / Self Care | Payer: 59 | Source: Home / Self Care | Attending: Family Medicine | Admitting: Family Medicine

## 2013-09-22 ENCOUNTER — Emergency Department (INDEPENDENT_AMBULATORY_CARE_PROVIDER_SITE_OTHER): Payer: 59

## 2013-09-22 DIAGNOSIS — Y9289 Other specified places as the place of occurrence of the external cause: Secondary | ICD-10-CM

## 2013-09-22 DIAGNOSIS — W010XXA Fall on same level from slipping, tripping and stumbling without subsequent striking against object, initial encounter: Secondary | ICD-10-CM

## 2013-09-22 DIAGNOSIS — S20229A Contusion of unspecified back wall of thorax, initial encounter: Secondary | ICD-10-CM

## 2013-09-22 DIAGNOSIS — S300XXA Contusion of lower back and pelvis, initial encounter: Secondary | ICD-10-CM

## 2013-09-22 LAB — POCT URINALYSIS DIP (DEVICE)
BILIRUBIN URINE: NEGATIVE
Glucose, UA: NEGATIVE mg/dL
HGB URINE DIPSTICK: NEGATIVE
Ketones, ur: NEGATIVE mg/dL
LEUKOCYTES UA: NEGATIVE
NITRITE: NEGATIVE
PROTEIN: NEGATIVE mg/dL
Specific Gravity, Urine: 1.01 (ref 1.005–1.030)
Urobilinogen, UA: 0.2 mg/dL (ref 0.0–1.0)
pH: 7 (ref 5.0–8.0)

## 2013-09-22 NOTE — Discharge Instructions (Signed)
Tailbone Injury °The tailbone (coccyx) is the small bone at the lower end of the spine. A tailbone injury may involve stretched ligaments, bruising, or a broken bone (fracture). Women are more vulnerable to this injury due to having a wider pelvis. °CAUSES  °This type of injury typically occurs from falling and landing on the tailbone. Repeated strain or friction from actions such as rowing and bicycling may also injure the area. The tailbone can be injured during childbirth. Infections or tumors may also press on the tailbone and cause pain. Sometimes, the cause of injury is unknown. °SYMPTOMS  °· Bruising. °· Pain when sitting. °· Painful bowel movements. °· In women, pain during intercourse. °DIAGNOSIS  °Your caregiver can diagnose a tailbone injury based on your symptoms and a physical exam. X-rays may be taken if a fracture is suspected. Your caregiver may also use an MRI scan imaging test to evaluate your symptoms. °TREATMENT  °Your caregiver may prescribe medicines to help relieve your pain. Most tailbone injuries heal on their own in 4 to 6 weeks. However, if the injury is caused by an infection or tumor, the recovery period may vary. °PREVENTION  °Wear appropriate padding and sports gear when bicycling and rowing. This can help prevent an injury from repeated strain or friction. °HOME CARE INSTRUCTIONS  °· Put ice on the injured area. °· Put ice in a plastic bag. °· Place a towel between your skin and the bag. °· Leave the ice on for 15-20 minutes, every hour while awake for the first 1 to 2 days. °· Sit on a large, rubber or inflated ring or cushion to ease your pain. Lean forward when sitting to help decrease discomfort. °· Avoid sitting for long periods of time. °· Increase your activity as the pain allows. °· Only take over-the-counter or prescription medicines for pain, discomfort, or fever as directed by your caregiver. °· You may use stool softeners if it is painful to have a bowel movement, or as  directed by your caregiver. °· Eat a diet with plenty of fiber to help prevent constipation. °· Keep all follow-up appointments as directed by your caregiver. °SEEK MEDICAL CARE IF:  °· Your pain becomes worse. °· Your bowel movements cause a great deal of discomfort. °· You are unable to have a bowel movement. °· You have a fever. °MAKE SURE YOU: °· Understand these instructions. °· Will watch your condition. °· Will get help right away if you are not doing well or get worse. °Document Released: 05/26/2000 Document Revised: 08/21/2011 Document Reviewed: 12/22/2010 °[employer]® Patient Information ©2014 [employer]. ° °

## 2013-09-22 NOTE — ED Notes (Signed)
Slipped and fell yesterday , landed on tailbone

## 2013-09-22 NOTE — ED Provider Notes (Signed)
CSN: [id number]     Arrival date & time 09/22/13  1754 History   First MD Initiated Contact with Patient 09/22/13 1851     Chief Complaint  Patient presents with  . Fall   (Consider location/radiation/quality/duration/timing/severity/associated sxs/prior Treatment) HPI Comments: Patient reports he was performing a survey of a creek yesterday when he slipped on a wet rock, fell backwards and landed on buttocks and lower back. Has had persistent discomfort since the fall. Presents for evaluation. No head trauma. No bowel or bladder incontinence. No genital anesthesia. No loss of strength or sensation of lower extremities. Reports some radiation of pain to right buttock  The history is provided by the patient.    Past Medical History  Diagnosis Date  . Hypertension   . GERD (gastroesophageal reflux disease)   . Asthma   . Shortness of breath     with excertion  . Depression   . H/O hiatal hernia   . History of angina pectoris   . Myocardial bridge MID LAD  . Coronary artery disease CARDIOLOGIST - DR Daneen Schick--- LAST VISIT OCT 2012    DENIES S & S  . Kidney calculi   . Left ureteral calculus   . Dysuria    Past Surgical History  Procedure Laterality Date  . Cardiac catheterization  01-07-2010-- DR Daneen Schick    NORMAL LVF/ PATENT CORONARY ARTERIES/ MYOCARDIAL BRIDGE MID LAD  . Vericocelectomy  1992  . Extracorporeal shock wave lithotripsy  08-07-11    LEFT  . Cystoscopy w/ ureteral stent placement  08/15/2011    Procedure: CYSTOSCOPY WITH STENT REPLACEMENT;  Surgeon: Fredricka Bonine, MD;  Location: [facility];  Service: Urology;  Laterality: Left;  stone basket extraction left, left ureteroscopy   History reviewed. No pertinent family history. History  Substance Use Topics  . Smoking status: Former Smoker -- 3.00 packs/day for 10 years    Types: Cigarettes, Cigars    Quit date: 08/13/2005  . Smokeless tobacco: Never Used  . Alcohol Use: No     Review of Systems  All other systems reviewed and are negative.   Allergies  Ansaid; Chantix; Codeine; and Erythromycin  Home Medications   Current Outpatient Rx  Name  Route  Sig  Dispense  Refill  . aspirin 325 MG EC tablet   Oral   Take 325 mg by mouth every morning.          . citalopram (CELEXA) 20 MG tablet   Oral   Take 20 mg by mouth daily.         . colchicine 0.6 MG tablet   Oral   Take 0.6 mg by mouth daily.         Marland Kitchen glucosamine-chondroitin 500-400 MG tablet   Oral   Take 1 tablet by mouth 2 (two) times daily.         Marland Kitchen ibuprofen (ADVIL,MOTRIN) 800 MG tablet   Oral   Take 800 mg by mouth every 6 (six) hours as needed. For pain         . ibuprofen (ADVIL,MOTRIN) 800 MG tablet   Oral   Take 1 tablet (800 mg total) by mouth 3 (three) times daily.   21 tablet   0   . metoprolol succinate (TOPROL-XL) 50 MG 24 hr tablet   Oral   Take 50 mg by mouth every morning. Take with or immediately following a meal.         . metoprolol succinate (TOPROL-XL) 50  MG 24 hr tablet      TAKE 1 TABLET BY MOUTH DAILY   90 tablet   3   . Multiple Vitamin (MULTIVITAMIN) tablet   Oral   Take 1 tablet by mouth daily.         . naproxen sodium (ANAPROX) 220 MG tablet   Oral   Take 440 mg by mouth 2 (two) times daily as needed (for pain).         . nitroGLYCERIN (NITROLINGUAL) 0.4 MG/SPRAY spray   Sublingual   Place 1 spray under the tongue every 5 (five) minutes as needed. Chest pain          . oxyCODONE-acetaminophen (PERCOCET/ROXICET) 5-325 MG per tablet   Oral   Take 1-2 tablets by mouth every 4 (four) hours as needed for severe pain.   20 tablet   0   . simvastatin (ZOCOR) 10 MG tablet   Oral   Take 10 mg by mouth daily.           BP 134/72  Pulse 54  Temp(Src) 98 F (36.7 C) (Oral)  Resp 14 Physical Exam  Nursing note and vitals reviewed. Constitutional: He is oriented to person, place, and time. He appears well-developed and  well-nourished. No distress.  HENT:  Head: Normocephalic and atraumatic.  Eyes: Conjunctivae are normal.  Neck: Normal range of motion.  Cardiovascular: Normal rate.   Pulmonary/Chest: Effort normal.  Musculoskeletal: Normal range of motion.       Back:  Outlined area is area of discomfort  Neurological: He is alert and oriented to person, place, and time. He has normal strength. No sensory deficit. Gait normal.  Skin: Skin is warm and dry.  +intact  Psychiatric: He has a normal mood and affect. His behavior is normal.    ED Course  Procedures (including critical care time) Labs Review Labs Reviewed  POCT URINALYSIS DIP (DEVICE)   Imaging Review Dg Lumbar Spine Complete  09/22/2013   CLINICAL DATA:  Fall.  EXAM: LUMBAR SPINE - COMPLETE 4+ VIEW  COMPARISON:  None.  FINDINGS: There is no evidence of lumbar spine fracture. Alignment is normal. Intervertebral disc spaces are maintained.  IMPRESSION: Negative.   Electronically Signed   By: Aletta Edouard M.D.   On: 09/22/2013 20:27   Dg Sacrum/coccyx  09/22/2013   CLINICAL DATA:  Sacrum and coccyx pain secondary to a fall.  EXAM: SACRUM AND COCCYX - 2+ VIEW  COMPARISON:  CT scan abdomen and pelvis dated 08/16/2011  FINDINGS: There is no evidence of fracture or other focal bone lesions  IMPRESSION: Normal exam.   Electronically Signed   By: Rozetta Nunnery M.D.   On: 09/22/2013 20:16     MDM   1. Contusion of coccyx   2. Contusion of lower back   Films negative for fracture. Exam without focal neurological deficit. Offered patient assistance with pain management and he stated he would prefer to use ibuprofen at home. Advised to follow up with PCP if symptoms worsen.     [place], [place] 09/22/13 2224

## 2013-09-23 NOTE — ED Provider Notes (Signed)
Medical screening examination/treatment/procedure(s) were performed by resident physician or non-physician practitioner and as supervising physician I was immediately available for consultation/collaboration.   Pauline Good MD.   Billy Fischer, MD 09/23/13 [phone]

## 2013-12-08 ENCOUNTER — Other Ambulatory Visit: Payer: Self-pay

## 2013-12-08 MED ORDER — SIMVASTATIN 10 MG PO TABS: 10.0000 mg | ORAL_TABLET | Freq: Every day | ORAL | Status: DC

## 2013-12-08 MED ORDER — COLCHICINE 0.6 MG PO TABS: 0.6000 mg | ORAL_TABLET | Freq: Every day | ORAL | Status: DC

## 2014-01-27 ENCOUNTER — Ambulatory Visit: Payer: 59 | Admitting: Physical Therapy

## 2014-02-04 ENCOUNTER — Ambulatory Visit: Payer: 59 | Attending: Physician Assistant | Admitting: Physical Therapy

## 2014-02-04 DIAGNOSIS — IMO0001 Reserved for inherently not codable concepts without codable children: Secondary | ICD-10-CM | POA: Insufficient documentation

## 2014-02-04 DIAGNOSIS — I10 Essential (primary) hypertension: Secondary | ICD-10-CM | POA: Insufficient documentation

## 2014-02-04 DIAGNOSIS — M25569 Pain in unspecified knee: Secondary | ICD-10-CM | POA: Diagnosis not present

## 2014-02-18 ENCOUNTER — Ambulatory Visit: Payer: 59 | Attending: Physician Assistant | Admitting: Physical Therapy

## 2014-02-18 DIAGNOSIS — IMO0001 Reserved for inherently not codable concepts without codable children: Secondary | ICD-10-CM | POA: Insufficient documentation

## 2014-02-18 DIAGNOSIS — I10 Essential (primary) hypertension: Secondary | ICD-10-CM | POA: Diagnosis not present

## 2014-02-18 DIAGNOSIS — M25569 Pain in unspecified knee: Secondary | ICD-10-CM | POA: Insufficient documentation

## 2014-02-23 ENCOUNTER — Ambulatory Visit: Payer: 59 | Admitting: Physical Therapy

## 2014-02-23 DIAGNOSIS — IMO0001 Reserved for inherently not codable concepts without codable children: Secondary | ICD-10-CM | POA: Diagnosis not present

## 2014-02-25 ENCOUNTER — Ambulatory Visit: Payer: 59 | Admitting: Physical Therapy

## 2014-02-25 DIAGNOSIS — IMO0001 Reserved for inherently not codable concepts without codable children: Secondary | ICD-10-CM | POA: Diagnosis not present

## 2014-03-04 ENCOUNTER — Encounter: Payer: 59 | Admitting: Physical Therapy

## 2014-03-06 ENCOUNTER — Encounter: Payer: 59 | Admitting: Physical Therapy

## 2014-04-06 ENCOUNTER — Encounter: Payer: Self-pay | Admitting: Interventional Cardiology

## 2014-04-07 ENCOUNTER — Telehealth: Payer: Self-pay | Admitting: Interventional Cardiology

## 2014-04-07 NOTE — Telephone Encounter (Signed)
New message      Talk to the nurse about his medication

## 2014-04-07 NOTE — Telephone Encounter (Signed)
Returned pt call. Pt sts that he is having increased fatigue especially in the afternoon, pt sts that he is falling asleep in meetings.pt bp has been running on the low end with his systolic in the 902X/11B heartrate in the 40s.pt wants to be weaned of Metoprolol. Pt has an upcoming appt with Dr.Smith on 11/6.pt wants to begin weaning off asap so that he can be evaluated at his appt.adv pt that Dr.Smti is out of the office until 10/29.I will fwd him a message and call back with his instructions. Pt agreeable and verbalized understanding.

## 2014-04-10 MED ORDER — METOPROLOL SUCCINATE ER 25 MG PO TB24: 25.0000 mg | ORAL_TABLET | Freq: Every morning | ORAL | Status: DC

## 2014-04-10 NOTE — Telephone Encounter (Signed)
called to give pt Dr.Smith instructions.lmtcb

## 2014-04-10 NOTE — Telephone Encounter (Signed)
Ok to stop Metoprolol by decreasing to 25 mg daily for one week then stop.

## 2014-04-10 NOTE — Telephone Encounter (Signed)
Pt aware of Dr.Smith instructions.Ok to stop Metoprolol by decreasing to 25 mg daily for one week then stop.pt verbalized understanding.

## 2014-04-17 ENCOUNTER — Ambulatory Visit (INDEPENDENT_AMBULATORY_CARE_PROVIDER_SITE_OTHER): Payer: 59 | Admitting: Interventional Cardiology

## 2014-04-17 ENCOUNTER — Encounter: Payer: Self-pay | Admitting: Interventional Cardiology

## 2014-04-17 VITALS — BP 120/78 | HR 58 | Ht 69.0 in | Wt 185.8 lb

## 2014-04-17 DIAGNOSIS — E785 Hyperlipidemia, unspecified: Secondary | ICD-10-CM

## 2014-04-17 DIAGNOSIS — I10 Essential (primary) hypertension: Secondary | ICD-10-CM

## 2014-04-17 NOTE — Progress Notes (Signed)
Patient ID: Braedon Sjogren, male   DOB: Nov 06, 1970, 43 y.o.   MRN: [id number]    [address], [address] [place], [place]  [address] Phone: [phone] Fax:  [phone]  Date:  04/17/2014   ID:  Jonelle Sidle, DOB 03/23/1971, MRN [id number]  PCP:  Tawanna Solo, MD   ASSESSMENT:  1. History of hypertension, early well-controlled. Essential 2. Recurring chest discomfort felt to be related to pericardial inflammation, asymptomatic 3. History of myocardial bridge without coronary disease  PLAN:  1. Discontinue coaxing, discontinue simvastatin, beta blocker therapy as already been discontinued 2. Lipid panel in 4 months 3. Continue active lifestyle and aerobic physical activity   SUBJECTIVE: Zaden Sako is a 43 y.o. male who is asymptomatic. His had difficulty with recurring chest pain. Coronary angiography number demonstrated CAD. On beta blocker therapy he was feeling fatigued. We cleaned this and he is done well.   Wt Readings from Last 3 Encounters:  04/17/14 185 lb 12.8 oz (84.278 kg)  03/12/13 163 lb (73.936 kg)  08/14/11 200 lb (90.719 kg)     Past Medical History  Diagnosis Date  . Hypertension   . GERD (gastroesophageal reflux disease)   . Asthma   . Shortness of breath     with excertion  . Depression   . H/O hiatal hernia   . History of angina pectoris   . Myocardial bridge MID LAD  . Coronary artery disease CARDIOLOGIST - DR Daneen Schick--- LAST VISIT OCT 2012    DENIES S & S  . Kidney calculi   . Left ureteral calculus   . Dysuria     Current Outpatient Prescriptions  Medication Sig Dispense Refill  . aspirin 325 MG EC tablet Take 325 mg by mouth every morning.     . citalopram (CELEXA) 20 MG tablet Take 20 mg by mouth daily.    . colchicine 0.6 MG tablet Take 1 tablet (0.6 mg total) by mouth daily. 90 tablet 2  . glucosamine-chondroitin 500-400 MG tablet Take 1 tablet by mouth 2 (two) times daily.    Marland Kitchen ibuprofen (ADVIL,MOTRIN) 800 MG  tablet Take 800 mg by mouth every 6 (six) hours as needed. For pain    . ibuprofen (ADVIL,MOTRIN) 800 MG tablet Take 1 tablet (800 mg total) by mouth 3 (three) times daily. 21 tablet 0  . Multiple Vitamin (MULTIVITAMIN) tablet Take 1 tablet by mouth daily.    . naproxen sodium (ANAPROX) 220 MG tablet Take 440 mg by mouth 2 (two) times daily as needed (for pain).    . nitroGLYCERIN (NITROLINGUAL) 0.4 MG/SPRAY spray Place 1 spray under the tongue every 5 (five) minutes as needed. Chest pain     . simvastatin (ZOCOR) 10 MG tablet Take 1 tablet (10 mg total) by mouth daily. 90 tablet 3   No current facility-administered medications for this visit.    Allergies:    Allergies  Allergen Reactions  . Ansaid [Ocufen] Nausea And Vomiting  . Chantix [Varenicline Tartrate] Other (See Comments)    Gi intolerances   . Codeine Other (See Comments)    Gi intolerance   . Erythromycin Other (See Comments)    Gi intolerances     Social History:  The patient  reports that he quit smoking about 8 years ago. His smoking use included Cigarettes and Cigars. He has a 30 pack-year smoking history. He has never used smokeless tobacco. He reports that he does not drink alcohol or use illicit drugs.   ROS:  Please see the history of present illness.   Denies edema. No orthopnea, PND, or sleep disturbance. He does snore   All other systems reviewed and negative.   OBJECTIVE: VS:  BP 120/78 mmHg  Pulse 58  Ht 5' 9"  (1.753 m)  Wt 185 lb 12.8 oz (84.278 kg)  BMI 27.43 kg/m2 Well nourished, well developed, in no acute distress, compatible with age and appearance HEENT: normal Neck: JVD flat. Carotid bruit absent  Cardiac:  normal S1, S2; RRR; no murmur Lungs:  clear to auscultation bilaterally, no wheezing, rhonchi or rales Abd: soft, nontender, no hepatomegaly Ext: Edema absent. Pulses 2+ Skin: warm and dry Neuro:  CNs 2-12 intact, no focal abnormalities noted  EKG:  normal       Signed, Illene Labrador III, MD 04/17/2014 11:08 AM

## 2014-04-17 NOTE — Patient Instructions (Signed)
Your physician has recommended you make the following change in your medication:  1) STOP Simvastatin 2) STOP Colchicine  Your physician recommends that you return for a FASTING lipid profile and alt: February 2016  Your physician recommends that you schedule a follow-up appointment in: 1 year or as needed

## 2014-04-29 ENCOUNTER — Ambulatory Visit (INDEPENDENT_AMBULATORY_CARE_PROVIDER_SITE_OTHER): Payer: 59 | Admitting: Pulmonary Disease

## 2014-04-29 ENCOUNTER — Encounter: Payer: Self-pay | Admitting: Pulmonary Disease

## 2014-04-29 ENCOUNTER — Ambulatory Visit (INDEPENDENT_AMBULATORY_CARE_PROVIDER_SITE_OTHER)
Admission: RE | Admit: 2014-04-29 | Discharge: 2014-04-29 | Disposition: A | Discharge status: Home / Self Care | Payer: 59 | Source: Ambulatory Visit | Attending: Pulmonary Disease | Admitting: Pulmonary Disease

## 2014-04-29 VITALS — BP 115/78 | HR 64 | Ht 69.0 in | Wt 186.4 lb

## 2014-04-29 DIAGNOSIS — Z9989 Dependence on other enabling machines and devices: Secondary | ICD-10-CM

## 2014-04-29 DIAGNOSIS — G478 Other sleep disorders: Secondary | ICD-10-CM

## 2014-04-29 DIAGNOSIS — F5101 Primary insomnia: Secondary | ICD-10-CM

## 2014-04-29 DIAGNOSIS — R059 Cough, unspecified: Secondary | ICD-10-CM

## 2014-04-29 DIAGNOSIS — R05 Cough: Secondary | ICD-10-CM

## 2014-04-29 DIAGNOSIS — G4733 Obstructive sleep apnea (adult) (pediatric): Secondary | ICD-10-CM | POA: Insufficient documentation

## 2014-04-29 MED ORDER — ESZOPICLONE 2 MG PO TABS: ORAL_TABLET | ORAL | Status: DC

## 2014-04-29 NOTE — Assessment & Plan Note (Signed)
Appears to be of acute onset, no obvious stressor or mental health issues identified. There does not appear to be an organic cause. As such I think it is reasonable to use a low-dose hypnotic, to help him tide over this issue. We'll prescribe Lunesta 2 mg-half to 1 tablet at bedtime and reassess need for this in a month's time We also discussed use of melatonin 2 hours before bedtime We also discussed simple rules of sleep hygiene

## 2014-04-29 NOTE — Assessment & Plan Note (Addendum)
Given non-refreshing sleep, snoring and weight gain, I think it is reasonable to evaluate for sleep-disordered breathing. The absence of comorbidities we can proceed with a Home sleep test Take iron supplements  This will also help with restlesslegs Trial of Lunesta 2 mg (take 1/2- 1 tab) at bedtime x 30

## 2014-04-29 NOTE — Patient Instructions (Signed)
CXR today  Home sleep test Take iron supplements  This will also help with restlesslegs Trial of Lunesta 2 mg (take 1/2- 1 tab) at bedtime x 30

## 2014-04-29 NOTE — Progress Notes (Signed)
Subjective:    Patient ID: [name], male    DOB: [birth year], 43 y.o.   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 He reports tiredness in the afternoons for about 2 years. For the last 3 months, he has difficulty falling asleep and maintaining sleep due to repeated awakenings. This has caused excessive daytime somnolence, and sometimes he finds himself falling asleep in meetings. Epworth sleepiness score is 17 and he reports sleepiness as a passenger in a car, and lying down to rest in the afternoons. Bedtime is around 9-10 PM, he is often lying down on the couch before that while watching TV, he sleeps on his right side, sleep latency is one to 3 hours, reports 5-6 nocturnal awakenings without post void sleep latency and is out of bed by 6:30 AM feeling tired, denies dryness of mouth or headaches. He is gained 20 pounds in the last year. He has tried several over-the-counter remedies such as melatonin, Benadryl and Unisom. He reports fidgety sensation in his legs-but does not feel like this is a problem during sleep. He denies creepy crawly sensations, no tingling and numbness or nocturnal cramps  He has tapered himself off Celexa and metoprolol in the last 3 weeks. He also reports a dry cough, denies exposure to a patient with tuberculosis, QuantiFERON was negative. Chest x-ray performed today did not show any infiltrates. His father had lung cancer at age 93 Labs from his PCP-showed normal CBC, liver tests, slightly low ferritin at 32 He has some concern about using a sleep aid and getting addicted to  Past Medical History  Diagnosis Date  . Hypertension   . GERD (gastroesophageal reflux disease)   . Asthma   . Shortness of breath     with excertion  .  Depression   . H/O hiatal hernia   . History of angina pectoris   . Myocardial bridge MID LAD  . Coronary artery disease CARDIOLOGIST - DR Daneen Schick--- LAST VISIT OCT 2012    DENIES S & S  . Kidney calculi   . Left ureteral calculus   . Dysuria     Past Surgical History  Procedure Laterality Date  . Cardiac catheterization  01-07-2010-- DR Daneen Schick    NORMAL LVF/ PATENT CORONARY ARTERIES/ MYOCARDIAL BRIDGE MID LAD  . Vericocelectomy  1992  . Extracorporeal shock wave lithotripsy  08-07-11    LEFT  . Cystoscopy w/ ureteral stent placement  08/15/2011    Procedure: CYSTOSCOPY WITH STENT REPLACEMENT;  Surgeon: Fredricka Bonine, MD;  Location: [facility];  Service: Urology;  Laterality: Left;  stone basket extraction left, left ureteroscopy    Allergies  Allergen Reactions  . Ansaid [Ocufen] Nausea And Vomiting  . Chantix [Varenicline Tartrate] Other (See Comments)    Gi intolerances   . Codeine Other (See Comments)    Gi intolerance   . Erythromycin Other (See Comments)    Gi intolerances      Review of Systems  Constitutional: Positive for unexpected weight change. Negative for fever.  HENT: Positive for ear pain. Negative for congestion, dental problem, nosebleeds, postnasal drip, rhinorrhea, sinus pressure, sneezing, sore throat and trouble swallowing.   Eyes: Negative for redness and itching.  Respiratory: Positive for cough. Negative for chest tightness, shortness  of breath and wheezing.   Cardiovascular: Negative for palpitations and leg swelling.  Gastrointestinal: Negative for nausea and vomiting.  Genitourinary: Negative for dysuria.  Musculoskeletal: Negative for joint swelling.  Skin: Negative for rash.  Neurological: Positive for headaches.  Hematological: Does not bruise/bleed easily.  Psychiatric/Behavioral: Negative for dysphoric mood. The patient is not nervous/anxious.        Objective:   Physical Exam  Gen. Pleasant,  well-nourished, in no distress, normal affect ENT - no lesions, no post nasal drip Neck: No JVD, no thyromegaly, no carotid bruits Lungs: no use of accessory muscles, no dullness to percussion, clear without rales or rhonchi  Cardiovascular: Rhythm regular, heart sounds  normal, no murmurs or gallops, no peripheral edema Abdomen: soft and non-tender, no hepatosplenomegaly, BS normal. Musculoskeletal: No deformities, no cyanosis or clubbing Neuro:  alert, non focal       Assessment & Plan:

## 2014-05-27 ENCOUNTER — Telehealth: Payer: Self-pay | Admitting: Pulmonary Disease

## 2014-05-27 NOTE — Telephone Encounter (Signed)
HST was ordered 04/29/14. Please advise thanks

## 2014-05-28 ENCOUNTER — Encounter: Payer: Self-pay | Admitting: Pulmonary Disease

## 2014-05-28 NOTE — Telephone Encounter (Signed)
Pt will be using a hst machine from [facility] on 05/29/14 [name]

## 2014-05-29 ENCOUNTER — Ambulatory Visit (HOSPITAL_BASED_OUTPATIENT_CLINIC_OR_DEPARTMENT_OTHER): Payer: 59 | Attending: Pulmonary Disease | Admitting: *Deleted

## 2014-05-29 DIAGNOSIS — G4733 Obstructive sleep apnea (adult) (pediatric): Secondary | ICD-10-CM | POA: Insufficient documentation

## 2014-05-29 DIAGNOSIS — R5383 Other fatigue: Secondary | ICD-10-CM | POA: Insufficient documentation

## 2014-05-29 DIAGNOSIS — R0683 Snoring: Secondary | ICD-10-CM | POA: Diagnosis present

## 2014-05-29 DIAGNOSIS — G478 Other sleep disorders: Secondary | ICD-10-CM

## 2014-05-29 DIAGNOSIS — Z6827 Body mass index (BMI) 27.0-27.9, adult: Secondary | ICD-10-CM | POA: Diagnosis not present

## 2014-06-07 ENCOUNTER — Encounter: Payer: Self-pay | Admitting: Pulmonary Disease

## 2014-06-08 NOTE — Telephone Encounter (Signed)
RA please advise on the results of the sleep study for this pt.  Thanks  Allergies  Allergen Reactions  . Ansaid [Ocufen] Nausea And Vomiting  . Chantix [Varenicline Tartrate] Other (See Comments)    Gi intolerances   . Codeine Other (See Comments)    Gi intolerance   . Erythromycin Other (See Comments)    Gi intolerances     Current Outpatient Prescriptions on File Prior to Visit  Medication Sig Dispense Refill  . aspirin 325 MG EC tablet Take 325 mg by mouth every morning.     . eszopiclone (LUNESTA) 2 MG TABS tablet Take 1/2-1 tablet, immediately before bedtime 30 tablet 0  . glucosamine-chondroitin 500-400 MG tablet Take 1 tablet by mouth 2 (two) times daily.    Marland Kitchen ibuprofen (ADVIL,MOTRIN) 800 MG tablet Take 800 mg by mouth every 6 (six) hours as needed. For pain    . Multiple Vitamin (MULTIVITAMIN) tablet Take 1 tablet by mouth daily.    . naproxen sodium (ANAPROX) 220 MG tablet Take 440 mg by mouth 2 (two) times daily as needed (for pain).    . nitroGLYCERIN (NITROLINGUAL) 0.4 MG/SPRAY spray Place 1 spray under the tongue every 5 (five) minutes as needed. Chest pain      No current facility-administered medications on file prior to visit.

## 2014-06-10 ENCOUNTER — Telehealth: Payer: Self-pay | Admitting: Pulmonary Disease

## 2014-06-10 DIAGNOSIS — G4733 Obstructive sleep apnea (adult) (pediatric): Secondary | ICD-10-CM

## 2014-06-10 NOTE — Telephone Encounter (Signed)
Patient needing order for cpap trial.  Needing order sent to [facility].  [phone]

## 2014-06-10 NOTE — Telephone Encounter (Signed)
Home sleep study showed moderate obstructive sleep apnea, events were about 14 per hour. Would suggest C Pap titration study in the lab or auto C Pap trial at home. Our wait time on C Pap titration is about a month-hence I would suggest C Pap trial at home, if you are willing

## 2014-06-10 NOTE — Sleep Study (Signed)
    NAME: [name] DATE OF BIRTH:  19-Apr-1971 MEDICAL RECORD NUMBER [id number]  LOCATION: [facility]  PHYSICIAN: [place] OF STUDY: 05/29/2014  SLEEP STUDY TYPE: Home Sleep Test                REFERRING PHYSICIAN: Rigoberto Noel, MD  INDICATION FOR STUDY: Fatigue snoring and unrefreshing sleep,  EPWORTH SLEEPINESS SCORE:  17  HEIGHT:   69  WEIGHT:   180  BMI 27    The study was performed using the Stuart system. The channels recorded were airflow with a nasal pressure cannula, oxygen saturation using a pulse oximeter, thoracic and abdominal respiratory movements were recorded with RIP belts. Body position was also monitored  Total recording time was 419 minutes. There were a total of 11 obstructive apneas, 22 central apneas, 7 mixed apneas and 74 hypopneas for an apnea-hypopnea index of 16 per hour, The lowest desaturation was 82% with an average of 92%. He spent 16 minutes with a saturation less than 90%. The average heart rate was 70 bpm, the highest heart rate was 111 bpm  IMPRESSION: Mild-to-moderate obstructive sleep apnea with a few central events.    RECOMMENDATION:  Treatment options for this degree of sleep-disordered breathing including C Pap therapy or oral appliance . Note that home sleep study can underestimate sleep disordered breathing. C Pap titration study can be scheduled given the few central apneas that were noted.  Rigoberto Noel MD Diplomate, American Board of Sleep Medicine  ELECTRONICALLY SIGNED ON:  06/10/2014, 1:45 PM [place] PH: [phone]   FX: [phone] [place]

## 2014-06-10 NOTE — Telephone Encounter (Signed)
lmomtcb x 1 for the pt 

## 2014-06-10 NOTE — Telephone Encounter (Signed)
RA - what settings would you like ordered for this pt? He is willing to try CPAP. Thanks!

## 2014-06-10 NOTE — Telephone Encounter (Signed)
Pt returned call. Informed pt of the results per RA. Pt is willing to have CPAP trial at home. Order has been placed. Pt aware nothing further needed.

## 2014-06-11 ENCOUNTER — Telehealth: Payer: Self-pay | Admitting: Pulmonary Disease

## 2014-06-11 DIAGNOSIS — G4733 Obstructive sleep apnea (adult) (pediatric): Secondary | ICD-10-CM

## 2014-06-11 NOTE — Telephone Encounter (Signed)
5-12 cm, mask of choice, download in 4 wks

## 2014-06-11 NOTE — Telephone Encounter (Signed)
RA please advise of the pressure settings for the pt for the trial of cpap.  thanks

## 2014-06-15 NOTE — Telephone Encounter (Signed)
Order entered. Nothing further needed.  

## 2014-06-16 ENCOUNTER — Encounter: Payer: Self-pay | Admitting: Pulmonary Disease

## 2014-07-14 ENCOUNTER — Telehealth: Payer: Self-pay | Admitting: Pulmonary Disease

## 2014-07-14 NOTE — Telephone Encounter (Signed)
Per Dr. Elsworth Soho, ok to [facility] on 2/11.  Pt scheduled.  Nothing further needed.

## 2014-07-23 ENCOUNTER — Encounter: Payer: Self-pay | Admitting: Pulmonary Disease

## 2014-07-23 ENCOUNTER — Ambulatory Visit (INDEPENDENT_AMBULATORY_CARE_PROVIDER_SITE_OTHER): Payer: 59 | Admitting: Pulmonary Disease

## 2014-07-23 VITALS — BP 132/74 | HR 63 | Ht 69.0 in | Wt 179.0 lb

## 2014-07-23 DIAGNOSIS — Z9989 Dependence on other enabling machines and devices: Principal | ICD-10-CM

## 2014-07-23 DIAGNOSIS — G4733 Obstructive sleep apnea (adult) (pediatric): Secondary | ICD-10-CM

## 2014-07-23 NOTE — Patient Instructions (Signed)
Change to 8 cm & recheck download in 1 month Call as needed OK to stop [place]

## 2014-07-23 NOTE — Assessment & Plan Note (Signed)
Change to 8 cm & recheck download in 1 month Call as needed OK to stop lunesta  Weight loss encouraged, compliance with goal of at least 4-6 hrs every night is the expectation. Advised against medications with sedative side effects Cautioned against driving when sleepy - understanding that sleepiness will vary on a day to day basis

## 2014-07-23 NOTE — Progress Notes (Signed)
   Subjective:    Patient ID: [name], male    DOB: 08/04/1970, 44 y.o.   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  He has tapered himself off Celexa and metoprolol in the last 3 weeks. He also reports a dry cough, exposure to a patient with tuberculosis, QuantiFERON was negative. Chest x-ray performed today did not show any infiltrates. His father had lung cancer at age 88  >> melatonin, lunesta Home sleep study 05/2014 showed moderate obstructive sleep apnea, events were about 14 per hour.  AutoCPAP download 07/2014 >> good usage >7h, no residuals, avg 8 cm  Feels much improved, resting better, not sleepy in daytime, no snoring, adjusted well, used nasal pillows -- now Amara naso-oral mask Pr ok, no dryness    Review of Systems neg for any significant sore throat, dysphagia, itching, sneezing, nasal congestion or excess/ purulent secretions, fever, chills, sweats, unintended wt loss, pleuritic or exertional cp, hempoptysis, orthopnea pnd or change in chronic leg swelling. Also denies presyncope, palpitations, heartburn, abdominal pain, nausea, vomiting, diarrhea or change in bowel or urinary habits, dysuria,hematuria, rash, arthralgias, visual complaints, headache, numbness weakness or ataxia.      Objective:   Physical Exam  Gen. Pleasant, well-nourished, in no distress ENT - no lesions, no post nasal drip Neck: No JVD, no thyromegaly, no carotid bruits Lungs: no use of accessory muscles, no dullness to percussion, clear without rales or rhonchi  Cardiovascular: Rhythm regular, heart sounds  normal, no murmurs or gallops, no peripheral edema Musculoskeletal: No deformities, no cyanosis or clubbing        Assessment & Plan:

## 2014-08-21 ENCOUNTER — Encounter: Payer: Self-pay | Admitting: Pulmonary Disease

## 2014-09-23 ENCOUNTER — Encounter (HOSPITAL_COMMUNITY): Payer: Self-pay | Admitting: Emergency Medicine

## 2014-09-23 ENCOUNTER — Emergency Department (HOSPITAL_COMMUNITY)
Admission: EM | Admit: 2014-09-23 | Discharge: 2014-09-23 | Disposition: A | Discharge status: Home / Self Care | Payer: 59 | Attending: Emergency Medicine | Admitting: Emergency Medicine

## 2014-09-23 ENCOUNTER — Emergency Department (HOSPITAL_COMMUNITY): Payer: 59

## 2014-09-23 DIAGNOSIS — I2510 Atherosclerotic heart disease of native coronary artery without angina pectoris: Secondary | ICD-10-CM | POA: Diagnosis not present

## 2014-09-23 DIAGNOSIS — S4991XA Unspecified injury of right shoulder and upper arm, initial encounter: Secondary | ICD-10-CM | POA: Diagnosis not present

## 2014-09-23 DIAGNOSIS — Q245 Malformation of coronary vessels: Secondary | ICD-10-CM | POA: Diagnosis not present

## 2014-09-23 DIAGNOSIS — Z87891 Personal history of nicotine dependence: Secondary | ICD-10-CM | POA: Insufficient documentation

## 2014-09-23 DIAGNOSIS — Z8659 Personal history of other mental and behavioral disorders: Secondary | ICD-10-CM | POA: Diagnosis not present

## 2014-09-23 DIAGNOSIS — S39012A Strain of muscle, fascia and tendon of lower back, initial encounter: Secondary | ICD-10-CM | POA: Insufficient documentation

## 2014-09-23 DIAGNOSIS — Z79899 Other long term (current) drug therapy: Secondary | ICD-10-CM | POA: Insufficient documentation

## 2014-09-23 DIAGNOSIS — S29012A Strain of muscle and tendon of back wall of thorax, initial encounter: Secondary | ICD-10-CM | POA: Insufficient documentation

## 2014-09-23 DIAGNOSIS — Z87442 Personal history of urinary calculi: Secondary | ICD-10-CM | POA: Insufficient documentation

## 2014-09-23 DIAGNOSIS — I25119 Atherosclerotic heart disease of native coronary artery with unspecified angina pectoris: Secondary | ICD-10-CM | POA: Diagnosis not present

## 2014-09-23 DIAGNOSIS — Z9889 Other specified postprocedural states: Secondary | ICD-10-CM | POA: Diagnosis not present

## 2014-09-23 DIAGNOSIS — Z7982 Long term (current) use of aspirin: Secondary | ICD-10-CM | POA: Insufficient documentation

## 2014-09-23 DIAGNOSIS — Y9355 Activity, bike riding: Secondary | ICD-10-CM | POA: Insufficient documentation

## 2014-09-23 DIAGNOSIS — S4992XA Unspecified injury of left shoulder and upper arm, initial encounter: Secondary | ICD-10-CM | POA: Insufficient documentation

## 2014-09-23 DIAGNOSIS — S3992XA Unspecified injury of lower back, initial encounter: Secondary | ICD-10-CM | POA: Diagnosis present

## 2014-09-23 DIAGNOSIS — I10 Essential (primary) hypertension: Secondary | ICD-10-CM | POA: Diagnosis not present

## 2014-09-23 DIAGNOSIS — Y92410 Unspecified street and highway as the place of occurrence of the external cause: Secondary | ICD-10-CM | POA: Insufficient documentation

## 2014-09-23 DIAGNOSIS — Z8719 Personal history of other diseases of the digestive system: Secondary | ICD-10-CM | POA: Insufficient documentation

## 2014-09-23 DIAGNOSIS — Y998 Other external cause status: Secondary | ICD-10-CM | POA: Insufficient documentation

## 2014-09-23 DIAGNOSIS — J45909 Unspecified asthma, uncomplicated: Secondary | ICD-10-CM | POA: Insufficient documentation

## 2014-09-23 DIAGNOSIS — T148XXA Other injury of unspecified body region, initial encounter: Secondary | ICD-10-CM

## 2014-09-23 MED ORDER — IBUPROFEN 800 MG PO TABS
800.0000 mg | ORAL_TABLET | Freq: Once | ORAL | Status: AC
  Administered 2014-09-23: 800 mg via ORAL
  Filled 2014-09-23: qty 1

## 2014-09-23 MED ORDER — CYCLOBENZAPRINE HCL 10 MG PO TABS: 10.0000 mg | ORAL_TABLET | Freq: Two times a day (BID) | ORAL | Status: DC | PRN

## 2014-09-23 MED ORDER — IBUPROFEN 800 MG PO TABS: 800.0000 mg | ORAL_TABLET | Freq: Three times a day (TID) | ORAL | Status: DC
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
## 2014-10-17 ENCOUNTER — Telehealth: Payer: Self-pay | Admitting: Family

## 2014-10-17 DIAGNOSIS — H10509 Unspecified blepharoconjunctivitis, unspecified eye: Secondary | ICD-10-CM

## 2014-10-18 ENCOUNTER — Telehealth: Payer: Self-pay | Admitting: Family

## 2014-10-18 DIAGNOSIS — J209 Acute bronchitis, unspecified: Secondary | ICD-10-CM

## 2014-10-18 MED ORDER — POLYMYXIN B-TRIMETHOPRIM 10000-0.1 UNIT/ML-% OP SOLN: 1.0000 [drp] | OPHTHALMIC | Status: DC

## 2014-10-18 MED ORDER — BENZONATATE 100 MG PO CAPS: 100.0000 mg | ORAL_CAPSULE | Freq: Three times a day (TID) | ORAL | Status: AC | PRN

## 2014-10-18 NOTE — Progress Notes (Signed)
We are sorry that you are not feeling well.  Here is how we plan to help!   Based on what you have shared with me it looks like you have conjunctivitis.  Conjunctivitis is a common inflammatory or infectious condition of the eye that is often referred to as "pink eye".  In most cases it is contagious (viral or bacterial). However, not all conjunctivitis requires antibiotics (ex. Allergic).  We have made appropriate suggestions for you based upon your presentation.  I have prescribed Polytrim Ophthalmic drops 1-2 drops 4 times a day times 5 days  Pink eye can be highly contagious.  It is typically spread through direct contact with secretions, or contaminated objects or surfaces that one may have touched.  Strict handwashing is suggested with soap and water is urged.  If not available, use alcohol based had sanitizer.  Avoid unnecessary touching of the eye.  If you wear contact lenses, you will need to refrain from wearing them until you see no white discharge from the eye for at least 24 hours after being on medication.  You should see symptom improvement in 1-2 days after starting the medication regimen.  Call us if symptoms are not improved in 1-2 days.  Home Care:  Wash your hands often!  Do not wear your contacts until you complete your treatment plan.  Avoid sharing towels, bed linen, personal items with a person who has pink eye.  See attention for anyone in your home with similar symptoms.  Get Help Right Away If:  Your symptoms do not improve.  You develop blurred or loss of vision.  Your symptoms worsen (increased discharge, pain or redness)  Your e-visit answers were reviewed by a board certified advanced clinical practitioner to complete your personal care plan.  Depending on the condition, your plan could have included both over the counter or prescription medications.  If there is a problem please reply  once you have received a response from your provider.  Your safety is  important to [place].  If you have drug allergies check your prescription carefully.    You can use MyChart to ask questions about today's visit, request a non-urgent call back, or ask for a work or school excuse.  You will get an e-mail in the next two days asking about your experience.  I hope that your e-visit has been valuable and will speed your recovery. Thank you for using e-visits.

## 2014-10-18 NOTE — Progress Notes (Signed)
We are sorry that you are not feeling well.  Here is how we plan to help!  Based on what you have shared with me it looks like you have upper respiratory tract inflammation that has resulted in a significant cough.  Inflammation and infection in the upper respiratory tract is commonly called bronchitis and has four common causes:  Allergies, Viral Infections, Acid Reflux and Bacterial Infections.  Allergies, viruses and acid reflux are treated by controlling symptoms or eliminating the cause. An example might be a cough caused by taking certain blood pressure medications. You stop the cough by changing the medication. Another example might be a cough caused by acid reflux. Controlling the reflux helps control the cough.  Based on your presentation I believe you most likely have A cough due to bacteria.  When patients have a fever and a productive cough with a change in color or increased sputum production, we are concerned about bacterial bronchitis.  If left untreated it can progress to pneumonia.  If your symptoms do not improve with your treatment plan it is important that you contact your provider.  Per our phone conversation, it sounds like this is clearing up so I will treat the cough so you can sleep.    In addition you may use A non-prescription cough medication called Robitussin DAC. Take 2 teaspoons every 8 hours or Delsym: take 2 teaspoons every 12 hours., A non-prescription cough medication called Mucinex DM: take 2 tablets every 12 hours. and A prescription cough medication called Tessalon Perles 137m. You may take 1-2 capsules every 8 hours as needed for your cough.    HOME CARE . Only take medications as instructed by your medical team. . Complete the entire course of an antibiotic. . Drink plenty of fluids and get plenty of rest. . Avoid close contacts especially the very young and the elderly . Cover your mouth if you cough or cough into your sleeve. . Always remember to wash your  hands . A steam or ultrasonic humidifier can help congestion.    GET HELP RIGHT AWAY IF: . You develop worsening fever. . You become short of breath . You cough up blood. . Your symptoms persist after you have completed your treatment plan MAKE SURE YOU   Understand these instructions.  Will watch your condition.  Will get help right away if you are not doing well or get worse.  Your e-visit answers were reviewed by a board certified advanced clinical practitioner to complete your personal care plan.  Depending on the condition, your plan could have included both over the counter or prescription medications.  If there is a problem please reply  once you have received a response from your provider.  Your safety is important to [place]  If you have drug allergies check your prescription carefully.    You can use MyChart to ask questions about today's visit, request a non-urgent call back, or ask for a work or school excuse.  You will get an e-mail in the next two days asking about your experience.  I hope that your e-visit has been valuable and will speed your recovery. Thank you for using e-visits.

## 2014-11-06 ENCOUNTER — Encounter: Payer: Self-pay | Admitting: Pulmonary Disease

## 2014-11-06 ENCOUNTER — Ambulatory Visit (INDEPENDENT_AMBULATORY_CARE_PROVIDER_SITE_OTHER): Payer: 59 | Admitting: Pulmonary Disease

## 2014-11-06 VITALS — BP 100/78 | HR 79 | Ht 69.0 in | Wt 180.0 lb

## 2014-11-06 DIAGNOSIS — G4733 Obstructive sleep apnea (adult) (pediatric): Secondary | ICD-10-CM

## 2014-11-06 DIAGNOSIS — Z9989 Dependence on other enabling machines and devices: Principal | ICD-10-CM

## 2014-11-06 NOTE — Patient Instructions (Signed)
Your CPAP is set at 8 cm Supplies will be renewed x 1 year

## 2014-11-06 NOTE — Assessment & Plan Note (Signed)
Weight loss encouraged, compliance with goal of at least 4-6 hrs every night is the expectation. Advised against medications with sedative side effects Cautioned against driving when sleepy - understanding that sleepiness will vary on a day to day basis

## 2014-11-06 NOTE — Progress Notes (Signed)
   Subjective:    Patient ID: [name], male    DOB: July 28, 1970, 44 y.o.   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  He has tapered himself off Celexa and metoprolol   His father had lung cancer at age 10    11/06/2014  Chief Complaint  Patient presents with  . Follow-up    OSA: wears CPAP every night, still having daytime fatigue.  also, sore throat, cough, chest congestion   Off lunesta , sleeping well Feels much improved, resting better, not sleepy in daytime, no snoring, adjusted well, used nasal pillows -- now Amara naso-oral mask Pr ok, no dryness Bp low today - off metoprolol  Significant tests/ events  Home sleep study 05/2014 showed moderate obstructive sleep apnea, events were about 14 per hour.  AutoCPAP download 07/2014 >> good usage >7h, no residuals, avg 8 cm  10/2014 CPAP 8 cm>> no residulas or leak, good usage  Review of Systems neg for any significant sore throat, dysphagia, itching, sneezing, nasal congestion or excess/ purulent secretions, fever, chills, sweats, unintended wt loss, pleuritic or exertional cp, hempoptysis, orthopnea pnd or change in chronic leg swelling. Also denies presyncope, palpitations, heartburn, abdominal pain, nausea, vomiting, diarrhea or change in bowel or urinary habits, dysuria,hematuria, rash, arthralgias, visual complaints, headache, numbness weakness or ataxia.     Objective:   Physical Exam  Gen. Pleasant, well-nourished, in no distress ENT - no lesions, no post nasal drip Neck: No JVD, no thyromegaly, no carotid bruits Lungs: no use of accessory muscles, no dullness to percussion, clear without rales or rhonchi  Cardiovascular: Rhythm regular, heart sounds  normal, no murmurs or gallops, no peripheral edema Musculoskeletal: No deformities, no cyanosis or clubbing        Assessment & Plan:

## 2014-12-01 ENCOUNTER — Encounter: Payer: Self-pay | Admitting: Pulmonary Disease

## 2015-03-18 ENCOUNTER — Encounter: Payer: Self-pay | Admitting: Interventional Cardiology

## 2015-07-05 DIAGNOSIS — H4312 Vitreous hemorrhage, left eye: Secondary | ICD-10-CM | POA: Diagnosis not present

## 2015-07-05 DIAGNOSIS — H5213 Myopia, bilateral: Secondary | ICD-10-CM | POA: Diagnosis not present

## 2015-07-14 DIAGNOSIS — G4733 Obstructive sleep apnea (adult) (pediatric): Secondary | ICD-10-CM | POA: Diagnosis not present

## 2015-07-21 DIAGNOSIS — G4733 Obstructive sleep apnea (adult) (pediatric): Secondary | ICD-10-CM | POA: Diagnosis not present

## 2015-07-28 MED FILL — METOPROLOL SUCC ER 50 MG TA: 50 | 90 days supply | Qty: 90 | Fill #1

## 2015-09-21 MED FILL — CLOBETASOL 0.05% CREAM: 0.05 | 20 days supply | Qty: 60 | Fill #0

## 2015-10-12 ENCOUNTER — Encounter: Payer: Self-pay | Admitting: Adult Health

## 2015-10-12 ENCOUNTER — Ambulatory Visit (INDEPENDENT_AMBULATORY_CARE_PROVIDER_SITE_OTHER): Payer: 59 | Admitting: Adult Health

## 2015-10-12 ENCOUNTER — Telehealth: Payer: Self-pay | Admitting: Pulmonary Disease

## 2015-10-12 ENCOUNTER — Ambulatory Visit (INDEPENDENT_AMBULATORY_CARE_PROVIDER_SITE_OTHER)
Admission: RE | Admit: 2015-10-12 | Discharge: 2015-10-12 | Disposition: A | Discharge status: Home / Self Care | Payer: 59 | Source: Ambulatory Visit | Attending: Adult Health | Admitting: Adult Health

## 2015-10-12 VITALS — BP 124/84 | HR 52 | Temp 98.4°F | Ht 69.0 in | Wt 190.0 lb

## 2015-10-12 DIAGNOSIS — R0602 Shortness of breath: Secondary | ICD-10-CM

## 2015-10-12 DIAGNOSIS — Z9989 Dependence on other enabling machines and devices: Secondary | ICD-10-CM

## 2015-10-12 DIAGNOSIS — R0600 Dyspnea, unspecified: Secondary | ICD-10-CM | POA: Diagnosis not present

## 2015-10-12 DIAGNOSIS — G4733 Obstructive sleep apnea (adult) (pediatric): Secondary | ICD-10-CM

## 2015-10-12 NOTE — Assessment & Plan Note (Addendum)
Dyspnea ? Exercise induced asthma  Check PFT and CXR  If persists and above neg refer to back to cards.    Plan  Set up for PFT  Chest xray .  Follow up Dr. Elsworth Soho  In 4-6 weeks and As needed   Please contact office for sooner follow up if symptoms do not improve or worsen or seek emergency care

## 2015-10-12 NOTE — Assessment & Plan Note (Signed)
Well controlled on CPAP w/ residual daytime sleepiness.  Will check PFT to look for possible asthma component.  If unable to explain may need to look at [place] [facility] the great work on C Pap.  Do not drive if sleepy Set up for PFT  Chest xray .  Follow up Dr. Elsworth Soho  In 4-6 weeks and As needed

## 2015-10-12 NOTE — Progress Notes (Signed)
Quick Note:  Called spoke with pt. Reviewed results and recs. Pt voiced understanding and had no further questions. ______ 

## 2015-10-12 NOTE — Telephone Encounter (Signed)
RA- pt has been scheduled next available PFT with ROV, which is beyond the 4-6 week rov that TP has recommended.  Please advise if this is ok, or if this needs to be moved up.  Thanks.

## 2015-10-12 NOTE — Patient Instructions (Addendum)
Keep up the great work on C Pap.  Do not drive if sleepy Set up for PFT  Chest xray .  Follow up Dr. Elsworth Soho  In 4-6 weeks and As needed

## 2015-10-12 NOTE — Progress Notes (Signed)
Subjective:    Patient ID: [name], male    DOB: [birth year], 45 y.o.   MRN: [id number]  HPI 45 yo male with moderate OSA   TEST  Home sleep study 05/2014 showed moderate obstructive sleep apnea, events were about 14 per hour.  AutoCPAP download 07/2014 >> good usage >7h, no residuals, avg 8 cm  10/2014 CPAP 8 cm>> no residulas or leak, good usage  10/12/2015 Follow up : OSA  Pt presents for 1 year follow up for sleep apnea.  Doing well with CPAP .  Download shows excellent compliance with avg usage ~8hr . AHI 1.8 . Min leaks.  Feels rested in am but as day goes does get tired. Esp in afternoon gets sleepy.  Wonders if his asthma is coming back that is causing daytime issues.  Dx with exercise induced Asthma years ago. Was on ICS but not using for many years.  Former smoker- max 3 PPD , quit 2007 .  Does exercise 3-5 d week. Gets winded with stairs and first 59mn of exercise. HR goes up a lot first 5 min of exercise as exercise goes on he feels better. . Had PFT years ago. Does not remember the results.  Now has coughing and tightness intermittently with exercise and  sex life . Feels like he is going to have an  asthma attack during sex. Sometimes.  Denies chest pain, syncope, hemoptysis, calf pain or fever.  Says he is followed by Dr. STamala Julianin cardiology, last seen in 2015. Has known myocardial bridge. , says it was noted on previous card cath.    Past Medical History  Diagnosis Date  . Hypertension   . GERD (gastroesophageal reflux disease)   . Asthma   . Shortness of breath     with excertion  . Depression   . H/O hiatal hernia   . History of angina pectoris   . Myocardial bridge MID LAD  . Coronary artery disease CARDIOLOGIST - DR HDaneen Schick-- LAST VISIT OCT 2012    DENIES S & S  . Kidney calculi   . Left ureteral calculus   . Dysuria    Current Outpatient Prescriptions on File Prior to Visit  Medication Sig Dispense Refill  . glucosamine-chondroitin  500-400 MG tablet Take 1 tablet by mouth 2 (two) times daily.    .Marland Kitchenibuprofen (ADVIL,MOTRIN) 800 MG tablet Take 1 tablet (800 mg total) by mouth 3 (three) times daily. 21 tablet 0  . Multiple Vitamin (MULTIVITAMIN) tablet Take 1 tablet by mouth daily.    . naproxen sodium (ANAPROX) 220 MG tablet Take 440 mg by mouth 2 (two) times daily as needed (for pain).    . nitroGLYCERIN (NITROLINGUAL) 0.4 MG/SPRAY spray Place 1 spray under the tongue every 5 (five) minutes as needed. Chest pain     . aspirin 325 MG EC tablet Take 325 mg by mouth every morning. Reported on 10/12/2015    . benzonatate (TESSALON PERLES) 100 MG capsule Take 1-2 capsules (100-200 mg total) by mouth every 8 (eight) hours as needed for cough. (Patient not taking: Reported on 10/12/2015) 30 capsule 0  . citalopram (CELEXA) 20 MG tablet Take 20 mg by mouth daily. Reported on 10/12/2015    . cyclobenzaprine (FLEXERIL) 10 MG tablet Take 1 tablet (10 mg total) by mouth 2 (two) times daily as needed for muscle spasms. (Patient not taking: Reported on 10/12/2015) 20 tablet 0  . eszopiclone (LUNESTA) 2 MG TABS tablet Take 1/2-1 tablet, immediately  before bedtime (Patient not taking: Reported on 10/12/2015) 30 tablet 0   No current facility-administered medications on file prior to visit.       Review of Systems Constitutional:   No  weight loss, night sweats,  Fevers, chills,  +fatigue, or  lassitude.  HEENT:   No headaches,  Difficulty swallowing,  Tooth/dental problems, or  Sore throat,                No sneezing, itching, ear ache, nasal congestion, post nasal drip,   CV:  No chest pain,  Orthopnea, PND, swelling in lower extremities, anasarca, dizziness, palpitations, syncope.   GI  No heartburn, indigestion, abdominal pain, nausea, vomiting, diarrhea, change in bowel habits, loss of appetite, bloody stools.   Resp:  .  No excess mucus, no productive cough,  No non-productive cough,  No coughing up of blood.  No change in color of mucus.   No wheezing.  No chest wall deformity  Skin: no rash or lesions.  GU: no dysuria, change in color of urine, no urgency or frequency.  No flank pain, no hematuria   MS:  No joint pain or swelling.  No decreased range of motion.  No back pain.  Psych:  No change in mood or affect. No depression or anxiety.  No memory loss.         Objective:   Physical Exam   Filed Vitals:   10/12/15 1453  BP: 124/84  Pulse: 52  Temp: 98.4 F (36.9 C)  TempSrc: Oral  Height: 5' 9"  (1.753 m)  Weight: 190 lb (86.183 kg)  SpO2: 97%   GEN: A/Ox3; pleasant , NAD, well nourished   HEENT:  [place]/AT,  EACs-clear, TMs-wnl, NOSE-clear, THROAT-clear, no lesions, no postnasal drip or exudate noted.   NECK:  Supple w/ fair ROM; no JVD; normal carotid impulses w/o bruits; no thyromegaly or nodules palpated; no lymphadenopathy.  RESP  Clear  P & A; w/o, wheezes/ rales/ or rhonchi.no accessory muscle use, no dullness to percussion  CARD:  RRR, no m/r/g  , no peripheral edema, pulses intact, no cyanosis or clubbing.  GI:   Soft & nt; nml bowel sounds; no organomegaly or masses detected.  Musco: Warm bil, no deformities or joint swelling noted.   Neuro: alert, no focal deficits noted.    Skin: Warm, no lesions or rashes  Tammy Parrett NP-C  [facility]  10/12/2015      Assessment & Plan:

## 2015-10-12 NOTE — Telephone Encounter (Signed)
Move up- can get PFT & OV separate visits

## 2015-10-13 ENCOUNTER — Encounter: Payer: Self-pay | Admitting: Pulmonary Disease

## 2015-10-13 NOTE — Telephone Encounter (Signed)
Spoke with pt and advised that appt dates scheduled are fine to keep.  Pt verbalized understanding.

## 2015-10-13 NOTE — Telephone Encounter (Signed)
Dr Elsworth Soho, the following is an email sent from the pt:  Hi Dr. Elsworth Soho  When they called me the CXR results they mentioned pleural thickening consistent with scarring.  This is a new finding for my CXR.  My dad was diagnosed around age 45 with lung cancer and passed away at 36.  Initially he was being treated for bronchitis before they did a CT and found the tumor.  Would it be possible for me to have a CT scan to further evaluate?  My pulmonary function tests and follow up appointment with you is not until July (the first they had available) and I was wondering if I could go ahead with the CT if you felt it was needed to evaluate this new finding.  Thanks!  [name]   Please advise recs, thanks!

## 2015-10-20 NOTE — Progress Notes (Signed)
Reviewed & agree with plan  

## 2015-10-21 DIAGNOSIS — G4733 Obstructive sleep apnea (adult) (pediatric): Secondary | ICD-10-CM | POA: Diagnosis not present

## 2015-11-04 ENCOUNTER — Encounter: Payer: Self-pay | Admitting: Adult Health

## 2015-11-04 DIAGNOSIS — G4719 Other hypersomnia: Secondary | ICD-10-CM

## 2015-11-05 NOTE — Telephone Encounter (Signed)
That is fine , set up MSLT , make sure he has follow up with Dr. Elsworth Soho  To follow up

## 2015-11-05 NOTE — Telephone Encounter (Signed)
TP - please advise. Thanks. 

## 2015-11-07 MED FILL — METOPROLOL SUCC ER 50 MG TA: 50 | 90 days supply | Qty: 90 | Fill #2

## 2015-11-15 MED FILL — CITALOPRAM HBR 20 MG TABLET: 20 | 90 days supply | Qty: 90 | Fill #2

## 2015-11-25 ENCOUNTER — Telehealth: Payer: Self-pay | Admitting: Pulmonary Disease

## 2015-11-25 NOTE — Telephone Encounter (Signed)
LMTCB

## 2015-11-26 NOTE — Telephone Encounter (Signed)
lmtcb x2 for pt. 

## 2015-11-29 ENCOUNTER — Encounter (HOSPITAL_BASED_OUTPATIENT_CLINIC_OR_DEPARTMENT_OTHER): Payer: 59

## 2015-11-29 NOTE — Telephone Encounter (Signed)
Spoke with [name] in sleep lab to verify message below-states she thought that pt was requesting a npsg instead of a cpap titration study-states she will forward message to [facility] for verification and will call back with clarification.  Will await phone call from sleep lab

## 2015-11-30 ENCOUNTER — Ambulatory Visit (HOSPITAL_BASED_OUTPATIENT_CLINIC_OR_DEPARTMENT_OTHER): Payer: 59 | Attending: Adult Health | Admitting: Pulmonary Disease

## 2015-11-30 DIAGNOSIS — R400 Somnolence: Secondary | ICD-10-CM | POA: Diagnosis not present

## 2015-11-30 DIAGNOSIS — Z9989 Dependence on other enabling machines and devices: Secondary | ICD-10-CM

## 2015-11-30 DIAGNOSIS — G4733 Obstructive sleep apnea (adult) (pediatric): Secondary | ICD-10-CM | POA: Diagnosis not present

## 2015-11-30 DIAGNOSIS — G4719 Other hypersomnia: Secondary | ICD-10-CM | POA: Insufficient documentation

## 2015-12-01 NOTE — Telephone Encounter (Signed)
Attempted to contact the sleep lab. No answer. Will try back.

## 2015-12-03 NOTE — Telephone Encounter (Signed)
Patient scheduled on 11/30/15, sleep study results are being reviewed in [place]. Nothing further needed.

## 2015-12-06 NOTE — Procedures (Signed)
Patient Name: [name], [name] Date: 11/30/2015 Gender: Male D.O.B: [birth year] Age (years): 44 Referring Provider: Tammy Parrett Height (inches): 69 Interpreting Physician: Kara Mead MD, ABSM Weight (lbs): 185 RPSGT: Jacolyn Reedy BMI: 27 MRN: [id number] Neck Size: 16.50 CLINICAL INFORMATION Sleep Study Type: MSLT The patient was referred to the sleep center for evaluation of residual daytime sleepiness inspite of good CPAP compliance.   SLEEP STUDY TECHNIQUE A Multiple Sleep Latency Test was performed after an overnight polysomnogram according to the AASM scoring manual v2.3 (April 2016) and clinical guidelines. Five nap opportunities occurred over the course of the test which followed an overnight polysomnogram. The channels recorded and monitored were frontal, central, and occipital electroencephalography (EEG), right and left electrooculogram (EOG), chin electromyography (EMG), and electrocardiogram (EKG).   IMPRESSIONS - Total number of naps attempted: 4.00 . Total number of naps with sleep attained: . The Mean Sleep Latency was 11:47 minutes. There were sleep-onset REM periods. - The patient does not appear to have pathologic sleepiness, evidenced by a short mean sleep latency (8 minutes or less) on this MSLT. - No sleep onset REMs were noted during this MSLT.   DIAGNOSIS - Normal study   RECOMMENDATIONS - Return for follow up to evaluate other causes of excessive daytime sleepiness.   Kara Mead MD. [facility]. [facility]    12/06/2015

## 2015-12-08 ENCOUNTER — Telehealth: Payer: Self-pay | Admitting: Adult Health

## 2015-12-08 ENCOUNTER — Encounter: Payer: Self-pay | Admitting: Adult Health

## 2015-12-08 DIAGNOSIS — G4733 Obstructive sleep apnea (adult) (pediatric): Secondary | ICD-10-CM

## 2015-12-08 DIAGNOSIS — Z9989 Dependence on other enabling machines and devices: Principal | ICD-10-CM

## 2015-12-08 NOTE — Telephone Encounter (Signed)
Patient returning our call. He can be reached at [employer]

## 2015-12-08 NOTE — Telephone Encounter (Signed)
Hi [name]. I see the results of the MSLT in MyChart and it appears negative. They recommend a full overnight study at the center (my previous study was a home study). Can we go ahead and request this? I know they are out to August for appointments. Thanks ---  Please advise Dr. Elsworth Soho as TP is out of the office. thanks

## 2015-12-08 NOTE — Progress Notes (Signed)
Quick Note:  LVM for pt to return call ______ 

## 2015-12-08 NOTE — Telephone Encounter (Signed)
Patient email was sent to pt. Called and confirmed he did receive this and had no questions

## 2015-12-08 NOTE — Telephone Encounter (Signed)
Sleep test was normal . No sign of narcolepsy.  Follow up with Dr. Elsworth Soho  As planned and As needed   Please contact office for sooner follow up if symptoms do not improve or worsen or seek emergency care

## 2015-12-16 DIAGNOSIS — G4730 Sleep apnea, unspecified: Secondary | ICD-10-CM | POA: Diagnosis not present

## 2016-01-05 ENCOUNTER — Other Ambulatory Visit: Payer: Self-pay | Admitting: Pulmonary Disease

## 2016-01-05 DIAGNOSIS — R0600 Dyspnea, unspecified: Secondary | ICD-10-CM

## 2016-01-06 ENCOUNTER — Ambulatory Visit (INDEPENDENT_AMBULATORY_CARE_PROVIDER_SITE_OTHER): Payer: 59 | Admitting: Pulmonary Disease

## 2016-01-06 ENCOUNTER — Encounter: Payer: Self-pay | Admitting: Pulmonary Disease

## 2016-01-06 DIAGNOSIS — G4733 Obstructive sleep apnea (adult) (pediatric): Secondary | ICD-10-CM

## 2016-01-06 DIAGNOSIS — R0600 Dyspnea, unspecified: Secondary | ICD-10-CM

## 2016-01-06 DIAGNOSIS — Z9989 Dependence on other enabling machines and devices: Principal | ICD-10-CM

## 2016-01-06 LAB — PULMONARY FUNCTION TEST
DL/VA % PRED: 94 %
DL/VA: 4.33 ml/min/mmHg/L
DLCO cor % pred: 98 %
DLCO cor: 30.64 ml/min/mmHg
DLCO unc % pred: 102 %
DLCO unc: 31.86 ml/min/mmHg
FEF 25-75 Post: 2.72 L/sec
FEF 25-75 Pre: 2.83 L/sec
FEF2575-%CHANGE-POST: -3 %
FEF2575-%Pred-Post: 74 %
FEF2575-%Pred-Pre: 77 %
FEV1-%Change-Post: 0 %
FEV1-%PRED-PRE: 95 %
FEV1-%Pred-Post: 95 %
FEV1-POST: 3.76 L
FEV1-Pre: 3.77 L
FEV1FVC-%CHANGE-POST: 7 %
FEV1FVC-%Pred-Pre: 92 %
FEV6-%Change-Post: -5 %
FEV6-%PRED-PRE: 103 %
FEV6-%Pred-Post: 97 %
FEV6-POST: 4.76 L
FEV6-PRE: 5.06 L
FEV6FVC-%Change-Post: 1 %
FEV6FVC-%PRED-POST: 103 %
FEV6FVC-%PRED-PRE: 101 %
FVC-%Change-Post: -7 %
FVC-%Pred-Post: 94 %
FVC-%Pred-Pre: 102 %
FVC-POST: 4.76 L
FVC-Pre: 5.15 L
POST FEV6/FVC RATIO: 100 %
PRE FEV1/FVC RATIO: 73 %
Post FEV1/FVC ratio: 79 %
Pre FEV6/FVC Ratio: 98 %
RV % PRED: 104 %
RV: 1.96 L
TLC % PRED: 107 %
TLC: 7.23 L

## 2016-01-06 NOTE — Assessment & Plan Note (Signed)
CPAP 8 cm  Weight loss encouraged, compliance with goal of at least 4-6 hrs every night is the expectation. Advised against medications with sedative side effects Cautioned against driving when sleepy - understanding that sleepiness will vary on a day to day basis

## 2016-01-06 NOTE — Assessment & Plan Note (Signed)
Normal PFTs-no bronchodilator response I doubt that this is exercise-induced asthma Observation for now

## 2016-01-06 NOTE — Patient Instructions (Signed)
Lung function is good-no evidence of asthma CPAP is set at 8 cm

## 2016-01-06 NOTE — Progress Notes (Signed)
   Subjective:    Patient ID: [name], male    DOB: 1971-02-02, 45 y.o.   MRN: [id number]  HPI  45 yo male with moderate OSA   01/06/2016   Chief Complaint  Patient presents with  . Follow-up    PFT done today.  Doing well on CPAP machine.  daytime sleepiness, coughing (dry), chest tightness.    He complains of dyspnea while climbing stairs and one on one occasion after sex-this was bad enough for him to consider  Going to the ER but then subsided spontaneously He has a family history of lung cancer in his father He has a myocardial bridge and has been evaluated by cardiolog He can bike for long distances on level ground without shortness of breath  Dx with exercise induced Asthma years ago. Was on ICS but not using for many years.  Former smoker- max 3 PPD , quit 2007 .   I have reviewed all relevant imaging, labs & test data & reconciled meds   Significant tests/ events   Home sleep study 05/2014 showed moderate obstructive sleep apnea, events were about 14 per hour.  AutoCPAP download 07/2014 >> good usage >7h, no residuals, avg 8 cm  10/2014 CPAP 8 cm>> no residulas or leak, good usage PFTs 12/2015 nml   Download  10/2015 excellent compliance with avg usage ~8hr . AHI 1.8 . Min leaks.    Review of Systems neg for any significant sore throat, dysphagia, itching, sneezing, nasal congestion or excess/ purulent secretions, fever, chills, sweats, unintended wt loss, pleuritic or exertional cp, hempoptysis, orthopnea pnd or change in chronic leg swelling. Also denies presyncope, palpitations, heartburn, abdominal pain, nausea, vomiting, diarrhea or change in bowel or urinary habits, dysuria,hematuria, rash, arthralgias, visual complaints, headache, numbness weakness or ataxia.     Objective:   Physical Exam   Gen. Pleasant, well-nourished, in no distress ENT - no lesions, no post nasal drip Neck: No JVD, no thyromegaly, no carotid bruits Lungs: no use of  accessory muscles, no dullness to percussion, clear without rales or rhonchi  Cardiovascular: Rhythm regular, heart sounds  normal, no murmurs or gallops, no peripheral edema Musculoskeletal: No deformities, no cyanosis or clubbing         Assessment & Plan:

## 2016-01-24 DIAGNOSIS — H1012 Acute atopic conjunctivitis, left eye: Secondary | ICD-10-CM | POA: Diagnosis not present

## 2016-01-25 DIAGNOSIS — G4733 Obstructive sleep apnea (adult) (pediatric): Secondary | ICD-10-CM | POA: Diagnosis not present

## 2016-01-28 DIAGNOSIS — Z833 Family history of diabetes mellitus: Secondary | ICD-10-CM | POA: Diagnosis not present

## 2016-01-28 DIAGNOSIS — Z0001 Encounter for general adult medical examination with abnormal findings: Secondary | ICD-10-CM | POA: Diagnosis not present

## 2016-01-28 DIAGNOSIS — I10 Essential (primary) hypertension: Secondary | ICD-10-CM | POA: Diagnosis not present

## 2016-01-28 DIAGNOSIS — E785 Hyperlipidemia, unspecified: Secondary | ICD-10-CM | POA: Diagnosis not present

## 2016-02-07 MED FILL — METOPROLOL SUCC ER 50 MG TA: 50 | 90 days supply | Qty: 90 | Fill #0

## 2016-02-07 MED FILL — CITALOPRAM HBR 20 MG TABLET: 20 | 90 days supply | Qty: 90 | Fill #0

## 2016-03-16 ENCOUNTER — Other Ambulatory Visit (HOSPITAL_COMMUNITY): Payer: Self-pay | Admitting: Orthopedic Surgery

## 2016-03-16 DIAGNOSIS — M25561 Pain in right knee: Principal | ICD-10-CM

## 2016-03-16 DIAGNOSIS — M25562 Pain in left knee: Principal | ICD-10-CM

## 2016-03-16 DIAGNOSIS — G8929 Other chronic pain: Secondary | ICD-10-CM

## 2016-03-27 ENCOUNTER — Ambulatory Visit (HOSPITAL_COMMUNITY)
Admission: RE | Admit: 2016-03-27 | Discharge: 2016-03-27 | Disposition: A | Discharge status: Home / Self Care | Payer: 59 | Source: Ambulatory Visit | Attending: Orthopedic Surgery | Admitting: Orthopedic Surgery

## 2016-03-27 ENCOUNTER — Other Ambulatory Visit (HOSPITAL_COMMUNITY): Payer: Self-pay | Admitting: Orthopedic Surgery

## 2016-03-27 DIAGNOSIS — M67864 Other specified disorders of tendon, left knee: Secondary | ICD-10-CM | POA: Diagnosis not present

## 2016-03-27 DIAGNOSIS — M25561 Pain in right knee: Secondary | ICD-10-CM | POA: Diagnosis not present

## 2016-03-27 DIAGNOSIS — M2392 Unspecified internal derangement of left knee: Secondary | ICD-10-CM

## 2016-03-27 DIAGNOSIS — M67461 Ganglion, right knee: Secondary | ICD-10-CM | POA: Insufficient documentation

## 2016-03-27 DIAGNOSIS — M25562 Pain in left knee: Principal | ICD-10-CM

## 2016-03-27 DIAGNOSIS — M2391 Unspecified internal derangement of right knee: Secondary | ICD-10-CM

## 2016-03-27 DIAGNOSIS — G8929 Other chronic pain: Secondary | ICD-10-CM

## 2016-03-27 DIAGNOSIS — M179 Osteoarthritis of knee, unspecified: Secondary | ICD-10-CM | POA: Diagnosis not present

## 2016-03-27 DIAGNOSIS — M949 Disorder of cartilage, unspecified: Secondary | ICD-10-CM | POA: Diagnosis not present

## 2016-03-28 DIAGNOSIS — G43009 Migraine without aura, not intractable, without status migrainosus: Secondary | ICD-10-CM | POA: Diagnosis not present

## 2016-03-28 DIAGNOSIS — R29898 Other symptoms and signs involving the musculoskeletal system: Secondary | ICD-10-CM | POA: Diagnosis not present

## 2016-03-28 DIAGNOSIS — H938X1 Other specified disorders of right ear: Secondary | ICD-10-CM | POA: Diagnosis not present

## 2016-03-29 ENCOUNTER — Other Ambulatory Visit (HOSPITAL_COMMUNITY): Payer: Self-pay | Admitting: Family Medicine

## 2016-03-29 DIAGNOSIS — G43009 Migraine without aura, not intractable, without status migrainosus: Secondary | ICD-10-CM

## 2016-03-29 DIAGNOSIS — H938X1 Other specified disorders of right ear: Secondary | ICD-10-CM

## 2016-03-29 DIAGNOSIS — R29898 Other symptoms and signs involving the musculoskeletal system: Secondary | ICD-10-CM

## 2016-04-03 ENCOUNTER — Ambulatory Visit (HOSPITAL_COMMUNITY)
Admission: RE | Admit: 2016-04-03 | Discharge: 2016-04-03 | Disposition: A | Discharge status: Home / Self Care | Payer: 59 | Source: Ambulatory Visit | Attending: Family Medicine | Admitting: Family Medicine

## 2016-04-03 DIAGNOSIS — G43009 Migraine without aura, not intractable, without status migrainosus: Secondary | ICD-10-CM | POA: Insufficient documentation

## 2016-04-03 DIAGNOSIS — H938X1 Other specified disorders of right ear: Secondary | ICD-10-CM

## 2016-04-03 DIAGNOSIS — M94261 Chondromalacia, right knee: Secondary | ICD-10-CM | POA: Diagnosis not present

## 2016-04-03 DIAGNOSIS — G43909 Migraine, unspecified, not intractable, without status migrainosus: Secondary | ICD-10-CM | POA: Diagnosis not present

## 2016-04-03 DIAGNOSIS — R29898 Other symptoms and signs involving the musculoskeletal system: Secondary | ICD-10-CM | POA: Diagnosis not present

## 2016-04-03 DIAGNOSIS — M25561 Pain in right knee: Secondary | ICD-10-CM | POA: Diagnosis not present

## 2016-04-03 DIAGNOSIS — M94262 Chondromalacia, left knee: Secondary | ICD-10-CM | POA: Diagnosis not present

## 2016-04-03 MED ORDER — GADOBENATE DIMEGLUMINE 529 MG/ML IV SOLN
20.0000 mL | Freq: Once | INTRAVENOUS | Status: AC | PRN
  Administered 2016-04-03: 17 mL via INTRAVENOUS

## 2016-04-05 DIAGNOSIS — G43009 Migraine without aura, not intractable, without status migrainosus: Secondary | ICD-10-CM | POA: Diagnosis not present

## 2016-04-05 MED FILL — SUMATRIPTAN SUCC 25 MG TAB: 25 | 30 days supply | Qty: 9 | Fill #0

## 2016-04-11 DIAGNOSIS — H6983 Other specified disorders of Eustachian tube, bilateral: Secondary | ICD-10-CM | POA: Diagnosis not present

## 2016-04-11 DIAGNOSIS — H9313 Tinnitus, bilateral: Secondary | ICD-10-CM | POA: Diagnosis not present

## 2016-04-11 MED FILL — FLUTICASONE PROP 50 MCG SPR: 50 | 30 days supply | Qty: 16 | Fill #0

## 2016-05-07 MED FILL — METOPROLOL SUCC ER 50 MG TA: 50 | 90 days supply | Qty: 90 | Fill #1

## 2016-05-08 DIAGNOSIS — M25561 Pain in right knee: Secondary | ICD-10-CM | POA: Diagnosis not present

## 2016-05-08 DIAGNOSIS — M25562 Pain in left knee: Secondary | ICD-10-CM | POA: Diagnosis not present

## 2016-05-08 DIAGNOSIS — G8929 Other chronic pain: Secondary | ICD-10-CM | POA: Diagnosis not present

## 2016-05-08 DIAGNOSIS — M94261 Chondromalacia, right knee: Secondary | ICD-10-CM | POA: Diagnosis not present

## 2016-05-08 DIAGNOSIS — M94262 Chondromalacia, left knee: Secondary | ICD-10-CM | POA: Diagnosis not present

## 2016-05-08 MED FILL — CITALOPRAM HBR 20 MG TABLET: 20 | 90 days supply | Qty: 90 | Fill #1

## 2016-05-15 DIAGNOSIS — M94262 Chondromalacia, left knee: Secondary | ICD-10-CM | POA: Diagnosis not present

## 2016-05-15 DIAGNOSIS — M94261 Chondromalacia, right knee: Secondary | ICD-10-CM | POA: Diagnosis not present

## 2016-05-22 DIAGNOSIS — M25561 Pain in right knee: Secondary | ICD-10-CM | POA: Diagnosis not present

## 2016-05-22 DIAGNOSIS — G8929 Other chronic pain: Secondary | ICD-10-CM | POA: Diagnosis not present

## 2016-05-22 DIAGNOSIS — M25562 Pain in left knee: Secondary | ICD-10-CM | POA: Diagnosis not present

## 2016-05-22 MED FILL — FLUTICASONE PROP 50 MCG SPR: 50 | 30 days supply | Qty: 16 | Fill #1

## 2016-05-23 DIAGNOSIS — H903 Sensorineural hearing loss, bilateral: Secondary | ICD-10-CM | POA: Diagnosis not present

## 2016-05-23 DIAGNOSIS — H6983 Other specified disorders of Eustachian tube, bilateral: Secondary | ICD-10-CM | POA: Diagnosis not present

## 2016-06-09 DIAGNOSIS — G4733 Obstructive sleep apnea (adult) (pediatric): Secondary | ICD-10-CM | POA: Diagnosis not present

## 2016-06-22 ENCOUNTER — Telehealth: Payer: 59 | Admitting: Physician Assistant

## 2016-06-22 DIAGNOSIS — H0289 Other specified disorders of eyelid: Secondary | ICD-10-CM

## 2016-06-22 DIAGNOSIS — H538 Other visual disturbances: Secondary | ICD-10-CM

## 2016-06-22 DIAGNOSIS — H1032 Unspecified acute conjunctivitis, left eye: Secondary | ICD-10-CM | POA: Diagnosis not present

## 2016-06-22 DIAGNOSIS — H00025 Hordeolum internum left lower eyelid: Secondary | ICD-10-CM | POA: Diagnosis not present

## 2016-06-22 DIAGNOSIS — H02849 Edema of unspecified eye, unspecified eyelid: Secondary | ICD-10-CM

## 2016-06-22 MED FILL — NEO/POLYMYXIN/DEXAMETH DROP: 3.5-10000-0 | 5 days supply | Qty: 5 | Fill #0

## 2016-06-22 NOTE — Progress Notes (Signed)
Based on what you shared with me it looks like you have a serious condition that should be evaluated in a face to face office visit.  I can see the swelling and area where the stye is located. Giving blurring of vision you need to be seen in office for evaluation.  NOTE: Even if you have entered your credit card information for this eVisit, you will not be charged.   If you are having a true medical emergency please call 911.  If you need an urgent face to face visit, [place] has four urgent care centers for your convenience.  If you need care fast and have a high deductible or no insurance consider:   [email or web address]  [phone]  [address] [place], [place] [address] 8 am to 8 pm Monday-Friday 10 am to 4 pm Saturday-Sunday   The following sites will take your  insurance:    . [facility] [place] a Provider at this Location  [address] [place], [place] [address] . 10 am to 8 pm Monday-Friday . 12 pm to 8 pm Saturday-Sunday   . [facility] at [place] a Provider at this Location  [address], [place], [place] [address] . 8 am to 8 pm Monday-Friday . 9 am to 6 pm Saturday . 11 am to 6 pm Sunday   . [facility] at [place] Get Driving Directions  [address] [place],  [address] . 8 am to 8 pm Monday-Friday . 8 am to 4 pm Saturday-Sunday   . [facility] (walk-ins welcome, or call for a scheduled time)  [phone]  Get Driving Directions Find a Provider at this Location  [place],  [address] . 8 am to 8:30 pm Monday-Thursday . 8 am to 6 pm Friday . 8 am to 4 pm Saturday-Sunday   Your e-visit answers were reviewed by a board certified advanced clinical practitioner to complete your personal care  plan.  Thank you for using e-Visits.

## 2016-07-07 DIAGNOSIS — Z0100 Encounter for examination of eyes and vision without abnormal findings: Secondary | ICD-10-CM | POA: Diagnosis not present

## 2016-07-07 DIAGNOSIS — M94261 Chondromalacia, right knee: Secondary | ICD-10-CM | POA: Diagnosis not present

## 2016-07-07 DIAGNOSIS — M25562 Pain in left knee: Secondary | ICD-10-CM | POA: Diagnosis not present

## 2016-07-07 DIAGNOSIS — G8929 Other chronic pain: Secondary | ICD-10-CM | POA: Diagnosis not present

## 2016-07-07 DIAGNOSIS — M25561 Pain in right knee: Secondary | ICD-10-CM | POA: Diagnosis not present

## 2016-08-07 MED FILL — METOPROLOL SUCC ER 50 MG TA: 50 | 90 days supply | Qty: 90 | Fill #2

## 2016-08-07 MED FILL — CITALOPRAM HBR 20 MG TABLET: 20 | 90 days supply | Qty: 90 | Fill #2

## 2016-09-08 DIAGNOSIS — G4733 Obstructive sleep apnea (adult) (pediatric): Secondary | ICD-10-CM | POA: Diagnosis not present

## 2016-09-26 DIAGNOSIS — K449 Diaphragmatic hernia without obstruction or gangrene: Secondary | ICD-10-CM | POA: Diagnosis not present

## 2016-09-26 DIAGNOSIS — R5383 Other fatigue: Secondary | ICD-10-CM | POA: Diagnosis not present

## 2016-09-26 DIAGNOSIS — R1013 Epigastric pain: Secondary | ICD-10-CM | POA: Diagnosis not present

## 2016-09-26 DIAGNOSIS — K219 Gastro-esophageal reflux disease without esophagitis: Secondary | ICD-10-CM | POA: Diagnosis not present

## 2016-09-26 MED FILL — OMEPRAZOLE DR 40 MG CAPSULE: 40 | 30 days supply | Qty: 30 | Fill #0

## 2016-10-17 DIAGNOSIS — K219 Gastro-esophageal reflux disease without esophagitis: Secondary | ICD-10-CM | POA: Diagnosis not present

## 2016-10-20 MED FILL — OMEPRAZOLE DR 40 MG CAPSULE: 40 | 30 days supply | Qty: 30 | Fill #1

## 2016-10-20 MED FILL — SUMATRIPTAN SUCC 25 MG TAB: 25 | 30 days supply | Qty: 10 | Fill #1

## 2016-10-22 DIAGNOSIS — N410 Acute prostatitis: Secondary | ICD-10-CM | POA: Diagnosis not present

## 2016-11-01 DIAGNOSIS — J028 Acute pharyngitis due to other specified organisms: Secondary | ICD-10-CM | POA: Diagnosis not present

## 2016-11-01 DIAGNOSIS — J029 Acute pharyngitis, unspecified: Secondary | ICD-10-CM | POA: Diagnosis not present

## 2016-11-10 DIAGNOSIS — K219 Gastro-esophageal reflux disease without esophagitis: Secondary | ICD-10-CM | POA: Diagnosis not present

## 2016-11-10 DIAGNOSIS — K2930 Chronic superficial gastritis without bleeding: Secondary | ICD-10-CM | POA: Diagnosis not present

## 2016-11-13 MED FILL — CITALOPRAM HBR 20 MG TABLET: 20 | 90 days supply | Qty: 90 | Fill #0

## 2016-11-13 MED FILL — METOPROLOL SUCC ER 50 MG TA: 50 | 90 days supply | Qty: 90 | Fill #0

## 2016-11-13 MED FILL — OMEPRAZOLE DR 40 MG CAPSULE: 40 | 30 days supply | Qty: 30 | Fill #0

## 2016-12-24 MED FILL — OMEPRAZOLE DR 40 MG CAPSULE: 40 | 30 days supply | Qty: 30 | Fill #1

## 2016-12-27 DIAGNOSIS — H6983 Other specified disorders of Eustachian tube, bilateral: Secondary | ICD-10-CM | POA: Diagnosis not present

## 2016-12-27 DIAGNOSIS — H9313 Tinnitus, bilateral: Secondary | ICD-10-CM | POA: Diagnosis not present

## 2016-12-29 ENCOUNTER — Telehealth: Payer: Self-pay

## 2017-01-05 ENCOUNTER — Encounter: Payer: Self-pay | Admitting: Family Medicine

## 2017-01-05 ENCOUNTER — Ambulatory Visit (INDEPENDENT_AMBULATORY_CARE_PROVIDER_SITE_OTHER): Payer: 59 | Admitting: Family Medicine

## 2017-01-05 VITALS — BP 122/82 | HR 62 | Temp 97.9°F | Resp 16 | Ht 69.0 in | Wt 186.0 lb

## 2017-01-05 DIAGNOSIS — R079 Chest pain, unspecified: Secondary | ICD-10-CM

## 2017-01-05 DIAGNOSIS — E785 Hyperlipidemia, unspecified: Secondary | ICD-10-CM

## 2017-01-05 DIAGNOSIS — Z8669 Personal history of other diseases of the nervous system and sense organs: Secondary | ICD-10-CM | POA: Diagnosis not present

## 2017-01-05 DIAGNOSIS — K219 Gastro-esophageal reflux disease without esophagitis: Secondary | ICD-10-CM

## 2017-01-05 DIAGNOSIS — Q245 Malformation of coronary vessels: Secondary | ICD-10-CM

## 2017-01-05 DIAGNOSIS — F329 Major depressive disorder, single episode, unspecified: Secondary | ICD-10-CM | POA: Diagnosis not present

## 2017-01-05 DIAGNOSIS — G4733 Obstructive sleep apnea (adult) (pediatric): Secondary | ICD-10-CM

## 2017-01-05 DIAGNOSIS — F32A Depression, unspecified: Secondary | ICD-10-CM

## 2017-01-05 DIAGNOSIS — Z9989 Dependence on other enabling machines and devices: Secondary | ICD-10-CM | POA: Diagnosis not present

## 2017-01-05 DIAGNOSIS — I10 Essential (primary) hypertension: Secondary | ICD-10-CM

## 2017-01-05 LAB — POCT URINALYSIS DIP (DEVICE)
BILIRUBIN URINE: NEGATIVE
Glucose, UA: NEGATIVE mg/dL
Hgb urine dipstick: NEGATIVE
Ketones, ur: NEGATIVE mg/dL
LEUKOCYTES UA: NEGATIVE
NITRITE: NEGATIVE
Protein, ur: NEGATIVE mg/dL
Specific Gravity, Urine: <=1.005 (ref 1.005–1.030)
UROBILINOGEN UA: 0.2 mg/dL (ref 0.0–1.0)
pH: 5.5 (ref 5.0–8.0)

## 2017-01-05 LAB — TSH: TSH: 1.81 mIU/L (ref 0.40–4.50)

## 2017-01-05 MED ORDER — CITALOPRAM HYDROBROMIDE 20 MG PO TABS: 20.0000 mg | ORAL_TABLET | Freq: Every day | ORAL | 1 refills | Status: DC

## 2017-01-05 MED ORDER — METOPROLOL SUCCINATE ER 50 MG PO TB24: 50.0000 mg | ORAL_TABLET | Freq: Every day | ORAL | 1 refills | Status: DC

## 2017-01-05 NOTE — Progress Notes (Signed)
Subjective:    Patient ID: [name], male    DOB: Nov 07, 1970, 46 y.o.   MRN: [id number]  HPI  [name], a very pleasant 46 year old male presents to establish care. [name] has a history of essential hypertension, hyperlipidemia, depression and GERD.   Patient complains of periodic chest pain. [name] was recently riding motorcycle on a road trip and experienced chest pain as he was driving down a mountain. He says that chest pressure has been occurring periodically.  The patient describes the pain as intermittent and uncomfortable. He was a patient of Dr. Daneen Schick, cardiologist, but has been lost to follow up over the past year. 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 He maintains that he was treated with colchicine with maximum relief.  Patient rates pain as a 2/10 in intensity.  He has not identified palliative or provacative factors. Patient's cardiac risk factors are family history of premature cardiovascular disease.    Patient also has a history of hypertension. Blood pressure has been controlled on Toprol XL 50 mg.  [name] exercises routinely and follows a ketogenic diet.  Blood pressure is well controlled at home. Patient denies chest pain, fatigue, near-syncope, orthopnea, palpitations and syncope.    Patient has a history of depression. He complains of periodic anhedonia. Onset was approximately several years ago. Symptoms have been controlled on  He denies current suicidal and homicidal plan or intent.   Social History   Social History  . Marital status: Married    Spouse name: N/A  . Number of children: 0  . Years of education: N/A   Occupational History  . Not on file.   Social History Main Topics  . Smoking status: Former Smoker    Packs/day: 3.00    Years: 10.00    Types: Cigarettes, Cigars    Quit date: 08/13/2005  . Smokeless tobacco: Former Systems developer  . Alcohol use No  . Drug use: No  . Sexual activity: Not on file   Other  Topics Concern  . Not on file   Social History Narrative  . No narrative on file   indicated that his mother is alive. He indicated that his father is deceased. He indicated that the status of his brother is unknown. He indicated that the status of his maternal grandmother is unknown. He indicated that the status of his maternal grandfather is unknown. He indicated that the status of his paternal grandmother is unknown. He indicated that the status of his paternal grandfather is unknown.   Allergies  Allergen Reactions  . Ansaid [Ocufen] Nausea And Vomiting  . Chantix [Varenicline Tartrate] Other (See Comments)    Gi intolerances   . Codeine Other (See Comments)    Gi intolerance   . Erythromycin Other (See Comments)    Gi intolerances     Immunization History  Administered Date(s) Administered  . Influenza Split 03/22/2014  . Influenza,inj,Quad PF,36+ Mos 02/03/2015  . Tdap 03/26/2014   Review of Systems  Constitutional: Positive for fatigue.  HENT: Negative.  Negative for sinus pain.   Eyes: Positive for photophobia. Negative for redness and visual disturbance.  Respiratory: Negative.   Cardiovascular: Negative for palpitations and leg swelling.  Gastrointestinal: Negative.   Endocrine: Negative for cold intolerance, heat intolerance, polydipsia, polyphagia and polyuria.  Genitourinary: Negative.   Musculoskeletal: Negative.   Skin: Negative.   Allergic/Immunologic: Negative for immunocompromised state.  Neurological: Positive for tremors and headaches (migraine headache). Negative for dizziness,  seizures, syncope, speech difficulty, light-headedness and numbness.      Objective:   Physical Exam  Constitutional: He is oriented to person, place, and time. He appears well-developed and well-nourished.  HENT:  Head: Normocephalic and atraumatic.  Right Ear: External ear normal.  Left Ear: External ear normal.  Nose: Nose normal.  Mouth/Throat: Oropharynx is clear and  moist.  Eyes: Pupils are equal, round, and reactive to light. Conjunctivae and EOM are normal.  Neck: Normal range of motion. Neck supple.  Cardiovascular: Normal rate, regular rhythm, normal heart sounds and intact distal pulses.   Pulmonary/Chest: Effort normal and breath sounds normal.  Abdominal: Soft. Bowel sounds are normal. He exhibits no distension. There is no tenderness. There is no rebound and no guarding.  Musculoskeletal: Normal range of motion.  Neurological: He is alert and oriented to person, place, and time. He has normal reflexes.      BP 122/82 (BP Location: Left Arm, Patient Position: Sitting, Cuff Size: Normal)   Pulse 62   Temp 97.9 F (36.6 C) (Oral)   Resp 16   Ht 5' 9"  (1.753 m)   Wt 186 lb (84.4 kg)   SpO2 97%   BMI 27.47 kg/m  Assessment & Plan:   1. Chest pain in adult Reviewed EKG, normal sinus rhythm.  Patient does not warrant a chest xray at this time Sent referral to Dr. Daneen Schick to reestablish care for further work up and evaluation - EKG 12-Lead - CBC with Differential - TSH  2. Essential hypertension Blood pressure is at goal on current medication regimen. No medication changes warranted.  - aspirin 325 MG EC tablet; Take 325 mg by mouth daily. - COMPLETE METABOLIC PANEL WITH GFR - metoprolol succinate (TOPROL-XL) 50 MG 24 hr tablet; Take 1 tablet (50 mg total) by mouth daily.  Dispense: 90 tablet; Refill: 1 - POCT urinalysis dip (device)  3. Hyperlipidemia, unspecified hyperlipidemia type The patient is asked to make an attempt to improve diet and exercise patterns to aid in medical management of this problem.  - aspirin 325 MG EC tablet; Take 325 mg by mouth daily. - Lipid Panel; Future  4. Depression, unspecified depression type Depression screen [facility] 2/9 01/05/2017  Decreased Interest 0  Down, Depressed, Hopeless 1  PHQ - 2 Score 1    - citalopram (CELEXA) 20 MG tablet; Take 1 tablet (20 mg total) by mouth daily. Reported on  10/12/2015  Dispense: 90 tablet; Refill: 1  5. OSA on CPAP Continue CPAP at bedtime.   6. Gastroesophageal reflux disease without esophagitis Symptoms controlled on  Omeprazole 40  7. Myocardial bridge - Ambulatory referral to Cardiology  8. History of migraine headaches - SUMAtriptan (IMITREX) 25 MG tablet; Take 25 mg by mouth every 2 (two) hours as needed for migraine. May repeat in 2 hours if headache persists or recurs. - aspirin-acetaminophen-caffeine (EXCEDRIN MIGRAINE) 250-250-65 MG tablet; Take by mouth every 6 (six) hours as needed for headache.   Return in 1 week for blood pressure check and fasting lipid panel   RTC: 6 months for chronic conditions   [place]  MSN, FNP-C [place] [address] [place], [place] [address] [phone]

## 2017-01-05 NOTE — Patient Instructions (Signed)
Thank you for establishing care with the Patient [name].   Hypertension: Will continue Metoprolol as previous prescribed. Will send a referral to cardiology with Dr. Tamala Julian for further work up and evaluation of chest pain.   Reviewed EKG, sinus bradycardia.    Recommend a lowfat, low carbohydrate diet divided over 5-6 small meals, increase water intake to 6-8 glasses, and 150 minutes per week of cardiovascular exercise.    Will follow up by phone with any abnormal laboratory results.   Laboratory values will be available for review on My Chart. Thanks for signing up for MyChart!    Bradycardia, Adult Bradycardia is a slower-than-normal heartbeat. A normal resting heart rate for an adult ranges from 60 to 100 beats per minute. With bradycardia, the resting heart rate is less than 60 beats per minute. Bradycardia can prevent enough oxygen from reaching certain areas of your body when you are active. It can be serious if it keeps enough oxygen from reaching your brain and other parts of your body. Bradycardia is not a problem for everyone. For some healthy adults, a slow resting heart rate is normal. What are the causes? This condition may be caused by:  A problem with the heart, including: ? A problem with the heart's electrical system, such as a heart block. ? A problem with the heart's natural pacemaker (sinus node). ? Heart disease. ? A heart attack. ? Heart damage. ? A heart infection. ? A heart condition that is present at birth (congenital heart defect).  Certain medicines that treat heart conditions.  Certain conditions, such as hypothyroidism and obstructive sleep apnea.  Problems with the balance of chemicals and other substances, like potassium, in the blood.  What increases the risk? This condition is more likely to develop in adults who:  Are age 49 or older.  Have high blood pressure (hypertension), high cholesterol (hyperlipidemia), or diabetes.  Drink  heavily, use tobacco or nicotine products, or use drugs.  Are stressed.  What are the signs or symptoms? Symptoms of this condition include:  Light-headedness.  Feeling faint or fainting.  Fatigue and weakness.  Shortness of breath.  Chest pain (angina).  Drowsiness.  Confusion.  Dizziness.  How is this diagnosed? This condition may be diagnosed based on:  Your symptoms.  Your medical history.  A physical exam.  During the exam, your health care provider will listen to your heartbeat and check your pulse. To confirm the diagnosis, your health care provider may order tests, such as:  Blood tests.  An electrocardiogram (ECG). This test records the heart's electrical activity. The test can show how fast your heart is beating and whether the heartbeat is steady.  A test in which you wear a portable device (event recorder or Holter monitor) to record your heart's electrical activity while you go about your day.  Anexercise test.  How is this treated? Treatment for this condition depends on the cause of the condition and how severe your symptoms are. Treatment may involve:  Treatment of the underlying condition.  Changing your medicines or how much medicine you take.  Having a small, battery-operated device called a pacemaker implanted under the skin. When bradycardia occurs, this device can be used to increase your heart rate and help your heart to beat in a regular rhythm.  Follow these instructions at home: Lifestyle   Manage any health conditions that contribute to bradycardia as told by your health care provider.  Follow a heart-healthy diet. A nutrition specialist (dietitian) can  help to educate you about healthy food options and changes.  Follow an exercise program that is approved by your health care provider.  Maintain a healthy weight.  Try to reduce or manage your stress, such as with yoga or meditation. If you need help reducing stress, ask your  health care provider.  Do not use use any products that contain nicotine or tobacco, such as cigarettes and e-cigarettes. If you need help quitting, ask your health care provider.  Do not use illegal drugs.  Limit alcohol intake to no more than 1 drink per day for nonpregnant women and 2 drinks per day for men. One drink equals 12 oz of beer, 5 oz of wine, or 1 oz of hard liquor. General instructions  Take over-the-counter and prescription medicines only as told by your health care provider.  Keep all follow-up visits as directed by your health care provider. This is important. How is this prevented? In some cases, bradycardia may be prevented by:  Treating underlying medical problems.  Stopping behaviors or medicines that can trigger the condition.  Contact a health care provider if:  You feel light-headed or dizzy.  You almost faint.  You feel weak or are easily fatigued during physical activity.  You experience confusion or have memory problems. Get help right away if:  You faint.  You have an irregular heartbeat (palpitations).  You have chest pain.  You have trouble breathing. This information is not intended to replace advice given to you by your health care provider. Make sure you discuss any questions you have with your health care provider. Document Released: 02/18/2002 Document Revised: 01/25/2016 Document Reviewed: 11/18/2015 Elsevier Interactive Patient Education  2017 [employer].

## 2017-01-06 LAB — COMPLETE METABOLIC PANEL WITH GFR
ALT: 17 U/L (ref 9–46)
AST: 17 U/L (ref 10–40)
Albumin: 4.9 g/dL (ref 3.6–5.1)
Alkaline Phosphatase: 59 U/L (ref 40–115)
BUN: 19 mg/dL (ref 7–25)
CO2: 21 mmol/L (ref 20–31)
CREATININE: 1.32 mg/dL (ref 0.60–1.35)
Calcium: 10 mg/dL (ref 8.6–10.3)
Chloride: 98 mmol/L (ref 98–110)
GFR, Est African American: 75 mL/min (ref >=60)
GFR, Est Non African American: 65 mL/min (ref >=60)
GLUCOSE: 82 mg/dL (ref 65–99)
Potassium: 3.9 mmol/L (ref 3.5–5.3)
SODIUM: 137 mmol/L (ref 135–146)
Total Bilirubin: 0.8 mg/dL (ref 0.2–1.2)
Total Protein: 7.3 g/dL (ref 6.1–8.1)

## 2017-01-06 LAB — CBC WITH DIFFERENTIAL/PLATELET
BASOS PCT: 1 %
Basophils Absolute: 58 cells/uL (ref 0–200)
EOS ABS: 116 {cells}/uL (ref 15–500)
Eosinophils Relative: 2 %
HEMATOCRIT: 38.9 % (ref 38.5–50.0)
Hemoglobin: 13.4 g/dL (ref 13.2–17.1)
Lymphocytes Relative: 35 %
Lymphs Abs: 2030 cells/uL (ref 850–3900)
MCH: 30.5 pg (ref 27.0–33.0)
MCHC: 34.4 g/dL (ref 32.0–36.0)
MCV: 88.4 fL (ref 80.0–100.0)
MONO ABS: 406 {cells}/uL (ref 200–950)
MPV: 9.7 fL (ref 7.5–12.5)
Monocytes Relative: 7 %
NEUTROS PCT: 55 %
Neutro Abs: 3190 cells/uL (ref 1500–7800)
Platelets: 220 10*3/uL (ref 140–400)
RBC: 4.4 MIL/uL (ref 4.20–5.80)
RDW: 15.1 % — ABNORMAL HIGH (ref 11.0–15.0)
WBC: 5.8 10*3/uL (ref 3.8–10.8)

## 2017-01-08 ENCOUNTER — Telehealth: Payer: Self-pay

## 2017-01-08 NOTE — Telephone Encounter (Signed)
-----   Message from Dorena Dew, [place] sent at 01/07/2017  4:07 PM EDT ----- Regarding: lab results Please inform [name] that all labs are within a normal range. Remind him that I sent a referral to cardiology for evaluation. Labs are available on MyChart. Fasting labs on Friday, otherwise will follow up in 6 months or as needed.   Thanks ----- Message ----- From: Interface, Lab In Three Zero Five Sent: 01/06/2017  12:08 AM To: Dorena Dew, FNP

## 2017-01-08 NOTE — Telephone Encounter (Signed)
Called and spoke with patient, advised of lab being within normal levels, that we are referring to cardiology, to come in for his appointment on Friday for fasting labs, and that we will follow up with him in 6 months. Patient verbalized understanding. Thanks!

## 2017-01-10 MED ORDER — OMEPRAZOLE 40 MG PO CPDR: 40.0000 mg | DELAYED_RELEASE_CAPSULE | Freq: Every day | ORAL | 1 refills | Status: DC

## 2017-01-11 NOTE — Progress Notes (Signed)
Cardiology Office Note   Date:  01/12/2017   ID:  [name], DOB [birth year], MRN [id number]  PCP:  Dorena Dew, FNP  Cardiologist:  Dr. Tamala Julian last seen 04/17/14    Chief Complaint  Patient presents with  . Chest Pain      History of Present Illness: [name] is a 46 y.o. male , Interior and spatial designer of oncology at [employer], who presents for recurrent chest pain.  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 Other hx of HLD, depression and GERD.   Also with OSA on CPAP.   Recent labs WNL including TSH.  Lipids in process.  Today he is here for episode of chest pain he had riding his motorcycle from here to [place] and back. He had midsternal chest pain , a tearing sensation lasting 1-2.5 hours, mild SOB with it.  No nausea and it was 90 degrees and he could not tell if he was diaphoretic.   Since that time he has his usual twinges off and on.  He saw his PCP and referred here for eval.  Otherwise he has been doing well with no need for Imdur or statin, lipids have been doing well.   He has been on asa 325 mg but will decrease to 81 mg daily.  He takes Excedrin migraine as well so 81 mg will be enough.  He is active.  No palpitations.  No lightheadedness or dizziness. No syncope.     Past Medical History:  Diagnosis Date  . Asthma   . Coronary artery disease CARDIOLOGIST - DR Daneen Schick--- LAST VISIT OCT 2012   DENIES S & S  . Depression   . Dysuria   . GERD (gastroesophageal reflux disease)   . H/O hiatal hernia   . History of angina pectoris   . Hypertension   . Kidney calculi   . Left ureteral calculus   . Myocardial bridge MID LAD  . Shortness of breath    with excertion    Past Surgical History:  Procedure Laterality Date  . CARDIAC CATHETERIZATION  01-07-2010-- DR Daneen Schick   NORMAL LVF/ PATENT CORONARY ARTERIES/ MYOCARDIAL BRIDGE MID LAD  . CYSTOSCOPY W/  URETERAL STENT PLACEMENT  08/15/2011   Procedure: CYSTOSCOPY WITH STENT REPLACEMENT;  Surgeon: Fredricka Bonine, MD;  [facility];  Service: Urology;  Laterality: Left;  stone basket extraction left, left ureteroscopy  . EXTRACORPOREAL SHOCK WAVE LITHOTRIPSY  08-07-11   LEFT  . VERICOCELECTOMY  1992     Current Outpatient Prescriptions  Medication Sig Dispense Refill  . aspirin 325 MG EC tablet Take 325 mg by mouth daily.    Marland Kitchen aspirin-acetaminophen-caffeine (EXCEDRIN MIGRAINE) 250-250-65 MG tablet Take by mouth every 6 (six) hours as needed for headache.    . citalopram (CELEXA) 20 MG tablet Take 1 tablet (20 mg total) by mouth daily. Reported on 10/12/2015 90 tablet 1  . glucosamine-chondroitin 500-400 MG tablet Take 1 tablet by mouth 3 (three) times daily.    Marland Kitchen ibuprofen (ADVIL,MOTRIN) 800 MG tablet Take 1 tablet (800 mg total) by mouth 3 (three) times daily. 21 tablet 0  . metoprolol succinate (TOPROL-XL) 50 MG 24 hr tablet Take 1 tablet (50 mg total) by mouth daily. 90 tablet 1  . Multiple Vitamin (MULTIVITAMIN) tablet Take 1 tablet by mouth daily.    Marland Kitchen omeprazole (PRILOSEC) 40  MG capsule Take 1 capsule (40 mg total) by mouth daily. 90 capsule 1  . SUMAtriptan (IMITREX) 25 MG tablet Take 25 mg by mouth every 2 (two) hours as needed for migraine. May repeat in 2 hours if headache persists or recurs.     No current facility-administered medications for this visit.     Allergies:   Ansaid [ocufen]; Chantix [varenicline tartrate]; Codeine; and Erythromycin    Social History:  The patient  reports that he quit smoking about 11 years ago. His smoking use included Cigarettes and Cigars. He has a 30.00 pack-year smoking history. He has quit using smokeless tobacco. He reports that he does not drink alcohol or use drugs.   Family History:  The patient's family history includes Alzheimer's disease in his maternal grandmother; Aortic aneurysm in his paternal  grandfather; Cancer - Lung in his father; Emphysema in his paternal grandfather; Heart attack in his brother; Hypertension in his mother and paternal grandmother; Stroke in his maternal grandfather.    ROS:  General:no colds or fevers, no weight changes Skin:no rashes or ulcers HEENT:no blurred vision, no congestion CV:see HPI PUL:see HPI GI:no diarrhea constipation or melena, no indigestion GU:no hematuria, no dysuria MS:no joint pain, no claudication Neuro:no syncope, no lightheadedness Endo:no diabetes, no thyroid disease  Wt Readings from Last 3 Encounters:  01/12/17 181 lb (82.1 kg)  01/05/17 186 lb (84.4 kg)  01/06/16 189 lb (85.7 kg)     PHYSICAL EXAM: VS:  BP 130/72   Pulse (!) 54   Ht 5' 9"  (1.753 m)   Wt 181 lb (82.1 kg)   SpO2 99%   BMI 26.73 kg/m  , BMI Body mass index is 26.73 kg/m. General:Pleasant affect, NAD Skin:Warm and dry, brisk capillary refill HEENT:normocephalic, sclera clear, mucus membranes moist Neck:supple, no JVD, no bruits  Heart:S1S2 RRR without murmur, gallup, rub or click Lungs:clear without rales, rhonchi, or wheezes DTO:IZTI, non tender, + BS, do not palpate liver spleen or masses Ext:no lower ext edema, 2+ pedal pulses, 2+ radial pulses Neuro:alert and oriented, MAE, follows commands, + facial symmetry    EKG:  EKG is ordered today. The ekg ordered today demonstrates SB at 54, normal EKG except for bradycardia.     Recent Labs: 01/05/2017: ALT 17; BUN 19; Creat 1.32; Hemoglobin 13.4; Platelets 220; Potassium 3.9; Sodium 137; TSH 1.81    Lipid Panel    Component Value Date/Time   CHOL 148 08/01/2013 0752   TRIG 69.0 08/01/2013 0752   HDL 51.50 08/01/2013 0752   CHOLHDL 3 08/01/2013 0752   VLDL 13.8 08/01/2013 0752   LDLCALC 83 08/01/2013 0752       Other studies Reviewed: Additional studies/ records that were reviewed today include: reviewed previous cath in 2011.     ASSESSMENT AND PLAN:  1.  Chest pain with hx of  cardiac bridging on cath 2011.  Discussed with Dr. Johnsie Cancel and will do cardiac CT with Ca+ score, morphology and FFR to eval flow restriction as cause of pain.  Dr. Johnsie Cancel to read.  If abnormal will discuss with Dr. Tamala Julian.  Otherwise he will follow with Dr. Tamala Julian in Oct.  2.  Myocardial bridging.  See above.  3.  Hx pericarditis.  No current complaints and no longer takes colchicine.   4.  Lipids pending.   5. OSA on CPAP   Current medicines are reviewed with the patient today.  The patient Has no concerns regarding medicines.  The following changes have been made:  See above Labs/ tests ordered today include:see above  Disposition:   FU:  see above  Signed, Cecilie Kicks, NP  01/12/2017 10:35 AM    [place], [place], [place], [place] Phone: [phone]; Fax: [phone]

## 2017-01-12 ENCOUNTER — Encounter: Payer: Self-pay | Admitting: Cardiology

## 2017-01-12 ENCOUNTER — Ambulatory Visit (INDEPENDENT_AMBULATORY_CARE_PROVIDER_SITE_OTHER): Payer: 59 | Admitting: Cardiology

## 2017-01-12 ENCOUNTER — Ambulatory Visit (INDEPENDENT_AMBULATORY_CARE_PROVIDER_SITE_OTHER): Payer: 59

## 2017-01-12 VITALS — BP 130/72 | HR 54 | Ht 69.0 in | Wt 181.0 lb

## 2017-01-12 DIAGNOSIS — R079 Chest pain, unspecified: Secondary | ICD-10-CM | POA: Diagnosis not present

## 2017-01-12 DIAGNOSIS — G4733 Obstructive sleep apnea (adult) (pediatric): Secondary | ICD-10-CM | POA: Diagnosis not present

## 2017-01-12 DIAGNOSIS — Q245 Malformation of coronary vessels: Secondary | ICD-10-CM | POA: Diagnosis not present

## 2017-01-12 DIAGNOSIS — Z8679 Personal history of other diseases of the circulatory system: Secondary | ICD-10-CM

## 2017-01-12 DIAGNOSIS — E785 Hyperlipidemia, unspecified: Secondary | ICD-10-CM

## 2017-01-12 DIAGNOSIS — Z9989 Dependence on other enabling machines and devices: Secondary | ICD-10-CM | POA: Diagnosis not present

## 2017-01-12 NOTE — Patient Instructions (Signed)
Medication Instructions:  Your physician has recommended you make the following change in your medication:  1. Decrease Asprin (81 ) mg daily.     Labwork: -None-  Testing/Procedures: Your physician has requested that you have cardiac CT. Cardiac computed tomography (CT) is a painless test that uses an x-ray machine to take clear, detailed pictures of your heart. For further information please visit [email or web address]. Please follow instruction sheet as given.      Follow-Up: Your physician recommends that you keep your scheduled  follow-up appointment with Dr. Tamala Julian.    Any Other Special Instructions Will Be Listed Below (If Applicable).     If you need a refill on your cardiac medications before your next appointment, please call your pharmacy.

## 2017-01-13 LAB — LIPID PANEL
CHOL/HDL RATIO: 5.3 ratio — AB (ref <5.0)
CHOLESTEROL: 218 mg/dL — AB (ref <200)
HDL: 41 mg/dL (ref >40)
LDL CALC: 143 mg/dL — AB (ref <100)
TRIGLYCERIDES: 170 mg/dL — AB (ref <150)
VLDL: 34 mg/dL — AB (ref <30)

## 2017-01-15 ENCOUNTER — Other Ambulatory Visit: Payer: Self-pay | Admitting: Family Medicine

## 2017-01-22 MED FILL — OMEPRAZOLE DR 40 MG CAPSULE: 40 | 90 days supply | Qty: 90 | Fill #0

## 2017-01-25 ENCOUNTER — Encounter: Payer: Self-pay | Admitting: Interventional Cardiology

## 2017-01-26 DIAGNOSIS — G4733 Obstructive sleep apnea (adult) (pediatric): Secondary | ICD-10-CM | POA: Diagnosis not present

## 2017-02-05 ENCOUNTER — Encounter: Payer: Self-pay | Admitting: Interventional Cardiology

## 2017-02-13 MED FILL — METOPROLOL SUCC ER 50 MG TA: 50 | 90 days supply | Qty: 90 | Fill #0

## 2017-02-13 MED FILL — CITALOPRAM HBR 20 MG TABLET: 20 | 90 days supply | Qty: 90 | Fill #0

## 2017-02-14 ENCOUNTER — Ambulatory Visit (HOSPITAL_COMMUNITY): Payer: 59

## 2017-02-14 ENCOUNTER — Ambulatory Visit (HOSPITAL_COMMUNITY)
Admission: RE | Admit: 2017-02-14 | Discharge: 2017-02-14 | Disposition: A | Discharge status: Home / Self Care | Payer: 59 | Source: Ambulatory Visit | Attending: Cardiology | Admitting: Cardiology

## 2017-02-14 ENCOUNTER — Encounter: Payer: Self-pay | Admitting: Interventional Cardiology

## 2017-02-14 DIAGNOSIS — R079 Chest pain, unspecified: Secondary | ICD-10-CM

## 2017-02-14 MED ORDER — IOPAMIDOL (ISOVUE-370) INJECTION 76%
INTRAVENOUS | Status: AC
  Administered 2017-02-14: 80 mL via INTRAVENOUS
  Filled 2017-02-14: qty 100

## 2017-02-14 MED ORDER — NITROGLYCERIN 0.4 MG SL SUBL
0.4000 mg | SUBLINGUAL_TABLET | Freq: Once | SUBLINGUAL | Status: AC
  Administered 2017-02-14: 0.4 mg via SUBLINGUAL

## 2017-02-14 MED ORDER — NITROGLYCERIN 0.4 MG SL SUBL
SUBLINGUAL_TABLET | SUBLINGUAL | Status: AC
  Filled 2017-02-14: qty 1

## 2017-02-14 NOTE — Progress Notes (Signed)
CT scan completed. Tolerated well. D/C home walking, awake and alert. In no distress.

## 2017-02-15 ENCOUNTER — Telehealth: Payer: Self-pay | Admitting: *Deleted

## 2017-02-15 DIAGNOSIS — E785 Hyperlipidemia, unspecified: Secondary | ICD-10-CM

## 2017-02-15 MED ORDER — ROSUVASTATIN CALCIUM 20 MG PO TABS: 20.0000 mg | ORAL_TABLET | Freq: Every day | ORAL | 3 refills | Status: DC

## 2017-02-15 MED FILL — ROSUVASTATIN CALCIUM 20 MG: 20 | 90 days supply | Qty: 90 | Fill #0

## 2017-02-15 NOTE — Telephone Encounter (Signed)
-----   Message from Belva Crome, MD sent at 02/15/2017  3:35 PM EDT ----- Regarding: Asymptomatic coronary disease by coronary CTA 02/14/17 I think we need to start Jonelle Sidle on statin therapy. Recommend rosuvastatin 20 mg per day. Liver and lipid panel in 8 weeks. LDL target less than 70.  This is my message to the patient in my chart:  "I did not know the CT had been performed. I reviewed the results. You have asymptomatic atherosclerotic disease. Management at this point should include lipid-lowering with a statin to LDL less than 70. Also an aspirin 81 mg per day should be substituted for 325 mg aspirin to avoid bleeding complications. In absence of symptoms, long-acting nitrates will not be helpful. Statin therapy will help to prevent progression of plaque."

## 2017-02-15 NOTE — Telephone Encounter (Signed)
Spoke with pt and went over CT results and recommendations per Dr. Tamala Julian.  Pt will come for labs on 11/6.  Pt verbalized understanding and was in agreement with this plan.

## 2017-03-07 ENCOUNTER — Encounter: Payer: Self-pay | Admitting: Interventional Cardiology

## 2017-03-16 ENCOUNTER — Encounter: Payer: Self-pay | Admitting: Interventional Cardiology

## 2017-03-16 ENCOUNTER — Ambulatory Visit (INDEPENDENT_AMBULATORY_CARE_PROVIDER_SITE_OTHER): Payer: 59 | Admitting: Interventional Cardiology

## 2017-03-16 VITALS — BP 112/76 | HR 75 | Ht 69.0 in | Wt 181.0 lb

## 2017-03-16 DIAGNOSIS — I208 Other forms of angina pectoris: Secondary | ICD-10-CM

## 2017-03-16 DIAGNOSIS — E7849 Other hyperlipidemia: Secondary | ICD-10-CM

## 2017-03-16 DIAGNOSIS — I2510 Atherosclerotic heart disease of native coronary artery without angina pectoris: Secondary | ICD-10-CM

## 2017-03-16 DIAGNOSIS — G4733 Obstructive sleep apnea (adult) (pediatric): Secondary | ICD-10-CM | POA: Diagnosis not present

## 2017-03-16 DIAGNOSIS — Z9989 Dependence on other enabling machines and devices: Secondary | ICD-10-CM

## 2017-03-16 DIAGNOSIS — I10 Essential (primary) hypertension: Secondary | ICD-10-CM | POA: Diagnosis not present

## 2017-03-16 MED ORDER — NITROGLYCERIN 0.4 MG/SPRAY TL SOLN: 1.0000 | 3 refills | Status: DC | PRN

## 2017-03-16 MED ORDER — ISOSORBIDE MONONITRATE ER 30 MG PO TB24: 30.0000 mg | ORAL_TABLET | Freq: Every day | ORAL | 3 refills | Status: DC

## 2017-03-16 MED FILL — NITROGLYCERIN LINGUAL 0.4 M: 0.4 | 30 days supply | Qty: 12 | Fill #0

## 2017-03-16 MED FILL — ISOSORBIDE MN ER 30 MG TAB: 30 | 90 days supply | Qty: 90 | Fill #0

## 2017-03-16 NOTE — Progress Notes (Signed)
Cardiology Office Note    Date:  03/16/2017   ID:  [name], DOB [birth year], MRN [id number]  PCP:  Joshua Dew, FNP  Cardiologist: Joshua Grooms, MD   Chief Complaint  Patient presents with  . Coronary Artery Disease    History of Present Illness:  [name] is a 46 y.o. 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 Other hx of HLD, depression and GERD.   Also with OSA on CPAP. Recent cardiac CT with morphology an FFR did not reveal obstruction but coronary calcification was noted (see below).   Doing well. Physically active. Not limited most of the time. Short bursts of activity that are strenuous, especially climbing stairs in the mornings at work causes tightness in the chest. The recent CT angina not demonstrating evidence of obstructive epicardial disease. Mild calcification was noted. In the past we have dealt with exertional angina. I believe he has decreased coronary vasodilatory reserve/microvascular disease/syndrome X.   Past Medical History:  Diagnosis Date  . Asthma   . Coronary artery disease CARDIOLOGIST - DR [name]--- LAST VISIT OCT 2012   DENIES S & S  . Depression   . Dysuria   . GERD (gastroesophageal reflux disease)   . H/O hiatal hernia   . History of angina pectoris   . Hypertension   . Kidney calculi   . Left ureteral calculus   . Myocardial bridge MID LAD  . Shortness of breath    with excertion    Past Surgical History:  Procedure Laterality Date  . CARDIAC CATHETERIZATION  01-07-2010-- DR [name]   NORMAL LVF/ PATENT CORONARY ARTERIES/ MYOCARDIAL BRIDGE MID LAD  . CYSTOSCOPY W/ URETERAL STENT PLACEMENT  08/15/2011   Procedure: CYSTOSCOPY WITH STENT REPLACEMENT;  Surgeon: Joshua Bonine, MD;  Location: [facility];   Service: Urology;  Laterality: Left;  stone basket extraction left, left ureteroscopy  . EXTRACORPOREAL SHOCK WAVE LITHOTRIPSY  08-07-11   LEFT  . VERICOCELECTOMY  1992    Current Medications: Outpatient Medications Prior to Visit  Medication Sig Dispense Refill  . aspirin-acetaminophen-caffeine (EXCEDRIN MIGRAINE) 250-250-65 MG tablet Take by mouth every 6 (six) hours as needed for headache.    . citalopram (CELEXA) 20 MG tablet Take 1 tablet (20 mg total) by mouth daily. Reported on 10/12/2015 90 tablet 1  . glucosamine-chondroitin 500-400 MG tablet Take 1 tablet by mouth 3 (three) times daily.    Marland Kitchen ibuprofen (ADVIL,MOTRIN) 800 MG tablet Take 1 tablet (800 mg total) by mouth 3 (three) times daily. 21 tablet 0  . metoprolol succinate (TOPROL-XL) 50 MG 24 hr tablet Take 1 tablet (50 mg total) by mouth daily. 90 tablet 1  . Multiple Vitamin (MULTIVITAMIN) tablet Take 1 tablet by mouth daily.    Marland Kitchen omeprazole (PRILOSEC) 40 MG capsule Take 1 capsule (40 mg total) by mouth daily. 90 capsule 1  . rosuvastatin (CRESTOR) 20 MG tablet Take 1 tablet (20 mg total) by mouth daily. 90 tablet 3  . SUMAtriptan (IMITREX) 25 MG tablet Take 25 mg by mouth every 2 (two) hours as needed for migraine. May repeat in 2 hours if headache persists or recurs.    Marland Kitchen aspirin 325 MG EC tablet Take 81 mg by mouth daily.  No facility-administered medications prior to visit.      Allergies:   Ansaid [ocufen]; Chantix [varenicline tartrate]; Codeine; and Erythromycin   Social History   Social History  . Marital status: Married    Spouse name: N/A  . Number of children: 0  . Years of education: N/A   Social History Main Topics  . Smoking status: Former Smoker    Packs/day: 3.00    Years: 10.00    Types: Cigarettes, Cigars    Quit date: 08/13/2005  . Smokeless tobacco: Former Systems developer  . Alcohol use No  . Drug use: No  . Sexual activity: Not Asked   Other Topics Concern  . None   Social History Narrative  .  None     Family History:  The patient's family history includes Alzheimer's disease in his maternal grandmother; Aortic aneurysm in his paternal grandfather; Cancer - Lung in his father; Emphysema in his paternal grandfather; Heart attack in his brother; Hypertension in his mother and paternal grandmother; Stroke in his maternal grandfather.   ROS:   Please see the history of present illness.    Exertional discomfort, occasional shortness of breath, cough, muscle pain, headaches.  All other systems reviewed and are negative.   PHYSICAL EXAM:   VS:  BP 112/76 (BP Location: Right Arm)   Pulse 75   Ht 5' 9"  (1.753 m)   Wt 181 lb (82.1 kg)   BMI 26.73 kg/m    GEN: Well nourished, well developed, in no acute distress  HEENT: normal  Neck: no JVD, carotid bruits, or masses Cardiac: RRR; no murmurs, rubs, or gallops,no edema  Respiratory:  clear to auscultation bilaterally, normal work of breathing GI: soft, nontender, nondistended, + BS MS: no deformity or atrophy  Skin: warm and dry, no rash Neuro:  Alert and Oriented x 3, Strength and sensation are intact Psych: euthymic mood, full affect  Wt Readings from Last 3 Encounters:  03/16/17 181 lb (82.1 kg)  01/12/17 181 lb (82.1 kg)  01/05/17 186 lb (84.4 kg)      Studies/Labs Reviewed:   EKG:  EKG  Not repeated.  Recent Labs: 01/05/2017: ALT 17; BUN 19; Creat 1.32; Hemoglobin 13.4; Platelets 220; Potassium 3.9; Sodium 137; TSH 1.81   Lipid Panel    Component Value Date/Time   CHOL 218 (H) 01/12/2017 0819   TRIG 170 (H) 01/12/2017 0819   HDL 41 01/12/2017 0819   CHOLHDL 5.3 (H) 01/12/2017 0819   VLDL 34 (H) 01/12/2017 0819   LDLCALC 143 (H) 01/12/2017 0272    Additional studies/ records that were reviewed today include:  None    ASSESSMENT:    1. Syndrome X (cardiac) ([place])   2. Essential hypertension   3. Other hyperlipidemia   4. Coronary artery calcification seen on CAT scan   5. OSA on CPAP      PLAN:    In order of problems listed above:  1. Imdur 30 mg per day. Nitroglycerin spray when necessary for chest discomfort not relieved by rest. Follow-up in 6 months. 2. Current excellent control. 3. LDL target less than 70 should be October.. Crestor is been started. Liver and lipid panel will be done in approximately 2 months. 4. Aggressive risk factor modification: LDL less than 70, blood pressure less than 130/85, weight control, and aerobic activity.  Six-month follow-up. Call if clinical worsening. Medication Adjustments/Labs and Tests Ordered: Current medicines are reviewed at length with the patient today.  Concerns regarding medicines are outlined above.  Medication changes, Labs and Tests ordered today are listed in the Patient Instructions below. Patient Instructions  Medication Instructions:  1) Use Nitro Spray as instructed. 2) START Imdur 79m once daily  Labwork: None  Testing/Procedures: None  Follow-Up: Your physician wants you to follow-up in: 6 months with Dr. STamala Julian  You will receive a reminder letter in the mail two months in advance. If you don't receive a letter, please call our office to schedule the follow-up appointment.   Any Other Special Instructions Will Be Listed Below (If Applicable).     If you need a refill on your cardiac medications before your next appointment, please call your pharmacy.      Signed, HSinclair Grooms MD  03/16/2017 9:13 AM    [facility] [address] [place]  [id number]: [phone] Fax: [phone]

## 2017-03-16 NOTE — Patient Instructions (Signed)
Medication Instructions:  1) Use Nitro Spray as instructed. 2) START Imdur 16m once daily  Labwork: None  Testing/Procedures: None  Follow-Up: Your physician wants you to follow-up in: 6 months with Dr. STamala Julian  You will receive a reminder letter in the mail two months in advance. If you don't receive a letter, please call our office to schedule the follow-up appointment.   Any Other Special Instructions Will Be Listed Below (If Applicable).     If you need a refill on your cardiac medications before your next appointment, please call your pharmacy.

## 2017-03-28 NOTE — Progress Notes (Signed)
Chief Complaint  Patient presents with  . New Patient (Initial Visit)    establish care    HPI   Hyperlipidemia Pt was restarted on his Crestor after his lipid panel 2 months ago was elevated He sees Cardiology He is tolerating Crestor without concerns He has a myocardial bridge He reports that he was getting DOE with short activities so he is back on imdur  Asthma He reports that his asthma is stable  He reports that his asthma is well controlled and he has not had any exacerbations He has not used an inhaler in 5 years  OSA He states that getting treatment for his OSA with CPAP has helped with his breathing He denies daytime fatigue and somnolence He had a daytime sleepiness test and was negative for narcolepsy  Essential Hypertension Pt states that he has been compliant with his medications and exercise He has no side effects from his medications Review of Systems  Constitution: Negative for chills, fever and weight loss.  Cardiovascular: Negative for chest pain, claudication, dyspnea on exertion and palpitations.  Respiratory: Negative for cough, shortness of breath and wheezing.   Gastrointestinal: Negative for abdominal pain, nausea and vomiting.  Genitourinary: Negative for dysuria, frequency and urgency.  Neurological: Negative for dizziness, headaches and tingling.   BP Readings from Last 3 Encounters:  03/29/17 116/62  03/16/17 112/76  02/14/17 116/83    Past Medical History:  Diagnosis Date  . Asthma   . Coronary artery disease CARDIOLOGIST - DR Daneen Schick--- LAST VISIT OCT 2012   DENIES S & S  . Depression   . Dysuria   . GERD (gastroesophageal reflux disease)   . H/O hiatal hernia   . History of angina pectoris   . Hypertension   . Kidney calculi   . Left ureteral calculus   . Myocardial bridge MID LAD  . Shortness of breath    with excertion    Current Outpatient Prescriptions  Medication Sig Dispense Refill  . aspirin 81 MG EC tablet  Take 81 mg by mouth daily. Swallow whole.    Marland Kitchen aspirin-acetaminophen-caffeine (EXCEDRIN MIGRAINE) 250-250-65 MG tablet Take by mouth every 6 (six) hours as needed for headache.    . citalopram (CELEXA) 20 MG tablet Take 1 tablet (20 mg total) by mouth daily. Reported on 10/12/2015 90 tablet 1  . glucosamine-chondroitin 500-400 MG tablet Take 1 tablet by mouth 3 (three) times daily.    Marland Kitchen ibuprofen (ADVIL,MOTRIN) 800 MG tablet Take 1 tablet (800 mg total) by mouth 3 (three) times daily. 21 tablet 0  . isosorbide mononitrate (IMDUR) 30 MG 24 hr tablet Take 1 tablet (30 mg total) by mouth daily. 90 tablet 3  . metoprolol succinate (TOPROL-XL) 50 MG 24 hr tablet Take 1 tablet (50 mg total) by mouth daily. 90 tablet 1  . Multiple Vitamin (MULTIVITAMIN) tablet Take 1 tablet by mouth daily.    . nitroGLYCERIN (NITROLINGUAL) 0.4 MG/SPRAY spray Place 1 spray under the tongue every 5 (five) minutes x 3 doses as needed for chest pain. 12 g 3  . omeprazole (PRILOSEC) 40 MG capsule Take 1 capsule (40 mg total) by mouth daily. 90 capsule 1  . rosuvastatin (CRESTOR) 20 MG tablet Take 1 tablet (20 mg total) by mouth daily. 90 tablet 3  . SUMAtriptan (IMITREX) 25 MG tablet Take 25 mg by mouth every 2 (two) hours as needed for migraine. May repeat in 2 hours if headache persists or recurs.     No current  facility-administered medications for this visit.     Allergies:  Allergies  Allergen Reactions  . Ansaid [Ocufen] Nausea And Vomiting  . Chantix [Varenicline Tartrate] Other (See Comments)    Gi intolerances   . Codeine Other (See Comments)    Gi intolerance   . Erythromycin Other (See Comments)    Gi intolerances     Past Surgical History:  Procedure Laterality Date  . CARDIAC CATHETERIZATION  01-07-2010-- DR Daneen Schick   NORMAL LVF/ PATENT CORONARY ARTERIES/ MYOCARDIAL BRIDGE MID LAD  . CYSTOSCOPY W/ URETERAL STENT PLACEMENT  08/15/2011   Procedure: CYSTOSCOPY WITH STENT REPLACEMENT;  Surgeon: Fredricka Bonine, MD;  Location: [facility];  Service: Urology;  Laterality: Left;  stone basket extraction left, left ureteroscopy  . EXTRACORPOREAL SHOCK WAVE LITHOTRIPSY  08-07-11   LEFT  . [place]    Social History   Social History  . Marital status: Married    Spouse name: N/A  . Number of children: 0  . Years of education: N/A   Social History Main Topics  . Smoking status: Former Smoker    Packs/day: 3.00    Years: 10.00    Types: Cigarettes, Cigars    Quit date: 08/13/2005  . Smokeless tobacco: Former Systems developer  . Alcohol use No  . Drug use: No  . Sexual activity: Not Asked   Other Topics Concern  . None   Social History Narrative  . None    Review of Systems  Constitutional: Negative for chills, fever and weight loss.  Respiratory: Negative for cough, shortness of breath and wheezing.   Cardiovascular: Negative for chest pain, dyspnea on exertion, palpitations and claudication.  Gastrointestinal: Negative for abdominal pain, nausea and vomiting.  Genitourinary: Negative for dysuria, frequency and urgency.  Neurological: Negative for dizziness, tingling and headaches.    Objective: Vitals:   03/29/17 0801  BP: 116/62  Pulse: (!) 102  Resp: 16  Temp: 97.8 F (36.6 C)  TempSrc: Oral  SpO2: 97%  Weight: 173 lb 12.8 oz (78.8 kg)  Height: 5' 9.75" (1.772 m)   Body mass index is 25.12 kg/m.  Physical Exam  Constitutional: He is oriented to person, place, and time. He appears well-developed and well-nourished.  HENT:  Head: Normocephalic and atraumatic.  Eyes: Conjunctivae and EOM are normal.  Neck: Normal range of motion. No thyromegaly present.  Cardiovascular: Normal rate, regular rhythm and normal heart sounds.   Pulmonary/Chest: Effort normal and breath sounds normal. No respiratory distress. He has no wheezes.  Neurological: He is alert and oriented to person, place, and time.  Psychiatric: He has a normal mood and affect.  His behavior is normal. Judgment and thought content normal.      Assessment and Plan Tery was seen today for new patient (initial visit).  Diagnoses and all orders for this visit:  Encounter to establish care with new doctor   OSA on CPAP- stable on CPAP  Essential hypertension- bp is well controlled  Mixed hyperlipidemia- pt resumed Crestor   Well controlled intermittent asthma- stable      Yicel Shannon A Zidane Renner

## 2017-03-29 ENCOUNTER — Encounter: Payer: Self-pay | Admitting: Family Medicine

## 2017-03-29 ENCOUNTER — Ambulatory Visit (INDEPENDENT_AMBULATORY_CARE_PROVIDER_SITE_OTHER): Payer: 59 | Admitting: Family Medicine

## 2017-03-29 VITALS — BP 116/62 | HR 102 | Temp 97.8°F | Resp 16 | Ht 69.75 in | Wt 173.8 lb

## 2017-03-29 DIAGNOSIS — Z7689 Persons encountering health services in other specified circumstances: Secondary | ICD-10-CM | POA: Diagnosis not present

## 2017-03-29 DIAGNOSIS — E782 Mixed hyperlipidemia: Secondary | ICD-10-CM | POA: Diagnosis not present

## 2017-03-29 DIAGNOSIS — I10 Essential (primary) hypertension: Secondary | ICD-10-CM

## 2017-03-29 DIAGNOSIS — J4520 Mild intermittent asthma, uncomplicated: Secondary | ICD-10-CM

## 2017-03-29 DIAGNOSIS — Z9989 Dependence on other enabling machines and devices: Secondary | ICD-10-CM

## 2017-03-29 DIAGNOSIS — G4733 Obstructive sleep apnea (adult) (pediatric): Secondary | ICD-10-CM

## 2017-03-29 NOTE — Patient Instructions (Addendum)
IF you received an x-ray today, you will receive an invoice from [facility]. Please contact [facility] at [phone] with questions or concerns regarding your invoice.   IF you received labwork today, you will receive an invoice from [facility]. Please contact [facility] at [phone] with questions or concerns regarding your invoice.   Our billing staff will not be able to assist you with questions regarding bills from these companies.  You will be contacted with the lab results as soon as they are available. The fastest way to get your results is to activate your My Chart account. Instructions are located on the last page of this paperwork. If you have not heard from [place] regarding the results in 2 weeks, please contact this office.     Heart Disease Prevention Heart disease is a leading cause of death. There are many things you can do to help prevent heart disease. Be physically active Physical activity is good for your heart. It helps control your blood pressure, cholesterol levels, and weight. Try to be physically active every day. Ask your health care provider what activities are best for you. Be a healthy weight Extra weight can strain your heart and affect your blood pressure and cholesterol levels. Lose weight with diet and exercise if recommended by your health care provider. Eat heart-healthy foods Follow a healthy eating plan as recommended by your health care provider or dietitian. Heart-healthy foods include:  High-fiber foods. These include oat bran, oatmeal, and whole-grain breads and cereals.  Fruits and vegetables.  Avoid:  Alcohol.  Fried foods.  Foods high in saturated fat. These include meats, butter, whole dairy products, shortening, and coconut or palm oil.  Salty foods. These include canned food, luncheon meat, salty snacks, and fast food.  Keep your cholesterol levels under control Cholesterol is a substance that is used for many  important functions. When your cholesterol levels are high, cholesterol can stick to the insides of your blood vessels, making them narrow or clog. This can lead to chest pain (angina) and a heart attack. Keep your cholesterol levels under control as recommended by your health care provider. Have your cholesterol checked at least once a year. Target cholesterol levels (in mg/dL) for most people are:  Total cholesterol below 200.  LDL cholesterol below 100.  HDL cholesterol above 40 in men and above 50 in women.  Triglycerides below 150.  Keep your blood pressure under control Having high blood pressure (hypertension) puts you at risk for stroke and other forms of heart disease. Keep your blood pressure under control as recommended by your health care provider. Ask your health care provider if you need treatment to lower your blood pressure. If you are 23-78 years of age, have your blood pressure checked every 3-5 years. If you are 16 years of age or older, have your blood pressure checked every year. Do not use tobacco products Tobacco smoke can damage your heart and blood vessels. Do not use any tobacco products including cigarettes, chewing tobacco, or electronic cigarettes. If you need help quitting, ask your health care provider. Take medicines as directed Take medicines only as directed by your health care provider. Ask your health care provider whether you should take an aspirin every day. Taking aspirin can help reduce your risk of heart disease and stroke. Where to find more information: To find out more about heart disease, visit the American Heart Association's website at www.americanheart.org This information is not intended to replace advice given to  you by your health care provider. Make sure you discuss any questions you have with your health care provider. Document Released: 01/11/2004 Document Revised: 10/27/2015 Document Reviewed: 07/23/2013 Elsevier Interactive Patient  Education  2017 [employer].

## 2017-04-15 MED FILL — OMEPRAZOLE DR 40 MG CAPSULE: 40 | 90 days supply | Qty: 90 | Fill #1

## 2017-04-16 ENCOUNTER — Encounter: Payer: Self-pay | Admitting: Family Medicine

## 2017-04-16 DIAGNOSIS — K219 Gastro-esophageal reflux disease without esophagitis: Secondary | ICD-10-CM

## 2017-04-17 ENCOUNTER — Other Ambulatory Visit: Payer: 59 | Admitting: *Deleted

## 2017-04-17 DIAGNOSIS — E785 Hyperlipidemia, unspecified: Secondary | ICD-10-CM

## 2017-04-17 LAB — HEPATIC FUNCTION PANEL
ALBUMIN: 5 g/dL (ref 3.5–5.5)
ALT: 19 IU/L (ref 0–44)
AST: 19 IU/L (ref 0–40)
Alkaline Phosphatase: 54 IU/L (ref 39–117)
BILIRUBIN TOTAL: 0.4 mg/dL (ref 0.0–1.2)
BILIRUBIN, DIRECT: 0.12 mg/dL (ref 0.00–0.40)
TOTAL PROTEIN: 7.5 g/dL (ref 6.0–8.5)

## 2017-04-17 LAB — LIPID PANEL
CHOL/HDL RATIO: 3 ratio (ref 0.0–5.0)
Cholesterol, Total: 151 mg/dL (ref 100–199)
HDL: 51 mg/dL (ref >39)
LDL CALC: 81 mg/dL (ref 0–99)
Triglycerides: 93 mg/dL (ref 0–149)
VLDL CHOLESTEROL CAL: 19 mg/dL (ref 5–40)

## 2017-04-19 MED ORDER — OMEPRAZOLE 40 MG PO CPDR: 40.0000 mg | DELAYED_RELEASE_CAPSULE | Freq: Every day | ORAL | 3 refills | Status: DC

## 2017-05-13 MED FILL — CITALOPRAM HBR 20 MG TABLET: 20 | 90 days supply | Qty: 90 | Fill #1

## 2017-05-13 MED FILL — METOPROLOL SUCC ER 50 MG TA: 50 | 90 days supply | Qty: 90 | Fill #1

## 2017-05-15 DIAGNOSIS — G4733 Obstructive sleep apnea (adult) (pediatric): Secondary | ICD-10-CM | POA: Diagnosis not present

## 2017-05-28 MED FILL — ROSUVASTATIN CALCIUM 20 MG: 20 | 90 days supply | Qty: 90 | Fill #1

## 2017-06-13 MED FILL — ISOSORBIDE MN ER 30 MG TAB: 30 | 90 days supply | Qty: 90 | Fill #1

## 2017-06-27 ENCOUNTER — Telehealth: Payer: Self-pay | Admitting: Family Medicine

## 2017-06-27 NOTE — Telephone Encounter (Signed)
Tried to call pt to reschedule his appt with [facility] 10/01/17. [name] is not available that day.  The pt did not answer and I was not able to leave a VM as none came up.  If he calls back, please reschedule pt.   Thanks!

## 2017-06-28 ENCOUNTER — Encounter: Payer: Self-pay | Admitting: Family Medicine

## 2017-07-09 ENCOUNTER — Ambulatory Visit (INDEPENDENT_AMBULATORY_CARE_PROVIDER_SITE_OTHER): Payer: No Typology Code available for payment source | Admitting: Family Medicine

## 2017-07-09 ENCOUNTER — Other Ambulatory Visit: Payer: Self-pay

## 2017-07-09 ENCOUNTER — Encounter: Payer: Self-pay | Admitting: Family Medicine

## 2017-07-09 VITALS — BP 128/70 | HR 75 | Temp 98.0°F | Resp 17 | Ht 69.75 in | Wt 176.4 lb

## 2017-07-09 DIAGNOSIS — L301 Dyshidrosis [pompholyx]: Secondary | ICD-10-CM | POA: Diagnosis not present

## 2017-07-09 MED ORDER — CLOBETASOL PROPIONATE 0.05 % EX CREA: 1.0000 "application " | TOPICAL_CREAM | Freq: Two times a day (BID) | CUTANEOUS | 0 refills | Status: DC

## 2017-07-09 MED FILL — CLOBETASOL PROPIONATE 0.05: 0.05 | 15 days supply | Qty: 30 | Fill #0

## 2017-07-09 NOTE — Progress Notes (Signed)
Chief Complaint  Patient presents with  . Eczema    needs refill on cream.  Rash is healing pretty good.    HPI   Reports that he has eczema on his hands and his skin is healing He has an expired tube He gets worsening rash from washing his hands in the clinic and using hand sanitizers No fevers Only affects his hands No history of asthma No other rashes   4 review of systems  Past Medical History:  Diagnosis Date  . Asthma   . Coronary artery disease CARDIOLOGIST - [name]--- LAST VISIT OCT 2012   DENIES S & S  . Depression   . Dysuria   . GERD (gastroesophageal reflux disease)   . H/O hiatal hernia   . History of angina pectoris   . Hypertension   . Kidney calculi   . Left ureteral calculus   . Myocardial bridge MID LAD  . Shortness of breath    with excertion    Current Outpatient Medications  Medication Sig Dispense Refill  . aspirin 81 MG EC tablet Take 81 mg by mouth daily. Swallow whole.    Marland Kitchen aspirin-acetaminophen-caffeine (EXCEDRIN MIGRAINE) 250-250-65 MG tablet Take by mouth every 6 (six) hours as needed for headache.    . citalopram (CELEXA) 20 MG tablet Take 1 tablet (20 mg total) by mouth daily. Reported on 10/12/2015 90 tablet 1  . glucosamine-chondroitin 500-400 MG tablet Take 1 tablet by mouth 3 (three) times daily.    Marland Kitchen ibuprofen (ADVIL,MOTRIN) 800 MG tablet Take 1 tablet (800 mg total) by mouth 3 (three) times daily. 21 tablet 0  . isosorbide mononitrate (IMDUR) 30 MG 24 hr tablet Take 1 tablet (30 mg total) by mouth daily. 90 tablet 3  . metoprolol succinate (TOPROL-XL) 50 MG 24 hr tablet Take 1 tablet (50 mg total) by mouth daily. 90 tablet 1  . Multiple Vitamin (MULTIVITAMIN) tablet Take 1 tablet by mouth daily.    . nitroGLYCERIN (NITROLINGUAL) 0.4 MG/SPRAY spray Place 1 spray under the tongue every 5 (five) minutes x 3 doses as needed for chest pain. 12 g 3  . omeprazole (PRILOSEC) 40 MG capsule Take 1 capsule (40 mg total) daily by mouth.  90 capsule 3  . SUMAtriptan (IMITREX) 25 MG tablet Take 25 mg by mouth every 2 (two) hours as needed for migraine. May repeat in 2 hours if headache persists or recurs.    . clobetasol cream (TEMOVATE) 8.93 % Apply 1 application topically 2 (two) times daily. 30 g 0  . rosuvastatin (CRESTOR) 20 MG tablet Take 1 tablet (20 mg total) by mouth daily. 90 tablet 3   No current facility-administered medications for this visit.     Allergies:  Allergies  Allergen Reactions  . Ansaid [Ocufen] Nausea And Vomiting  . Chantix [Varenicline Tartrate] Other (See Comments)    Gi intolerances   . Codeine Other (See Comments)    Gi intolerance   . Erythromycin Other (See Comments)    Gi intolerances     Past Surgical History:  Procedure Laterality Date  . CARDIAC CATHETERIZATION  01-07-2010-- [name]   NORMAL LVF/ PATENT CORONARY ARTERIES/ MYOCARDIAL BRIDGE MID LAD  . CYSTOSCOPY W/ URETERAL STENT PLACEMENT  08/15/2011   Procedure: CYSTOSCOPY WITH STENT REPLACEMENT;  Surgeon: Joshua Bonine, MD;  Location: [facility];  Service: Urology;  Laterality: Left;  stone basket extraction left, left ureteroscopy  . EXTRACORPOREAL SHOCK WAVE LITHOTRIPSY  08-07-11  LEFT  . [place]    Social History   Socioeconomic History  . Marital status: Married    Spouse name: None  . Number of children: 0  . Years of education: None  . Highest education level: None  Social Needs  . Financial resource strain: None  . Food insecurity - worry: None  . Food insecurity - inability: None  . Transportation needs - medical: None  . Transportation needs - non-medical: None  Occupational History  . None  Tobacco Use  . Smoking status: Former Smoker    Packs/day: 3.00    Years: 10.00    Pack years: 30.00    Types: Cigarettes, Cigars    Last attempt to quit: 08/13/2005    Years since quitting: 11.9  . Smokeless tobacco: Former Network engineer and Sexual Activity  .  Alcohol use: No    Alcohol/week: 0.0 oz  . Drug use: No  . Sexual activity: None  Other Topics Concern  . None  Social History Narrative  . None    Family History  Problem Relation Age of Onset  . Emphysema Paternal Grandfather   . Aortic aneurysm Paternal Grandfather   . Heart attack Brother   . Cancer - Lung Father   . Hypertension Mother   . Hypertension Paternal Grandmother   . Alzheimer's disease Maternal Grandmother   . Stroke Maternal Grandfather      ROS Review of Systems See HPI Constitution: No fevers or chills No malaise No diaphoresis Skin: see hpi Eyes: no blurry vision, no double vision GU: no dysuria or hematuria Neuro: no dizziness or headaches all others reviewed and negative   Objective: Vitals:   07/09/17 0818  BP: 128/70  Pulse: 75  Resp: 17  Temp: 98 F (36.7 C)  TempSrc: Oral  SpO2: 96%  Weight: 176 lb 6.4 oz (80 kg)  Height: 5' 9.75" (1.772 m)    Physical Exam  Constitutional: He appears well-developed and well-nourished.  HENT:  Head: Normocephalic and atraumatic.  Eyes: Conjunctivae and EOM are normal.  Skin:  Excoriation at knuckle, mild erythema in web of fingers     Assessment and Plan [name] was seen today for eczema.  Diagnoses and all orders for this visit:  Dyshidrotic eczema -     clobetasol cream (TEMOVATE) 0.05 %; Apply 1 application topically 2 (two) times daily.     [name]

## 2017-07-09 NOTE — Patient Instructions (Addendum)
IF you received an x-ray today, you will receive an invoice from [facility]. Please contact [facility] at [phone] with questions or concerns regarding your invoice.   IF you received labwork today, you will receive an invoice from [facility]. Please contact [facility] at [phone] with questions or concerns regarding your invoice.   Our billing staff will not be able to assist you with questions regarding bills from these companies.  You will be contacted with the lab results as soon as they are available. The fastest way to get your results is to activate your My Chart account. Instructions are located on the last page of this paperwork. If you have not heard from [place] regarding the results in 2 weeks, please contact this office.     Dyshidrotic Eczema Dyshidrotic eczema (pompholyx) is a type of eczema that causes very itchy (pruritic), fluid-filled blisters (vesicles) to form on the hands and feet. It can affect people of any age, but is more common before the age of 47. There is no cure, but treatment and certain lifestyle changes can help relieve symptoms. What are the causes? The cause of this condition is not known. What increases the risk? You are more likely to develop this condition if:  You wash your hands frequently.  You have a personal history or family history of eczema, allergies, asthma, or hay fever.  You are allergic to metals such as nickel or cobalt.  You work with cement.  You smoke.  What are the signs or symptoms? Symptoms of this condition may affect the hands, feet, or both. Symptoms may come and go (recur), and may include:  Severe itching, which may happen before blisters appear.  Blisters. These may form suddenly. ? In the early stages, blisters may form near the fingertips. ? In severe cases, blisters may grow to large blister masses (bullae). ? Blisters resolve in 2-3 weeks without bursting. This is followed by a dry phase  in which itching eases.  Pain and swelling.  Cracks or long, narrow openings (fissures) in the skin.  Severe dryness.  Ridges on the nails.  How is this diagnosed? This condition may be diagnosed based on:  A physical exam.  Your symptoms.  Your medical history.  Skin scrapings to rule out a fungal infection.  Testing a swab of fluid for bacteria (culture).  Removing and checking a small piece of skin (biopsy) in order to test for infection or to rule out other conditions.  Skin patch tests. These tests involve taking patches that contain possible allergens and placing them on your back. Your health care provider will wait a few days and then check to see if an allergic reaction occurred. These tests may be done if your health care provider suspects allergic reactions, or to rule out other types of eczema.  You may be referred to a health care provider who specializes in the skin (dermatologist) to help diagnose and treat this condition. How is this treated? There is no cure for this condition, but treatment can help relieve symptoms. Depending on how many blisters you have and how severe they are, your health care provider may suggest:  Avoiding allergens, irritants, or triggers that worsen symptoms. This may involve lifestyle changes such as: ? Using different lotions or soaps. ? Avoiding hot weather or places that will cause you to sweat a lot. ? Managing stress with coping techniques such as relaxation and exercise, and asking for help when you need it. ? Diet  changes as recommended by your health care provider.  Using a clean, damp towel (cool compress) to relieve symptoms.  Soaking in a bath that contains a type of salt that relieves irritation (aluminum acetate soaks).  Medicine taken by mouth to reduce itching (oral antihistamines).  Medicine applied to the skin to reduce swelling and irritation (topical corticosteroids).  Medicine that reduces the activity of the  body's disease-fighting system (immunosuppressants) to treat inflammation. This may be given in severe cases.  Antibiotic medicines to treat bacterial infection.  Light therapy (phototherapy). This involves shining ultraviolet (UV) light on affected skin in order to reduce itchiness and inflammation.  Follow these instructions at home: Bathing and skin care  Wash skin gently. After bathing or washing your hands, pat your skin dry. Avoid rubbing your skin.  Remove all jewelry before bathing. If the skin under the jewelry stays wet, blisters may form or get worse.  Apply cool compresses as told by your health care provider: ? Soak a clean towel in cool water. ? Wring out excess water until towel is damp. ? Place the towel over affected skin. Leave the towel on for 20 minutes at a time, 2-3 times a day.  Use mild soaps, cleansers, and lotions that do not contain dyes, perfumes, or other irritants.  Keep your skin hydrated. To do this: ? Avoid very hot water. Take lukewarm baths or showers. ? Apply moisturizer within three minutes of bathing. This locks in moisture. Medicines  Take and apply over-the-counter and prescription medicines only as told by your health care provider.  If you were prescribed antibiotic medicine, take or apply it as told by your health care provider. Do not stop using the antibiotic even if you start to feel better. General instructions  Identify and avoid triggers and allergens.  Keep fingernails short to avoid breaking open the skin while scratching.  Use waterproof gloves to protect your hands when doing work that keeps your hands wet for a long time.  Wear socks to keep your feet dry.  Do not use any products that contain nicotine or tobacco, such as cigarettes and e-cigarettes. If you need help quitting, ask your health care provider.  Keep all follow-up visits as told by your health care provider. This is important. Contact a health care provider  if:  You have symptoms that do not go away.  You have signs of infection, such as: ? Crusting, pus, or a bad smell. ? More redness, swelling, or pain. ? Increased warmth in the affected area. Summary  Dyshidrotic eczema (pompholyx) is a type of eczema that causes very itchy (pruritic), fluid-filled blisters (vesicles) to form on the hands and feet.  The cause of this condition is not known.  There is no cure for this condition, but treatment can help relieve symptoms. Treatment depends on how many blisters you have and how severe they are.  Use mild soaps, cleansers, and lotions that do not contain dyes, perfumes, or other irritants. Keep your skin hydrated. This information is not intended to replace advice given to you by your health care provider. Make sure you discuss any questions you have with your health care provider. Document Released: 10/12/2016 Document Revised: 10/12/2016 Document Reviewed: 10/12/2016 Elsevier Interactive Patient Education  2018 [employer].

## 2017-07-16 ENCOUNTER — Ambulatory Visit: Payer: 59 | Admitting: Family Medicine

## 2017-08-03 ENCOUNTER — Encounter: Payer: Self-pay | Admitting: Family Medicine

## 2017-08-03 ENCOUNTER — Other Ambulatory Visit: Payer: Self-pay

## 2017-08-03 DIAGNOSIS — K219 Gastro-esophageal reflux disease without esophagitis: Secondary | ICD-10-CM

## 2017-08-03 MED ORDER — OMEPRAZOLE 40 MG PO CPDR: 40.0000 mg | DELAYED_RELEASE_CAPSULE | Freq: Every day | ORAL | 2 refills | Status: DC

## 2017-08-03 MED FILL — OMEPRAZOLE DR 40 MG CAPSULE: 40 | 90 days supply | Qty: 90 | Fill #0

## 2017-08-13 ENCOUNTER — Encounter: Payer: Self-pay | Admitting: Family Medicine

## 2017-08-13 ENCOUNTER — Telehealth: Payer: Self-pay

## 2017-08-13 NOTE — Telephone Encounter (Signed)
PA started for Citalopram HBR 20 mg Tab Awaiting Response  Contact plan to follow up on [id number]

## 2017-08-14 ENCOUNTER — Other Ambulatory Visit: Payer: Self-pay | Admitting: Family Medicine

## 2017-08-14 ENCOUNTER — Other Ambulatory Visit: Payer: Self-pay | Admitting: *Deleted

## 2017-08-14 ENCOUNTER — Telehealth: Payer: Self-pay | Admitting: Family Medicine

## 2017-08-14 DIAGNOSIS — F329 Major depressive disorder, single episode, unspecified: Secondary | ICD-10-CM

## 2017-08-14 DIAGNOSIS — F32A Depression, unspecified: Secondary | ICD-10-CM

## 2017-08-14 DIAGNOSIS — I10 Essential (primary) hypertension: Secondary | ICD-10-CM

## 2017-08-14 MED ORDER — METOPROLOL SUCCINATE ER 50 MG PO TB24: 50.0000 mg | ORAL_TABLET | Freq: Every day | ORAL | 0 refills | Status: DC

## 2017-08-14 MED ORDER — OSELTAMIVIR PHOSPHATE 75 MG PO CAPS
75.0000 mg | ORAL_CAPSULE | Freq: Two times a day (BID) | ORAL | 0 refills | Status: DC
Start: 2017-08-14 — End: 2019-01-31

## 2017-08-14 MED FILL — METOPROLOL SUCC ER 50 MG TA: 50 | 90 days supply | Qty: 90 | Fill #0

## 2017-08-14 MED FILL — OSELTAMIVIR PHOSPHATE 75 MG: 75 | 5 days supply | Qty: 10 | Fill #0

## 2017-08-14 NOTE — Telephone Encounter (Unsigned)
Copied from [place] [id number]. Topic: Quick Communication - Rx Refill/Question >> Aug 14, 2017  1:53 PM Carolyn Stare wrote: Medication  citalopram (CELEXA) 20 MG tablet           metoprolol succinate (TOPROL-XL) 50 MG 24 hr tablet    Has the patient contacted their pharmacy Yes    Preferred [facility]    Agent: Please be advised that RX refills may take up to 3 business days. We ask that you follow-up with your pharmacy.

## 2017-08-14 NOTE — Telephone Encounter (Signed)
°  [name] is no longer under [name] (she sent in prescriptions and they were denied )  [name] is now his PCP and she is the one that needs to send in prescriptions.  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   Has been patient of [name] since 10/18.   He is now out of his blood pressure medication and needs asap.   [place], [place] [address]  [place] [address]  Phone: [phone] Fax: [phone]

## 2017-08-14 NOTE — Telephone Encounter (Signed)
Dr. Nolon Rod,  These refill request were send to [place] by mistake, patient no longer sees [place] as  Primary care. Please advise if they can be refilled. Thanks!

## 2017-08-15 ENCOUNTER — Encounter: Payer: Self-pay | Admitting: Family Medicine

## 2017-08-15 DIAGNOSIS — Z8669 Personal history of other diseases of the nervous system and sense organs: Secondary | ICD-10-CM

## 2017-08-16 NOTE — Telephone Encounter (Signed)
Metoprolopl refilled on 08/14/17

## 2017-08-17 ENCOUNTER — Other Ambulatory Visit: Payer: Self-pay | Admitting: Family Medicine

## 2017-08-17 DIAGNOSIS — F32A Depression, unspecified: Secondary | ICD-10-CM

## 2017-08-17 DIAGNOSIS — F329 Major depressive disorder, single episode, unspecified: Secondary | ICD-10-CM

## 2017-08-17 MED ORDER — CITALOPRAM HYDROBROMIDE 20 MG PO TABS: 20.0000 mg | ORAL_TABLET | Freq: Every day | ORAL | 1 refills | Status: DC

## 2017-08-17 MED FILL — CITALOPRAM HBR 20 MG TABLET: 20 | 90 days supply | Qty: 90 | Fill #0

## 2017-08-20 MED ORDER — SUMATRIPTAN SUCCINATE 25 MG PO TABS: 25.0000 mg | ORAL_TABLET | ORAL | 6 refills | Status: DC | PRN

## 2017-08-21 MED FILL — SUMATRIPTAN SUCC 25 MG TAB: 25 | 30 days supply | Qty: 8 | Fill #0

## 2017-08-27 MED FILL — ROSUVASTATIN CALCIUM 20 MG: 20 | 90 days supply | Qty: 90 | Fill #2

## 2017-08-29 MED FILL — TOPIRAMATE 25 MG TABLET: 25 | 28 days supply | Qty: 70 | Fill #0

## 2017-08-29 MED FILL — CLOBETASOL 0.05% OINTMENT: 0.05 | 30 days supply | Qty: 60 | Fill #0

## 2017-09-09 MED FILL — ISOSORBIDE MN ER 30 MG TAB: 30 | 90 days supply | Qty: 90 | Fill #2

## 2017-09-24 MED FILL — TOPIRAMATE 100 MG TABS: 100 | 90 days supply | Qty: 90 | Fill #0

## 2017-10-01 ENCOUNTER — Ambulatory Visit: Payer: 59 | Admitting: Family Medicine

## 2017-10-04 ENCOUNTER — Ambulatory Visit: Payer: Self-pay | Admitting: Family Medicine

## 2017-11-09 MED FILL — METOPROLOL SUCCINATE ER 50: 50 | 90 days supply | Qty: 90 | Fill #0

## 2017-11-09 MED FILL — OMEPRAZOLE 40 MG CPDR: 40 | 90 days supply | Qty: 90 | Fill #1

## 2017-11-12 MED FILL — CITALOPRAM HBR 20 MG TABLET: 20 | 90 days supply | Qty: 90 | Fill #1

## 2017-11-25 MED FILL — ROSUVASTATIN CALCIUM 20 MG: 20 | 90 days supply | Qty: 90 | Fill #3

## 2017-12-09 MED FILL — ISOSORBIDE MN ER 30 MG TAB: 30 | 90 days supply | Qty: 90 | Fill #3

## 2017-12-24 MED FILL — TOPIRAMATE 100 MG TABLET: 100 | 90 days supply | Qty: 90 | Fill #0

## 2018-02-18 MED FILL — ROSUVASTATIN CALCIUM 20 MG: 20 | 90 days supply | Qty: 90 | Fill #0

## 2018-02-18 MED FILL — CITALOPRAM HBR 20 MG TABLET: 20 | 90 days supply | Qty: 90 | Fill #0

## 2018-03-10 MED FILL — METOPROLOL SUCCINATE ER 50: 50 | 90 days supply | Qty: 90 | Fill #1

## 2018-03-11 MED FILL — OMEPRAZOLE 40 MG CPDR: 40 | 90 days supply | Qty: 90 | Fill #0

## 2018-04-15 MED FILL — ISOSORBIDE MN ER 30 MG TAB: 30 | 90 days supply | Qty: 90 | Fill #0

## 2018-04-22 MED FILL — TOPIRAMATE 100 MG TABLET: 100 | 90 days supply | Qty: 90 | Fill #1

## 2018-04-29 MED FILL — CITALOPRAM HBR 40 MG TABLET: 40 | 90 days supply | Qty: 90 | Fill #0

## 2018-04-30 MED FILL — PANTOPRAZOLE SOD DR 40 MG T: 40 | 90 days supply | Qty: 90 | Fill #0

## 2018-05-19 MED FILL — ROSUVASTATIN CALCIUM 20 MG: 20 | 90 days supply | Qty: 90 | Fill #1

## 2018-05-23 ENCOUNTER — Encounter (INDEPENDENT_AMBULATORY_CARE_PROVIDER_SITE_OTHER): Payer: No Typology Code available for payment source | Admitting: Ophthalmology

## 2018-05-23 DIAGNOSIS — H442A2 Degenerative myopia with choroidal neovascularization, left eye: Secondary | ICD-10-CM

## 2018-05-23 DIAGNOSIS — H43813 Vitreous degeneration, bilateral: Secondary | ICD-10-CM

## 2018-05-23 DIAGNOSIS — H35413 Lattice degeneration of retina, bilateral: Secondary | ICD-10-CM | POA: Diagnosis not present

## 2018-05-23 DIAGNOSIS — H33303 Unspecified retinal break, bilateral: Secondary | ICD-10-CM

## 2018-05-23 DIAGNOSIS — H4423 Degenerative myopia, bilateral: Secondary | ICD-10-CM | POA: Diagnosis not present

## 2018-05-23 DIAGNOSIS — H35033 Hypertensive retinopathy, bilateral: Secondary | ICD-10-CM

## 2018-05-23 DIAGNOSIS — I10 Essential (primary) hypertension: Secondary | ICD-10-CM | POA: Diagnosis not present

## 2018-05-23 MED FILL — BESIVANCE 0.6% SUSP: 0.6 | 18 days supply | Qty: 5 | Fill #0

## 2018-05-27 ENCOUNTER — Encounter (INDEPENDENT_AMBULATORY_CARE_PROVIDER_SITE_OTHER): Payer: No Typology Code available for payment source | Admitting: Ophthalmology

## 2018-05-27 DIAGNOSIS — H33302 Unspecified retinal break, left eye: Secondary | ICD-10-CM

## 2018-05-28 ENCOUNTER — Other Ambulatory Visit (HOSPITAL_COMMUNITY)
Admission: RE | Admit: 2018-05-28 | Discharge: 2018-05-28 | Disposition: A | Discharge status: Home / Self Care | Payer: No Typology Code available for payment source | Source: Ambulatory Visit | Attending: Ophthalmology | Admitting: Ophthalmology

## 2018-05-28 DIAGNOSIS — H3092 Unspecified chorioretinal inflammation, left eye: Secondary | ICD-10-CM | POA: Diagnosis present

## 2018-05-28 LAB — CBC
HEMATOCRIT: 45.5 % (ref 39.0–52.0)
Hemoglobin: 16 g/dL (ref 13.0–17.0)
MCH: 31.3 pg (ref 26.0–34.0)
MCHC: 35.2 g/dL (ref 30.0–36.0)
MCV: 88.9 fL (ref 80.0–100.0)
NRBC: 0 % (ref 0.0–0.2)
PLATELETS: 235 10*3/uL (ref 150–400)
RBC: 5.12 MIL/uL (ref 4.22–5.81)
RDW: 12.5 % (ref 11.5–15.5)
WBC: 6.3 10*3/uL (ref 4.0–10.5)

## 2018-05-29 LAB — TOXOPLASMA GONDII ANTIBODY, IGM: Toxoplasma Antibody- IgM: <3 AU/mL (ref 0.0–7.9)

## 2018-05-29 LAB — INFECT DISEASE AB IGM REFLEX 1

## 2018-05-31 ENCOUNTER — Encounter (INDEPENDENT_AMBULATORY_CARE_PROVIDER_SITE_OTHER): Payer: No Typology Code available for payment source | Admitting: Ophthalmology

## 2018-05-31 DIAGNOSIS — H33301 Unspecified retinal break, right eye: Secondary | ICD-10-CM | POA: Diagnosis not present

## 2018-06-06 MED FILL — SUMATRIPTAN SUCC 25 MG TAB: 25 | 84 days supply | Qty: 24 | Fill #0

## 2018-06-07 MED FILL — buPROPion HCL ER (XL) 150 M: 150 | 30 days supply | Qty: 30 | Fill #0

## 2018-06-10 MED FILL — METOPROLOL SUCCINATE ER 50: 50 | 90 days supply | Qty: 90 | Fill #2

## 2018-06-20 ENCOUNTER — Encounter (INDEPENDENT_AMBULATORY_CARE_PROVIDER_SITE_OTHER): Payer: No Typology Code available for payment source | Admitting: Ophthalmology

## 2018-06-20 DIAGNOSIS — H43813 Vitreous degeneration, bilateral: Secondary | ICD-10-CM | POA: Diagnosis not present

## 2018-06-20 DIAGNOSIS — H35033 Hypertensive retinopathy, bilateral: Secondary | ICD-10-CM

## 2018-06-20 DIAGNOSIS — I10 Essential (primary) hypertension: Secondary | ICD-10-CM

## 2018-06-20 DIAGNOSIS — H2513 Age-related nuclear cataract, bilateral: Secondary | ICD-10-CM | POA: Diagnosis not present

## 2018-06-20 DIAGNOSIS — H4423 Degenerative myopia, bilateral: Secondary | ICD-10-CM

## 2018-06-20 DIAGNOSIS — H442A2 Degenerative myopia with choroidal neovascularization, left eye: Secondary | ICD-10-CM

## 2018-06-20 DIAGNOSIS — H33303 Unspecified retinal break, bilateral: Secondary | ICD-10-CM

## 2018-07-01 MED FILL — buPROPion HCL ER (XL) 150 M: 150 | 90 days supply | Qty: 90 | Fill #0

## 2018-07-18 ENCOUNTER — Encounter (INDEPENDENT_AMBULATORY_CARE_PROVIDER_SITE_OTHER): Payer: 59 | Admitting: Ophthalmology

## 2018-07-18 DIAGNOSIS — I10 Essential (primary) hypertension: Secondary | ICD-10-CM

## 2018-07-18 DIAGNOSIS — H318 Other specified disorders of choroid: Secondary | ICD-10-CM

## 2018-07-18 DIAGNOSIS — H43813 Vitreous degeneration, bilateral: Secondary | ICD-10-CM | POA: Diagnosis not present

## 2018-07-18 DIAGNOSIS — H4423 Degenerative myopia, bilateral: Secondary | ICD-10-CM

## 2018-07-18 DIAGNOSIS — H35033 Hypertensive retinopathy, bilateral: Secondary | ICD-10-CM | POA: Diagnosis not present

## 2018-07-21 MED FILL — TOPIRAMATE 100 MG TABLET: 100 | 90 days supply | Qty: 90 | Fill #2

## 2018-07-21 MED FILL — ISOSORBIDE MN ER 30 MG TAB: 30 | 90 days supply | Qty: 90 | Fill #1

## 2018-07-28 MED FILL — CITALOPRAM HBR 40 MG TABLET: 40 | 90 days supply | Qty: 90 | Fill #1

## 2018-07-29 MED FILL — PANTOPRAZOLE SOD DR 40 MG T: 40 | 90 days supply | Qty: 90 | Fill #1

## 2018-08-07 DIAGNOSIS — G4733 Obstructive sleep apnea (adult) (pediatric): Secondary | ICD-10-CM | POA: Diagnosis not present

## 2018-08-15 ENCOUNTER — Encounter (INDEPENDENT_AMBULATORY_CARE_PROVIDER_SITE_OTHER): Payer: 59 | Admitting: Ophthalmology

## 2018-08-15 DIAGNOSIS — I10 Essential (primary) hypertension: Secondary | ICD-10-CM | POA: Diagnosis not present

## 2018-08-15 DIAGNOSIS — H35033 Hypertensive retinopathy, bilateral: Secondary | ICD-10-CM

## 2018-08-15 DIAGNOSIS — H442A2 Degenerative myopia with choroidal neovascularization, left eye: Secondary | ICD-10-CM

## 2018-08-15 DIAGNOSIS — H2513 Age-related nuclear cataract, bilateral: Secondary | ICD-10-CM | POA: Diagnosis not present

## 2018-08-15 DIAGNOSIS — H43813 Vitreous degeneration, bilateral: Secondary | ICD-10-CM

## 2018-08-15 DIAGNOSIS — H33303 Unspecified retinal break, bilateral: Secondary | ICD-10-CM

## 2018-08-15 DIAGNOSIS — H4421 Degenerative myopia, right eye: Secondary | ICD-10-CM

## 2018-08-15 MED FILL — BESIVANCE 0.6% SUSP: 0.6 | 18 days supply | Qty: 5 | Fill #1

## 2018-08-16 ENCOUNTER — Encounter (INDEPENDENT_AMBULATORY_CARE_PROVIDER_SITE_OTHER): Payer: 59 | Admitting: Ophthalmology

## 2018-08-16 DIAGNOSIS — H1132 Conjunctival hemorrhage, left eye: Secondary | ICD-10-CM

## 2018-08-19 MED FILL — ROSUVASTATIN CALCIUM 20 MG: 20 | 90 days supply | Qty: 90 | Fill #2

## 2018-09-02 MED FILL — METOPROLOL SUCCINATE ER 50: 50 | 90 days supply | Qty: 90 | Fill #0

## 2018-09-03 MED FILL — SUMATRIPTAN SUCC 25 MG TAB: 25 | 30 days supply | Qty: 18 | Fill #0

## 2018-09-12 ENCOUNTER — Encounter (INDEPENDENT_AMBULATORY_CARE_PROVIDER_SITE_OTHER): Payer: 59 | Admitting: Ophthalmology

## 2018-09-23 MED FILL — buPROPion HCL ER (XL) 150 M: 150 | 90 days supply | Qty: 90 | Fill #1

## 2018-09-27 IMAGING — MR MR KNEE*L* W/O CM
4 of 7 series · 19 of 40 positions shown · non-contrast
Comparison: None.

CLINICAL DATA: Bilateral knee pain for 2 years, worsening over the
last 6-9 months. Worsening pain when walking and running. No
previous relevant surgery.

EXAM:
MRI OF THE LEFT KNEE WITHOUT CONTRAST
TECHNIQUE: Multiplanar, multisequence MR imaging of the knee was performed. No
intravenous contrast was administered.

[Series 3: PD fat-sat · axial · 4.0mm · 0.29mm/px · z∈[-85,+34]mm · 7 of 25 slices shown (1 of 4)]
[im 1/25]
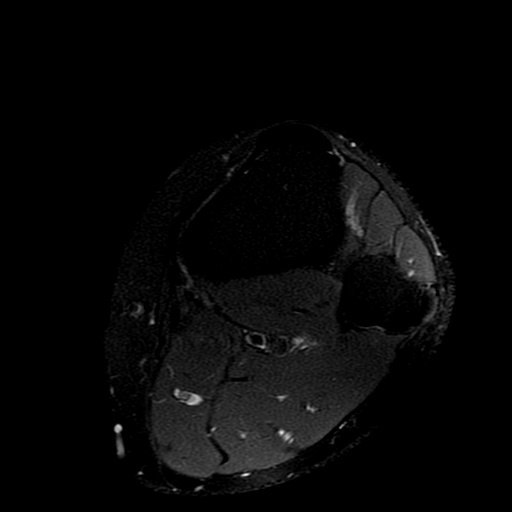
[im 5/25]
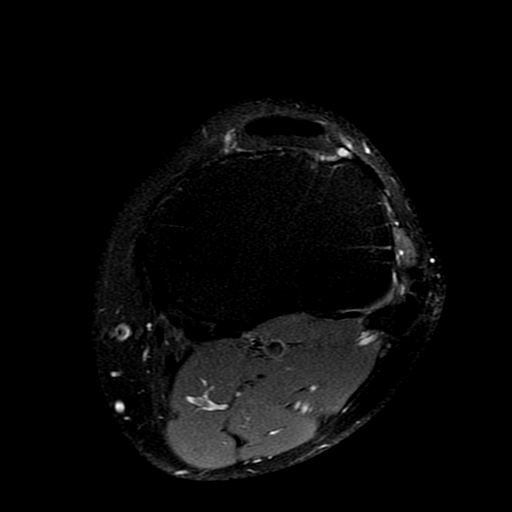
[im 9/25]
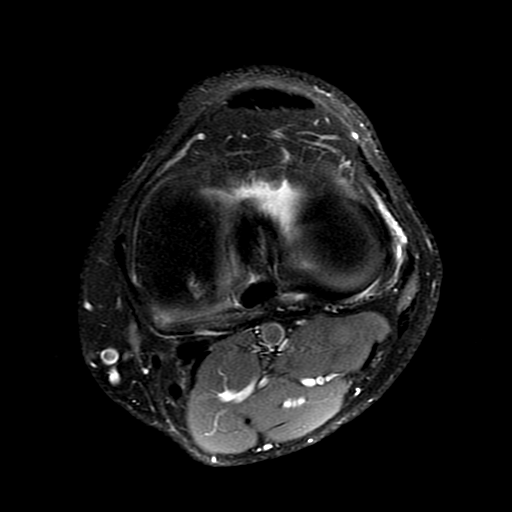
[im 13/25]
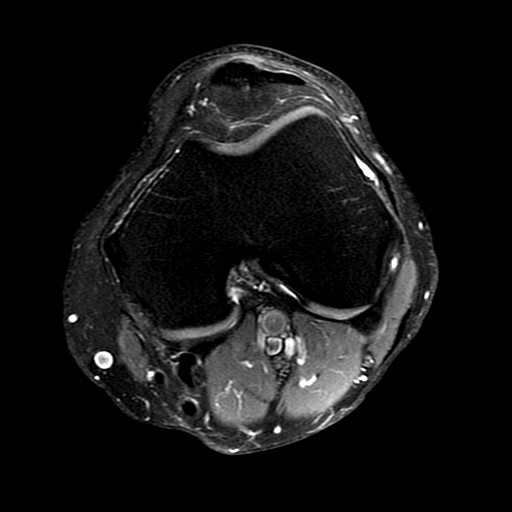
[im 17/25]
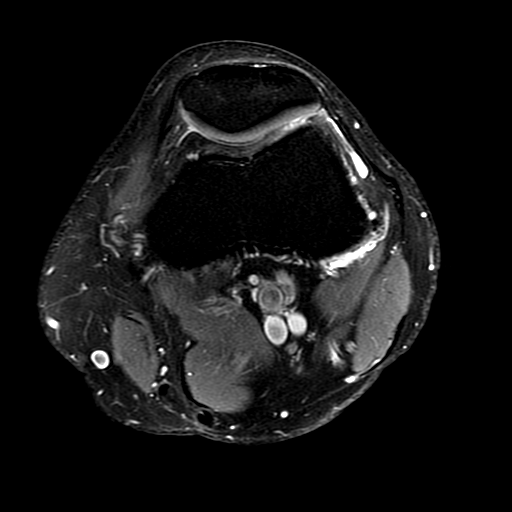
[im 21/25]
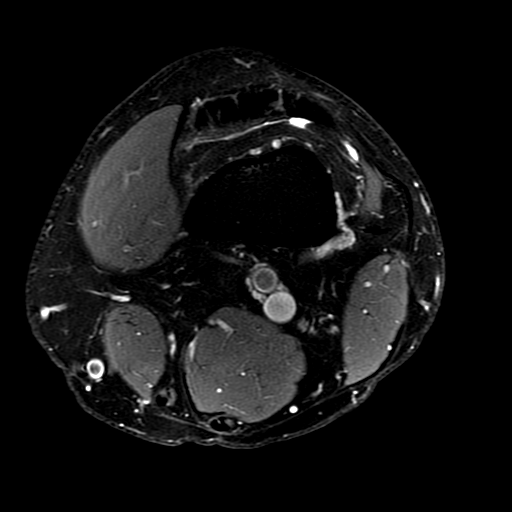
[im 25/25]
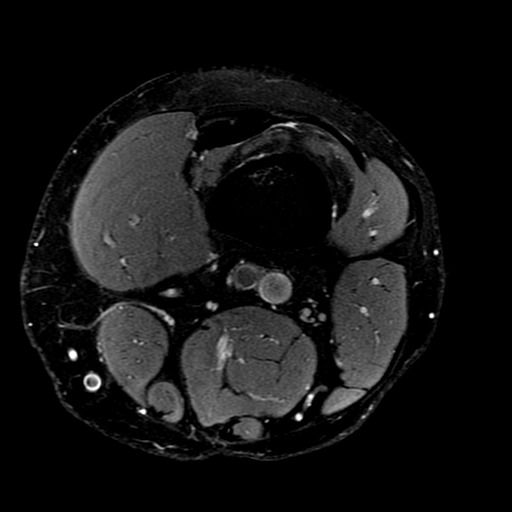

[Series 4: PD fat-sat · coronal · 4.0mm · 0.31mm/px · 6 of 21 slices shown (2 of 4)]
[im 1/21]
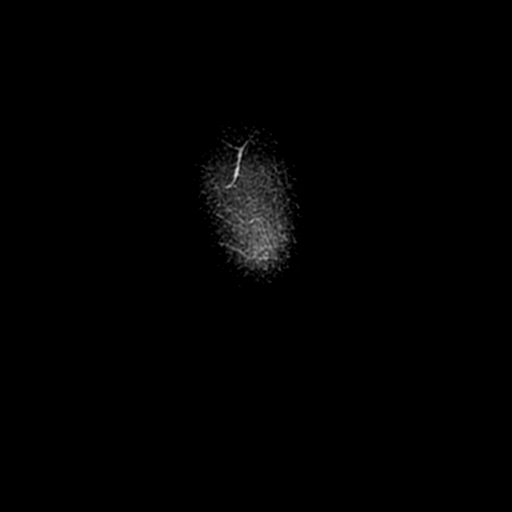
[im 5/21]
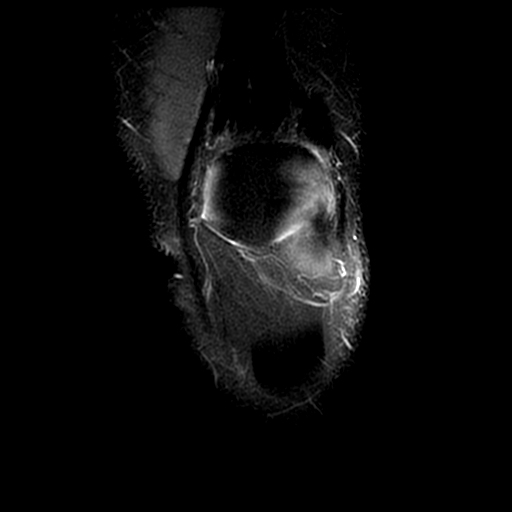
[im 9/21]
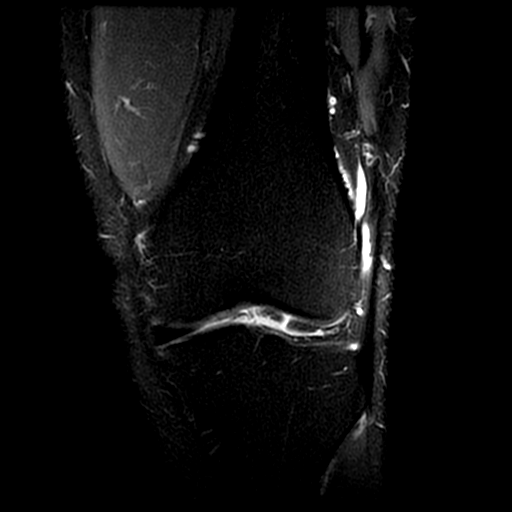
[im 13/21]
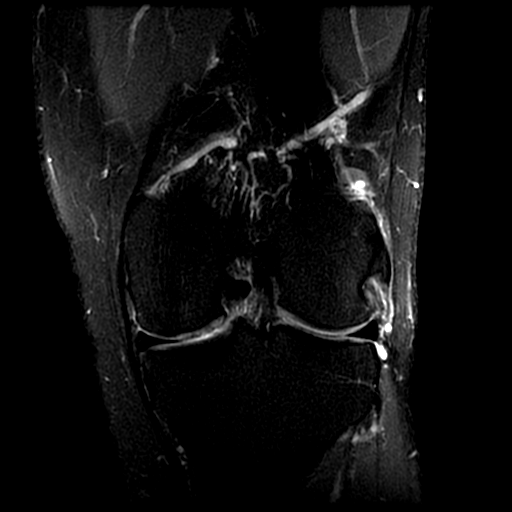
[im 17/21]
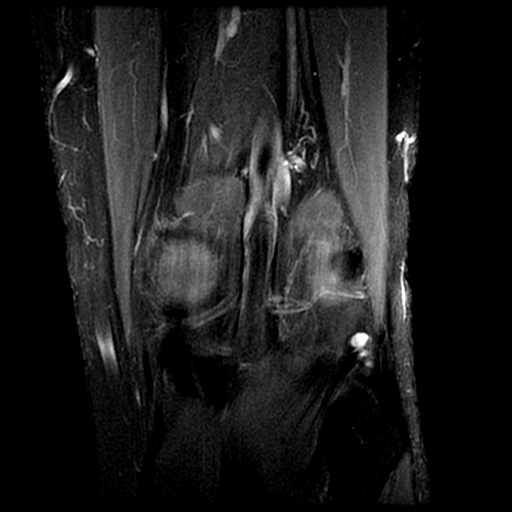
[im 21/21]
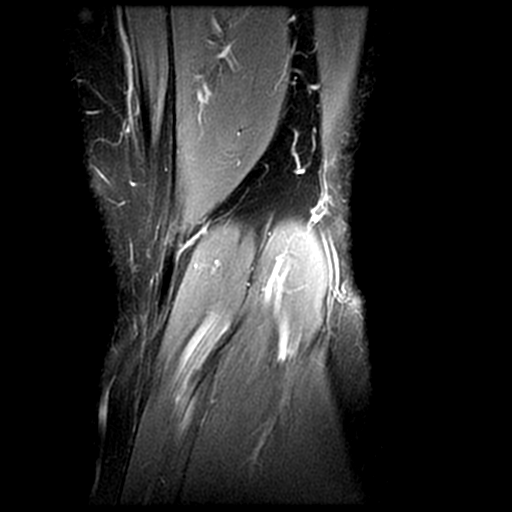

[Series 7: PD fat-sat · sagittal · 4.0mm · 0.31mm/px · 3 of 22 slices shown (3 of 4)]
[im 5/22]
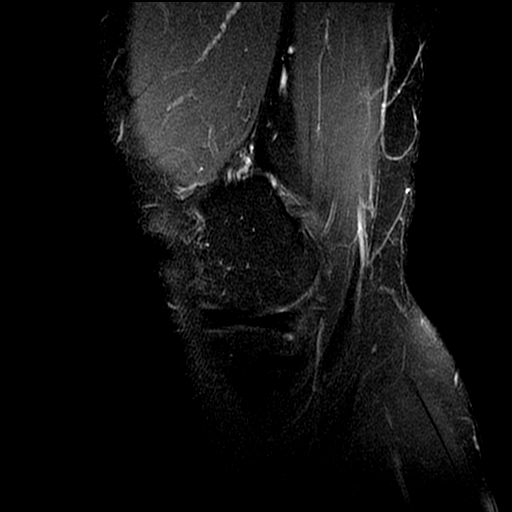
[im 13/22]
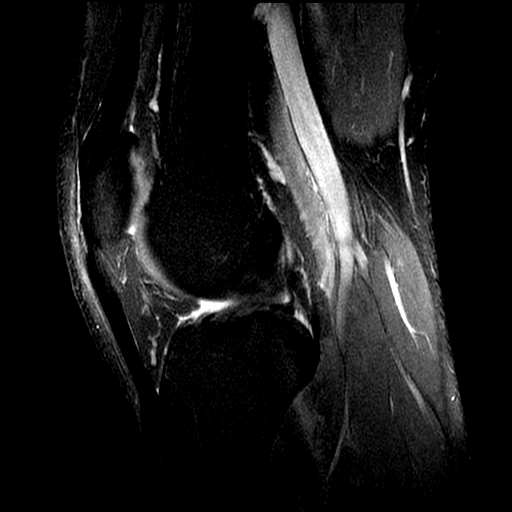
[im 22/22]
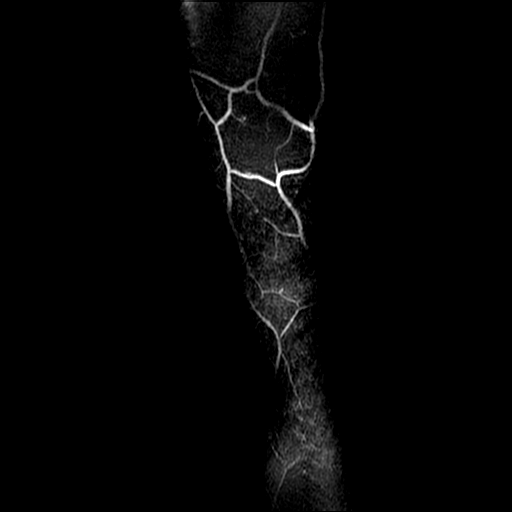

[Series 8: PD fat-sat · coronal · 2.0mm · 0.31mm/px · 3 of 12 slices shown (4 of 4)]
[im 1/12]
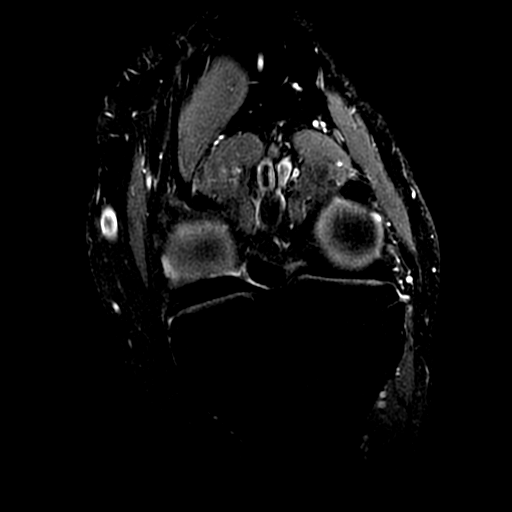
[im 8/12]
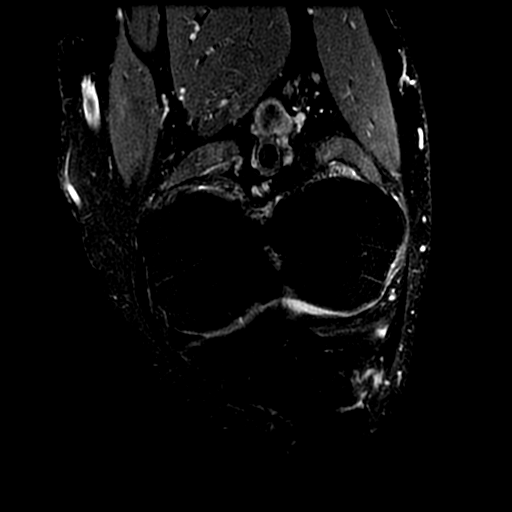
[im 12/12]
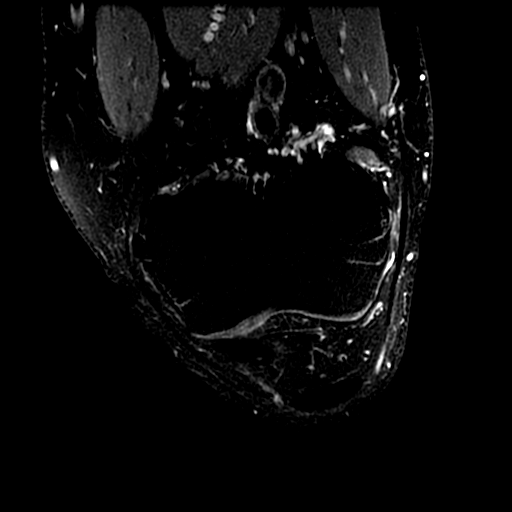

[19 of 40 positions shown; findings below may reference images not displayed]

FINDINGS: MENISCI

Medial meniscus:  Intact with normal morphology.

Lateral meniscus:  Intact with normal morphology.

LIGAMENTS

Cruciates:  Intact.

Collaterals:  Intact.

CARTILAGE

Patellofemoral:  Preserved.

Medial: Mild chondral thinning and surface irregularity over the
medial femoral condyle. There is focal subchondral cyst formation
posteriorly.

Lateral:  Preserved.

MISCELLANEOUS

Joint:  No significant joint effusion.

Popliteal Fossa:  Unremarkable. No significant Baker's cyst.

Extensor Mechanism: There is mild proximal patellar tendinosis. The
quadriceps tendon appears unremarkable.

Bones:  No acute or significant extra-articular osseous findings.

Other: No significant periarticular soft tissue findings.
IMPRESSION: 1. No acute findings or evidence of internal derangement.
2. Mild proximal patellar tendinosis.
3. Mild medial compartment degenerative chondrosis.

## 2018-10-08 NOTE — Telephone Encounter (Signed)
Message sent to provider 

## 2018-10-15 MED FILL — ISOSORBIDE MN ER 30 MG TAB: 30 | 90 days supply | Qty: 90 | Fill #2

## 2018-10-15 MED FILL — TOPIRAMATE 100 MG TABLET: 100 | 90 days supply | Qty: 90 | Fill #3

## 2018-10-21 MED FILL — CITALOPRAM HBR 40 MG TABLET: 40 | 90 days supply | Qty: 90 | Fill #2

## 2018-11-11 MED FILL — ROSUVASTATIN CALCIUM 20 MG: 20 | 90 days supply | Qty: 90 | Fill #0

## 2018-11-12 ENCOUNTER — Other Ambulatory Visit: Payer: Self-pay | Admitting: Pediatric Intensive Care

## 2018-11-19 ENCOUNTER — Other Ambulatory Visit: Payer: Self-pay | Admitting: Pediatric Intensive Care

## 2018-11-19 DIAGNOSIS — Z20822 Contact with and (suspected) exposure to covid-19: Secondary | ICD-10-CM

## 2018-11-19 NOTE — Addendum Note (Signed)
Addended by: Brigitte Pulse on: 11/19/2018 09:40 AM   Modules accepted: Orders

## 2018-11-22 DIAGNOSIS — I10 Essential (primary) hypertension: Secondary | ICD-10-CM | POA: Diagnosis not present

## 2018-11-22 DIAGNOSIS — K219 Gastro-esophageal reflux disease without esophagitis: Secondary | ICD-10-CM | POA: Diagnosis not present

## 2018-11-22 DIAGNOSIS — F325 Major depressive disorder, single episode, in full remission: Secondary | ICD-10-CM | POA: Diagnosis not present

## 2018-11-22 DIAGNOSIS — G43009 Migraine without aura, not intractable, without status migrainosus: Secondary | ICD-10-CM | POA: Diagnosis not present

## 2018-11-25 MED FILL — METOPROLOL SUCCINATE ER 50: 50 | 90 days supply | Qty: 90 | Fill #0

## 2018-11-29 ENCOUNTER — Other Ambulatory Visit: Payer: Self-pay

## 2018-11-29 DIAGNOSIS — Z20822 Contact with and (suspected) exposure to covid-19: Secondary | ICD-10-CM

## 2018-12-02 LAB — NOVEL CORONAVIRUS, NAA: SARS-CoV-2, NAA: NOT DETECTED

## 2018-12-07 ENCOUNTER — Other Ambulatory Visit: Payer: Self-pay | Admitting: *Deleted

## 2018-12-07 DIAGNOSIS — Z20822 Contact with and (suspected) exposure to covid-19: Secondary | ICD-10-CM

## 2018-12-20 ENCOUNTER — Other Ambulatory Visit: Payer: Self-pay

## 2018-12-20 DIAGNOSIS — Z20822 Contact with and (suspected) exposure to covid-19: Secondary | ICD-10-CM

## 2018-12-20 MED FILL — buPROPion HCL ER (XL) 150 M: 150 | 90 days supply | Qty: 90 | Fill #0

## 2018-12-20 NOTE — Addendum Note (Signed)
Addended by: Brigitte Pulse on: 12/20/2018 10:39 AM   Modules accepted: Orders

## 2018-12-30 MED FILL — TOPIRAMATE 200 MG TABLET: 200 | 90 days supply | Qty: 90 | Fill #0

## 2019-01-13 MED FILL — ISOSORBIDE MN ER 30 MG TAB: 30 | 90 days supply | Qty: 90 | Fill #0

## 2019-01-14 DIAGNOSIS — H4423 Degenerative myopia, bilateral: Secondary | ICD-10-CM | POA: Diagnosis not present

## 2019-01-14 DIAGNOSIS — Z9889 Other specified postprocedural states: Secondary | ICD-10-CM | POA: Diagnosis not present

## 2019-01-14 DIAGNOSIS — H35413 Lattice degeneration of retina, bilateral: Secondary | ICD-10-CM | POA: Diagnosis not present

## 2019-01-14 DIAGNOSIS — H3562 Retinal hemorrhage, left eye: Secondary | ICD-10-CM | POA: Diagnosis not present

## 2019-01-14 DIAGNOSIS — H43813 Vitreous degeneration, bilateral: Secondary | ICD-10-CM | POA: Diagnosis not present

## 2019-01-16 ENCOUNTER — Other Ambulatory Visit: Payer: Self-pay

## 2019-01-16 ENCOUNTER — Encounter (INDEPENDENT_AMBULATORY_CARE_PROVIDER_SITE_OTHER): Payer: 59 | Admitting: Ophthalmology

## 2019-01-16 DIAGNOSIS — H33302 Unspecified retinal break, left eye: Secondary | ICD-10-CM | POA: Diagnosis not present

## 2019-01-16 DIAGNOSIS — H2513 Age-related nuclear cataract, bilateral: Secondary | ICD-10-CM

## 2019-01-16 DIAGNOSIS — H35033 Hypertensive retinopathy, bilateral: Secondary | ICD-10-CM

## 2019-01-16 DIAGNOSIS — I10 Essential (primary) hypertension: Secondary | ICD-10-CM | POA: Diagnosis not present

## 2019-01-16 DIAGNOSIS — H35413 Lattice degeneration of retina, bilateral: Secondary | ICD-10-CM

## 2019-01-16 DIAGNOSIS — H4423 Degenerative myopia, bilateral: Secondary | ICD-10-CM | POA: Diagnosis not present

## 2019-01-16 DIAGNOSIS — H43813 Vitreous degeneration, bilateral: Secondary | ICD-10-CM

## 2019-01-16 DIAGNOSIS — H442A2 Degenerative myopia with choroidal neovascularization, left eye: Secondary | ICD-10-CM

## 2019-01-17 ENCOUNTER — Encounter (INDEPENDENT_AMBULATORY_CARE_PROVIDER_SITE_OTHER): Payer: 59 | Admitting: Ophthalmology

## 2019-01-18 MED FILL — CITALOPRAM HBR 40 MG TABLET: 40 | 90 days supply | Qty: 90 | Fill #3

## 2019-01-23 DIAGNOSIS — G4733 Obstructive sleep apnea (adult) (pediatric): Secondary | ICD-10-CM | POA: Diagnosis not present

## 2019-01-24 ENCOUNTER — Other Ambulatory Visit: Payer: Self-pay

## 2019-01-24 ENCOUNTER — Encounter (INDEPENDENT_AMBULATORY_CARE_PROVIDER_SITE_OTHER): Payer: 59 | Admitting: Ophthalmology

## 2019-01-24 DIAGNOSIS — H33302 Unspecified retinal break, left eye: Secondary | ICD-10-CM | POA: Diagnosis not present

## 2019-01-24 DIAGNOSIS — H442A2 Degenerative myopia with choroidal neovascularization, left eye: Secondary | ICD-10-CM | POA: Diagnosis not present

## 2019-01-31 ENCOUNTER — Encounter: Payer: Self-pay | Admitting: Pulmonary Disease

## 2019-01-31 ENCOUNTER — Other Ambulatory Visit: Payer: Self-pay

## 2019-01-31 ENCOUNTER — Ambulatory Visit (INDEPENDENT_AMBULATORY_CARE_PROVIDER_SITE_OTHER): Payer: 59 | Admitting: Pulmonary Disease

## 2019-01-31 VITALS — BP 124/64 | HR 61 | Temp 98.3°F | Ht 69.0 in | Wt 178.6 lb

## 2019-01-31 DIAGNOSIS — G4733 Obstructive sleep apnea (adult) (pediatric): Secondary | ICD-10-CM

## 2019-01-31 DIAGNOSIS — Z9989 Dependence on other enabling machines and devices: Secondary | ICD-10-CM

## 2019-01-31 NOTE — Progress Notes (Addendum)
Re-establish Care Office Visit  Subjective:  Patient ID: [name], male    DOB: 26-Apr-1971  Age: 48 y.o. MRN: [id number]  CC:  Chief Complaint  Patient presents with  . Consult    former Dr Elsworth Soho pt, fit bit shows O2 dropping in 80's at night, using cpap nightly, feeling more tired during day, DME [employer]    HPI [name] presents for reevaluation of his chronic central sleep apnea. He has remained compliant on his CPAP device and reports no complications or issues with this. His primary concern today is regarding increasing daytime sleepiness and headaches. He has required up-titration of his Topamax for his headaches but hey have otherwise remained stable. In addition, he has experienced increased sleepiness, going to bed earlier and waking later despite compliance with his CPAP. He notes additionally that his Garmin watch with pulse oximetry capability denotes night time oxygen desaturation to the 80's during REM sleep. Given the cumulation of information provided he wonders if his central sleep apnea may be progressing.   Compliance report: Usage 100% >4 hrs 100% Average usage 7 hrs 38 min Mode CPAP Pressure 8cmH2O EPR fulltime AHI: 1.3 Leaks: Median 0.1 with maximum 7.8    Past Medical History:  Diagnosis Date  . Asthma   . Coronary artery disease CARDIOLOGIST - DR Daneen Schick--- LAST VISIT OCT 2012   DENIES S & S  . Depression   . Dysuria   . GERD (gastroesophageal reflux disease)   . H/O hiatal hernia   . History of angina pectoris   . Hypertension   . Kidney calculi   . Left ureteral calculus   . Myocardial bridge MID LAD  . Shortness of breath    with excertion    Past Surgical History:  Procedure Laterality Date  . CARDIAC CATHETERIZATION  01-07-2010-- DR Daneen Schick   NORMAL LVF/ PATENT CORONARY ARTERIES/ MYOCARDIAL BRIDGE MID LAD  . CYSTOSCOPY W/ URETERAL STENT PLACEMENT  08/15/2011   Procedure: CYSTOSCOPY WITH STENT REPLACEMENT;  Surgeon:  Fredricka Bonine, MD;  Location: [facility];  Service: Urology;  Laterality: Left;  stone basket extraction left, left ureteroscopy  . EXTRACORPOREAL SHOCK WAVE LITHOTRIPSY  08-07-11   LEFT  . VERICOCELECTOMY  1992    Family History  Problem Relation Age of Onset  . Emphysema Paternal Grandfather   . Aortic aneurysm Paternal Grandfather   . Heart attack Brother   . Cancer - Lung Father   . Hypertension Mother   . Hypertension Paternal Grandmother   . Alzheimer's disease Maternal Grandmother   . Stroke Maternal Grandfather     Social History   Socioeconomic History  . Marital status: Married    Spouse name: Not on file  . Number of children: 0  . Years of education: Not on file  . Highest education level: Not on file  Occupational History  . Not on file  Social Needs  . Financial resource strain: Not on file  . Food insecurity    Worry: Not on file    Inability: Not on file  . Transportation needs    Medical: Not on file    Non-medical: Not on file  Tobacco Use  . Smoking status: Former Smoker    Packs/day: 3.00    Years: 10.00    Pack years: 30.00    Types: Cigarettes, Cigars    Quit date: 08/13/2005    Years since quitting: 13.4  . Smokeless tobacco: Former Network engineer  and Sexual Activity  . Alcohol use: No    Alcohol/week: 0.0 standard drinks  . Drug use: No  . Sexual activity: Not on file  Lifestyle  . Physical activity    Days per week: Not on file    Minutes per session: Not on file  . Stress: Not on file  Relationships  . Social Herbalist on phone: Not on file    Gets together: Not on file    Attends religious service: Not on file    Active member of club or organization: Not on file    Attends meetings of clubs or organizations: Not on file    Relationship status: Not on file  . Intimate partner violence    Fear of current or ex partner: Not on file    Emotionally abused: Not on file    Physically abused: Not  on file    Forced sexual activity: Not on file  Other Topics Concern  . Not on file  Social History Narrative  . Not on file    Outpatient Medications Prior to Visit  Medication Sig Dispense Refill  . citalopram (CELEXA) 40 MG tablet Take 40 mg by mouth daily.    . clobetasol cream (TEMOVATE) 3.81 % Apply 1 application topically 2 (two) times daily. (Patient taking differently: Apply 1 application topically 2 (two) times daily. As needed) 30 g 0  . glucosamine-chondroitin 500-400 MG tablet Take 1 tablet by mouth 3 (three) times daily.    Marland Kitchen ibuprofen (ADVIL,MOTRIN) 800 MG tablet Take 1 tablet (800 mg total) by mouth 3 (three) times daily. (Patient taking differently: Take 800 mg by mouth 3 (three) times daily. As needed) 21 tablet 0  . isosorbide mononitrate (IMDUR) 30 MG 24 hr tablet Take 1 tablet (30 mg total) by mouth daily. 90 tablet 3  . metoprolol succinate (TOPROL-XL) 50 MG 24 hr tablet Take 1 tablet (50 mg total) by mouth daily. 90 tablet 0  . Multiple Vitamin (MULTIVITAMIN) tablet Take 1 tablet by mouth daily.    . nitroGLYCERIN (NITROLINGUAL) 0.4 MG/SPRAY spray Place 1 spray under the tongue every 5 (five) minutes x 3 doses as needed for chest pain. 12 g 3  . rosuvastatin (CRESTOR) 20 MG tablet Take 1 tablet (20 mg total) by mouth daily. 90 tablet 3  . SUMAtriptan (IMITREX) 25 MG tablet Take 1 tablet (25 mg total) by mouth every 2 (two) hours as needed for migraine. May repeat in 2 hours if headache persists or recurs. 10 tablet 6  . aspirin 81 MG EC tablet Take 81 mg by mouth daily. Swallow whole.    Marland Kitchen aspirin-acetaminophen-caffeine (EXCEDRIN MIGRAINE) 250-250-65 MG tablet Take by mouth every 6 (six) hours as needed for headache.    . citalopram (CELEXA) 20 MG tablet Take 1 tablet (20 mg total) by mouth daily. Reported on 10/12/2015 (Patient taking differently: Take 20 mg by mouth daily. ) 90 tablet 1  . omeprazole (PRILOSEC) 40 MG capsule Take 1 capsule (40 mg total) by mouth daily.  90 capsule 2  . oseltamivir (TAMIFLU) 75 MG capsule Take 1 capsule (75 mg total) by mouth 2 (two) times daily. 10 capsule 0   No facility-administered medications prior to visit.     Allergies  Allergen Reactions  . Ansaid [Ocufen] Nausea And Vomiting  . Chantix [Varenicline Tartrate] Other (See Comments)    Gi intolerances   . Codeine Other (See Comments)    Gi intolerance   . Erythromycin Other (  See Comments)    Gi intolerances     ROS Review of Systems  Constitutional: Negative for activity change, fatigue and unexpected weight change.  HENT: Negative for congestion and sinus pain.   Eyes: Negative for discharge and redness.  Respiratory: Positive for shortness of breath (Rarely). Negative for apnea and chest tightness.   Cardiovascular: Negative for chest pain and palpitations.  Gastrointestinal: Negative for abdominal distention and abdominal pain.  Endocrine: Negative for polyuria.  Genitourinary: Negative for dysuria and urgency.  Musculoskeletal: Negative for back pain and myalgias.  Skin: Negative for rash and wound.  Neurological: Positive for headaches. Negative for dizziness, weakness and light-headedness.  Psychiatric/Behavioral: Negative for confusion and sleep disturbance.      Objective:    Physical Exam  Constitutional: He is oriented to person, place, and time. He appears well-developed and well-nourished. No distress.  HENT:  Head: Normocephalic and atraumatic.  Eyes: Pupils are equal, round, and reactive to light. EOM are normal. No scleral icterus.  Cardiovascular: Normal rate and regular rhythm.  No murmur heard. Pulmonary/Chest: Effort normal and breath sounds normal. No respiratory distress. He has no wheezes.  Abdominal: Soft. He exhibits no distension.  Musculoskeletal:        General: No deformity or edema.  Neurological: He is alert and oriented to person, place, and time.  Skin: Skin is warm and dry. He is not diaphoretic.  Psychiatric:  His behavior is normal. Thought content normal.    BP 124/64 (BP Location: Right Arm, Cuff Size: Normal)   Pulse 61   Temp 98.3 F (36.8 C)   Ht 5' 9"  (1.753 m)   Wt 178 lb 9.6 oz (81 kg)   SpO2 96%   BMI 26.37 kg/m  Wt Readings from Last 3 Encounters:  01/31/19 178 lb 9.6 oz (81 kg)  07/09/17 176 lb 6.4 oz (80 kg)  03/29/17 173 lb 12.8 oz (78.8 kg)     Health Maintenance Due  Topic Date Due  . HIV Screening  03/02/1986    There are no preventive care reminders to display for this patient.  Lab Results  Component Value Date   TSH 1.81 01/05/2017   Lab Results  Component Value Date   WBC 6.3 05/28/2018   HGB 16.0 05/28/2018   HCT 45.5 05/28/2018   MCV 88.9 05/28/2018   PLT 235 05/28/2018   Lab Results  Component Value Date   NA 137 01/05/2017   K 3.9 01/05/2017   CO2 21 01/05/2017   GLUCOSE 82 01/05/2017   BUN 19 01/05/2017   CREATININE 1.32 01/05/2017   BILITOT 0.4 04/17/2017   ALKPHOS 54 04/17/2017   AST 19 04/17/2017   ALT 19 04/17/2017   PROT 7.5 04/17/2017   ALBUMIN 5.0 04/17/2017   CALCIUM 10.0 01/05/2017   Lab Results  Component Value Date   CHOL 151 04/17/2017   Lab Results  Component Value Date   HDL 51 04/17/2017   Lab Results  Component Value Date   LDLCALC 81 04/17/2017   Lab Results  Component Value Date   TRIG 93 04/17/2017   Lab Results  Component Value Date   CHOLHDL 3.0 04/17/2017   Lab Results  Component Value Date   HGBA1C  01/06/2010    5.2 (NOTE)  According to the ADA Clinical Practice Recommendations for 2011, when HbA1c is used as a screening test:   >=6.5%   Diagnostic of Diabetes Mellitus           (if abnormal result  is confirmed)  5.7-6.4%   Increased risk of developing Diabetes Mellitus  References:Diagnosis and Classification of Diabetes Mellitus,Diabetes URKY,7062,37(SEGBT 1):S62-S69 and Standards of Medical Care in         Diabetes -  2011,Diabetes DVVO,1607,37  (Suppl 1):S11-S61.      Assessment & Plan:   Problem List Items Addressed This Visit      Respiratory   OSA on CPAP - Primary    THE patients CPAP compliance report indicates good adherence to therapy. I do not see a major concern with this report. His AHI is within range at 1.3, his usage is 100% at >7 hours and the leak is minimal. As he is experiencing increased symptoms a titration study is warranted to better determine if optimal pressure/assistance is being provided given the time since his last study.   Titration study ordered      Relevant Orders   Cpap titration      No orders of the defined types were placed in this encounter.   Follow-up: No follow-ups on file.    Kathi Ludwig, MD   Attending note: I have seen and examined the patient. History, labs and imaging reviewed. Agree with assessment and plan  48 year old with sleep apnea.  Has not followed up since 2017.  States that he has noticed increasing daytime somnolence with fatigue.  His smart watch indicates that his O2 sats are dropping at night He will need an in lab repeat sleep study with repeat titration of CPAP pressure and possibly supplemental oxygen Follow-up with Dr. Elsworth Soho after completion of study.  Marshell Garfinkel MD [facility] 01/31/2019, 5:05 PM

## 2019-01-31 NOTE — Patient Instructions (Signed)
Thank you for your visit to the [facility] clinic. It was a pleasure meeting you. With regard to your daytime sleepiness and increased headaches we feel you warrant a titration sleep study to better assess the parameters of you CPAP device and function. We will schedule this for you. You will need to follow-up with Dr. Elsworth Soho following the study to discuss the findings and need for any further evaluation.   In the meantime please continue to use your current device and settings.

## 2019-01-31 NOTE — Assessment & Plan Note (Signed)
THE patients CPAP compliance report indicates good adherence to therapy. I do not see a major concern with this report. His AHI is within range at 1.3, his usage is 100% at >7 hours and the leak is minimal. As he is experiencing increased symptoms a titration study is warranted to better determine if optimal pressure/assistance is being provided given the time since his last study.   Titration study ordered

## 2019-02-03 MED FILL — ROSUVASTATIN CALCIUM 20 MG: 20 | 90 days supply | Qty: 90 | Fill #0

## 2019-02-06 ENCOUNTER — Other Ambulatory Visit (HOSPITAL_COMMUNITY)
Admission: RE | Admit: 2019-02-06 | Discharge: 2019-02-06 | Disposition: A | Discharge status: Home / Self Care | Payer: 59 | Source: Ambulatory Visit | Attending: Pulmonary Disease | Admitting: Pulmonary Disease

## 2019-02-06 DIAGNOSIS — Z20828 Contact with and (suspected) exposure to other viral communicable diseases: Secondary | ICD-10-CM | POA: Diagnosis not present

## 2019-02-06 DIAGNOSIS — Z01812 Encounter for preprocedural laboratory examination: Secondary | ICD-10-CM | POA: Diagnosis not present

## 2019-02-06 LAB — SARS CORONAVIRUS 2 (TAT 6-24 HRS): SARS Coronavirus 2: NEGATIVE

## 2019-02-08 ENCOUNTER — Other Ambulatory Visit: Payer: Self-pay

## 2019-02-08 ENCOUNTER — Ambulatory Visit (HOSPITAL_BASED_OUTPATIENT_CLINIC_OR_DEPARTMENT_OTHER): Payer: 59 | Attending: Pulmonary Disease | Admitting: Pulmonary Disease

## 2019-02-08 DIAGNOSIS — G4733 Obstructive sleep apnea (adult) (pediatric): Secondary | ICD-10-CM | POA: Diagnosis not present

## 2019-02-08 DIAGNOSIS — Z9989 Dependence on other enabling machines and devices: Secondary | ICD-10-CM | POA: Diagnosis not present

## 2019-02-10 ENCOUNTER — Other Ambulatory Visit: Payer: Self-pay

## 2019-02-11 DIAGNOSIS — G4733 Obstructive sleep apnea (adult) (pediatric): Secondary | ICD-10-CM | POA: Diagnosis not present

## 2019-02-11 DIAGNOSIS — Z9989 Dependence on other enabling machines and devices: Secondary | ICD-10-CM | POA: Diagnosis not present

## 2019-02-11 NOTE — Procedures (Signed)
    Patient Name: [name], [place] Date: 02/08/2019 Gender: Male D.O.B: 11-Sep-1970 Age (years): 67 Referring Provider: Marshell Garfinkel Height (inches): 33 Interpreting Physician: Chesley Mires MD, ABSM Weight (lbs): 170 RPSGT: [place], [place] BMI: 25 MRN: [id number] Neck Size: 16.50  CLINICAL INFORMATION 48 year old with history of obstructive sleep apnea on CPAP therapy with persistent daytime sleepiness and headaches.  He presents for a CPAP titration study.  SLEEP STUDY TECHNIQUE As per the AASM Manual for the Scoring of Sleep and Associated Events v2.3 (April 2016) with a hypopnea requiring 4% desaturations.  The channels recorded and monitored were frontal, central and occipital EEG, electrooculogram (EOG), submentalis EMG (chin), nasal and oral airflow, thoracic and abdominal wall motion, anterior tibialis EMG, snore microphone, electrocardiogram, and pulse oximetry. Continuous positive airway pressure (CPAP) was initiated at the beginning of the study and titrated to treat sleep-disordered breathing.  MEDICATIONS Medications self-administered by patient taken the night of the study : N/A  TECHNICIAN COMMENTS Comments added by technician: Patient had difficulty initiating sleep. The patient was cold during the first part of study. Comments added by scorer: N/A  RESPIRATORY PARAMETERS Optimal PAP Pressure (cm): 8 AHI at Optimal Pressure (/hr): 1.0 Overall Minimal O2 (%): 93.0 Supine % at Optimal Pressure (%): 64 Minimal O2 at Optimal Pressure (%): 94.0   SLEEP ARCHITECTURE The study was initiated at 10:47:38 PM and ended at 4:59:44 AM.  Sleep onset time was 21.5 minutes and the sleep efficiency was 72.0%%. The total sleep time was 268 minutes.  The patient spent 13.4%% of the night in stage N1 sleep, 44.0%% in stage N2 sleep, 17.7%% in stage N3 and 24.8% in REM.Stage REM latency was 195.5 minutes  Wake after sleep onset was 82.6. Alpha intrusion was absent.  Supine sleep was 40.71%.  CARDIAC DATA The 2 lead EKG demonstrated sinus rhythm. The mean heart rate was 52.8 beats per minute. Other EKG findings include: None.  LEG MOVEMENT DATA The total Periodic Limb Movements of Sleep (PLMS) were 0. The PLMS index was 0.0. A PLMS index of <15 is considered normal in adults.  IMPRESSIONS - He did well CPAP 8 cm H2O. - He did not require supplemental oxygen during this study.  DIAGNOSIS - Obstructive Sleep Apnea (327.23 [G47.33 ICD-10])  RECOMMENDATIONS - Continue CPAP therapy on 8 cm H2O. - He was fitted with a Small size Resmed Full Face Mask AirFit F10 mask and heated humidification.  [Electronically signed] 02/11/2019 04:41 PM  Chesley Mires MD, [place], American Board of Sleep Medicine   NPI: [id number]

## 2019-02-13 ENCOUNTER — Telehealth: Payer: Self-pay | Admitting: Pulmonary Disease

## 2019-02-13 NOTE — Telephone Encounter (Signed)
Dr. Elsworth Soho can you please advise on sleep study? I don't see the results posted.

## 2019-02-14 NOTE — Telephone Encounter (Signed)
LMTCB x1 for pt.  

## 2019-02-14 NOTE — Telephone Encounter (Signed)
Sleep study was read by Dr. Halford Chessman on 8/29 Events are well controlled on CPAP 8 cm, there was no significant desaturation, lowest was 93% I also reviewed his download from his visit with Dr. Vaughan Browner which shows excellent control of events. No changes recommended to his CPAP settings

## 2019-02-18 MED FILL — METOPROLOL SUCCINATE ER 50: 50 | 90 days supply | Qty: 90 | Fill #0

## 2019-02-18 NOTE — Telephone Encounter (Signed)
Spoke with patient. He is aware of his sleep study results and verbalized understanding. However, he wanted to know what else he could do in regards to his increased daytime sleepiness.   RA, please advise. Thanks!

## 2019-02-18 NOTE — Telephone Encounter (Signed)
The patient was called and advised of the results based on telephone note dated 02/18/19. Awaiting response to additional patient question from Dr. Elsworth Soho. Nothing further needed at this time.

## 2019-02-18 NOTE — Telephone Encounter (Signed)
Spoke with pt and advised message from Dr. Elsworth Soho. Pt understood and will await your call.

## 2019-02-18 NOTE — Telephone Encounter (Signed)
There are stimulants such as Provigil that we can consider. However his sleep apnea was very mild so I really do not think he needs this. I will call him at some point to discuss

## 2019-02-25 NOTE — Telephone Encounter (Signed)
atc patient, looks like Dr. Elsworth Soho already spoke with patient. I left message to call us back if there was any further needs. Otherwise going to close out this message.

## 2019-02-25 NOTE — Telephone Encounter (Signed)
Discussed with him. Only advised at this point would be to take propafenone around 1 PM if he has a sitdown meeting in the afternoon otherwise just observe and see how symptoms progress

## 2019-03-06 ENCOUNTER — Other Ambulatory Visit: Payer: Self-pay

## 2019-03-06 ENCOUNTER — Encounter (INDEPENDENT_AMBULATORY_CARE_PROVIDER_SITE_OTHER): Payer: 59 | Admitting: Ophthalmology

## 2019-03-06 DIAGNOSIS — H35033 Hypertensive retinopathy, bilateral: Secondary | ICD-10-CM

## 2019-03-06 DIAGNOSIS — I10 Essential (primary) hypertension: Secondary | ICD-10-CM | POA: Diagnosis not present

## 2019-03-06 DIAGNOSIS — H33303 Unspecified retinal break, bilateral: Secondary | ICD-10-CM

## 2019-03-06 DIAGNOSIS — H43813 Vitreous degeneration, bilateral: Secondary | ICD-10-CM

## 2019-03-06 DIAGNOSIS — H442A2 Degenerative myopia with choroidal neovascularization, left eye: Secondary | ICD-10-CM | POA: Diagnosis not present

## 2019-03-06 DIAGNOSIS — H4421 Degenerative myopia, right eye: Secondary | ICD-10-CM | POA: Diagnosis not present

## 2019-03-06 DIAGNOSIS — H2512 Age-related nuclear cataract, left eye: Secondary | ICD-10-CM | POA: Diagnosis not present

## 2019-03-07 ENCOUNTER — Encounter (INDEPENDENT_AMBULATORY_CARE_PROVIDER_SITE_OTHER): Payer: 59 | Admitting: Ophthalmology

## 2019-03-07 MED FILL — BESIVANCE 0.6% SUSP: 0.6 | 18 days supply | Qty: 5 | Fill #2

## 2019-03-15 MED FILL — buPROPion HCL ER (XL) 150 M: 150 | 90 days supply | Qty: 90 | Fill #1

## 2019-04-02 ENCOUNTER — Encounter (INDEPENDENT_AMBULATORY_CARE_PROVIDER_SITE_OTHER): Payer: 59 | Admitting: Ophthalmology

## 2019-04-02 DIAGNOSIS — H442A2 Degenerative myopia with choroidal neovascularization, left eye: Secondary | ICD-10-CM

## 2019-04-02 DIAGNOSIS — H33303 Unspecified retinal break, bilateral: Secondary | ICD-10-CM

## 2019-04-02 DIAGNOSIS — I10 Essential (primary) hypertension: Secondary | ICD-10-CM

## 2019-04-02 DIAGNOSIS — H4423 Degenerative myopia, bilateral: Secondary | ICD-10-CM

## 2019-04-02 DIAGNOSIS — H2512 Age-related nuclear cataract, left eye: Secondary | ICD-10-CM

## 2019-04-02 DIAGNOSIS — H43813 Vitreous degeneration, bilateral: Secondary | ICD-10-CM | POA: Diagnosis not present

## 2019-04-02 DIAGNOSIS — H35033 Hypertensive retinopathy, bilateral: Secondary | ICD-10-CM | POA: Diagnosis not present

## 2019-04-07 MED FILL — ISOSORBIDE MN ER 30 MG TAB: 30 | 90 days supply | Qty: 90 | Fill #0

## 2019-04-17 DIAGNOSIS — G4733 Obstructive sleep apnea (adult) (pediatric): Secondary | ICD-10-CM

## 2019-04-17 NOTE — Telephone Encounter (Signed)
Received a MyChart message from patient. He wanted to know if Dr. Vaughan Browner would be willing to send in a cpap supplies prescription for him. He attempted to get supplies from [facility] but they told him he needed a new prescription.   Dr. Vaughan Browner, please advise if you are ok with [place] sending in a new prescription. Thanks!

## 2019-04-18 DIAGNOSIS — G4733 Obstructive sleep apnea (adult) (pediatric): Secondary | ICD-10-CM | POA: Diagnosis not present

## 2019-04-18 MED FILL — CITALOPRAM HBR 40 MG TABLET: 40 | 90 days supply | Qty: 90 | Fill #0

## 2019-04-18 NOTE — Telephone Encounter (Signed)
Yes. OK to send the CPAP order. Thanks

## 2019-04-18 NOTE — Telephone Encounter (Signed)
Rx for CPAP supplies sent.

## 2019-04-28 MED FILL — ROSUVASTATIN CALCIUM 20 MG: 20 | 90 days supply | Qty: 90 | Fill #1

## 2019-05-02 ENCOUNTER — Encounter (INDEPENDENT_AMBULATORY_CARE_PROVIDER_SITE_OTHER): Payer: 59 | Admitting: Ophthalmology

## 2019-05-02 DIAGNOSIS — H442A2 Degenerative myopia with choroidal neovascularization, left eye: Secondary | ICD-10-CM | POA: Diagnosis not present

## 2019-05-02 DIAGNOSIS — H35033 Hypertensive retinopathy, bilateral: Secondary | ICD-10-CM | POA: Diagnosis not present

## 2019-05-02 DIAGNOSIS — H33303 Unspecified retinal break, bilateral: Secondary | ICD-10-CM | POA: Diagnosis not present

## 2019-05-02 DIAGNOSIS — I10 Essential (primary) hypertension: Secondary | ICD-10-CM | POA: Diagnosis not present

## 2019-05-02 DIAGNOSIS — H2512 Age-related nuclear cataract, left eye: Secondary | ICD-10-CM

## 2019-05-02 DIAGNOSIS — H4423 Degenerative myopia, bilateral: Secondary | ICD-10-CM | POA: Diagnosis not present

## 2019-05-02 DIAGNOSIS — H43813 Vitreous degeneration, bilateral: Secondary | ICD-10-CM

## 2019-05-14 MED FILL — METOPROLOL SUCCINATE ER 50: 50 | 90 days supply | Qty: 90 | Fill #0

## 2019-05-30 ENCOUNTER — Encounter (INDEPENDENT_AMBULATORY_CARE_PROVIDER_SITE_OTHER): Payer: 59 | Admitting: Ophthalmology

## 2019-05-30 ENCOUNTER — Other Ambulatory Visit: Payer: Self-pay

## 2019-05-30 DIAGNOSIS — H33303 Unspecified retinal break, bilateral: Secondary | ICD-10-CM

## 2019-05-30 DIAGNOSIS — H4423 Degenerative myopia, bilateral: Secondary | ICD-10-CM

## 2019-05-30 DIAGNOSIS — H43813 Vitreous degeneration, bilateral: Secondary | ICD-10-CM | POA: Diagnosis not present

## 2019-05-30 DIAGNOSIS — I10 Essential (primary) hypertension: Secondary | ICD-10-CM

## 2019-05-30 DIAGNOSIS — H442A2 Degenerative myopia with choroidal neovascularization, left eye: Secondary | ICD-10-CM

## 2019-05-30 DIAGNOSIS — H35033 Hypertensive retinopathy, bilateral: Secondary | ICD-10-CM

## 2019-06-14 MED FILL — BUPROPION HCL XL 150 MG TAB: 150 | 90 days supply | Qty: 90 | Fill #2

## 2019-06-30 MED FILL — ISOSORBIDE MN ER 30 MG TAB: 30 | 90 days supply | Qty: 90 | Fill #1

## 2019-07-09 ENCOUNTER — Encounter (INDEPENDENT_AMBULATORY_CARE_PROVIDER_SITE_OTHER): Payer: 59 | Admitting: Ophthalmology

## 2019-07-09 DIAGNOSIS — H442A2 Degenerative myopia with choroidal neovascularization, left eye: Secondary | ICD-10-CM | POA: Diagnosis not present

## 2019-07-09 DIAGNOSIS — H35033 Hypertensive retinopathy, bilateral: Secondary | ICD-10-CM

## 2019-07-09 DIAGNOSIS — H33303 Unspecified retinal break, bilateral: Secondary | ICD-10-CM | POA: Diagnosis not present

## 2019-07-09 DIAGNOSIS — H4423 Degenerative myopia, bilateral: Secondary | ICD-10-CM | POA: Diagnosis not present

## 2019-07-09 DIAGNOSIS — H43813 Vitreous degeneration, bilateral: Secondary | ICD-10-CM | POA: Diagnosis not present

## 2019-07-09 DIAGNOSIS — I10 Essential (primary) hypertension: Secondary | ICD-10-CM | POA: Diagnosis not present

## 2019-07-11 MED FILL — CITALOPRAM HBR 40 MG TABLET: 40 | 90 days supply | Qty: 90 | Fill #0

## 2019-07-16 DIAGNOSIS — G4733 Obstructive sleep apnea (adult) (pediatric): Secondary | ICD-10-CM | POA: Diagnosis not present

## 2019-07-25 ENCOUNTER — Other Ambulatory Visit: Payer: Self-pay | Admitting: Physician Assistant

## 2019-07-25 ENCOUNTER — Telehealth: Payer: Self-pay | Admitting: Interventional Cardiology

## 2019-07-25 ENCOUNTER — Other Ambulatory Visit: Payer: Self-pay | Admitting: Interventional Cardiology

## 2019-07-25 MED ORDER — NITROGLYCERIN 0.4 MG SL SUBL: 0.4000 mg | SUBLINGUAL_TABLET | SUBLINGUAL | 0 refills | Status: DC | PRN

## 2019-07-25 MED ORDER — NITROGLYCERIN 0.4 MG/SPRAY TL SOLN: 1.0000 | 1 refills | Status: DC | PRN

## 2019-07-25 MED ORDER — NITROGLYCERIN 0.4 MG/SPRAY TL SOLN: 1.0000 | 0 refills | Status: DC | PRN

## 2019-07-25 MED FILL — NITROGLYCERIN 0.4 MG TAB SL: 0.4 | 8 days supply | Qty: 25 | Fill #0

## 2019-07-25 MED FILL — NITROGLYCERIN LINGUAL 0.4 M: 0.4 | 30 days supply | Qty: 12 | Fill #0

## 2019-07-25 NOTE — Telephone Encounter (Signed)
Pt's medication was sent to pt's pharmacy as requested. Confirmation received.  °

## 2019-07-25 NOTE — Telephone Encounter (Signed)
The patient called because he has had intermittent CP over the past 2-3 weeks and was out of Nitro spray so he reached out to [facility] to get Nitro tablets, which were ordered by [facility].   The patient was scheduled with Cecille Rubin on 2/23, but I was able to move up to 2/17 to further evaluate.

## 2019-07-25 NOTE — Telephone Encounter (Signed)
*  STAT* If patient is at the pharmacy, call can be transferred to refill team.   1. Which medications need to be refilled? (please list name of each medication and dose if known)  nitroGLYCERIN (NITROLINGUAL) 0.4 MG/SPRAY spray  2. Which pharmacy/location (including street and city if local pharmacy) is medication to be sent to? [place], [place]  3. Do they need a 30 day or 90 day supply? 90 day  Patient is currently out of medication.

## 2019-07-25 NOTE — Telephone Encounter (Signed)
Pt c/o of Chest Pain: STAT if CP now or developed within 24 hours  1. Are you having CP right now? Yes   2. Are you experiencing any other symptoms (ex. SOB, nausea, vomiting, sweating)? No   3. How long have you been experiencing CP? Off and On for last 2-3 weeks   4. Is your CP continuous or coming and going? Coming and going   5. Have you taken Nitroglycerin? No  Patient is calling stating he is experiencing CP. He states it has been on and off for 2-3 weeks, but has been persistent today. He has not taken any Nitroglycerin and he is currently out of the medication. I have sent over a refill for the spray form, but he states he reached out to the pharmacy and they will not have the spray in stock until Monday. Patient is requesting the pill form of Nitroglycerin be sent over to his pharmacy so he can pick it up today to last him through Monday. Please advise.    ?

## 2019-07-25 NOTE — Telephone Encounter (Signed)
lpmtcb 2/12

## 2019-07-25 NOTE — Progress Notes (Signed)
CARDIOLOGY OFFICE NOTE  Date:  07/30/2019    [name] Date of Birth: 1971-02-13 Medical Record #[id number]  PCP:  Kathyrn Lass, MD  Cardiologist:  Tamala Julian  Chief Complaint  Patient presents with  . Follow-up    Seen for Dr. Tamala Julian    History of Present Illness: [name] is a 49 y.o. male who presents today for a work in visit. Seen for Dr. Tamala Julian.   He was last seen here in October of 2018.   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 Also with OSA on CPAP. He had cardiac CT with morphology and FFR in 2018 that did not reveal obstruction but coronary calcification was noted (see below). Dr. 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   Phone call last week - "Patient is calling stating he is experiencing CP. He states it has been on and off for 2-3 weeks, but has been persistent today. He has not taken any Nitroglycerin and he is currently out of the medication. I have sent over a refill for the spray form, but he states he reached out to the pharmacy and they will not have the spray in stock until Monday. Patient is requesting the pill form of Nitroglycerin be sent over to his pharmacy so he can pick it up today to last him through Monday. Please advise."  Thus added to my schedule for today.   The patient does not have symptoms concerning for COVID-19 infection (fever, chills, cough, or new shortness of breath).   Comes in today. Here alone. He notes that he has basically done ok since his last visit here in 2018. He would intermittently have his "usual chest pain". 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 He is working 7 days a week. He has gained weight - not exercising as much - weighing  about 200# at home. About 2 weeks ago - he started having recurrent chest pain - his NTG had expired - he reached out to [facility] (since they are working the infusion clinics) to get this refilled. Last Friday he used a total of 3 NTG over the course of almost 2 hours - used 2 on Saturday - and none since. These spells of chest pain were at rest - which was unusual for him - he could also "feel his murmur" like he typically would with overexertion. He has been fine this week. He has had no labs. He has been taking his other medicines. Unclear what the trigger may have been - ? Stress with all his responsibilities.   Past Medical History:  Diagnosis Date  . Asthma   . Coronary artery disease CARDIOLOGIST - DR Daneen Schick--- LAST VISIT OCT 2012   DENIES S & S  . Depression   . Dysuria   . GERD (gastroesophageal reflux disease)   . H/O hiatal hernia   . History of angina pectoris   . Hypertension   . Kidney calculi   . Left ureteral calculus   . Myocardial bridge MID LAD  . Shortness of breath    with excertion    Past Surgical History:  Procedure Laterality Date  . CARDIAC CATHETERIZATION  01-07-2010-- DR Daneen Schick   NORMAL LVF/ PATENT CORONARY ARTERIES/ MYOCARDIAL BRIDGE MID LAD  .  CYSTOSCOPY W/ URETERAL STENT PLACEMENT  08/15/2011   Procedure: CYSTOSCOPY WITH STENT REPLACEMENT;  Surgeon: Fredricka Bonine, MD;  Location: [facility];  Service: Urology;  Laterality: Left;  stone basket extraction left, left ureteroscopy  . EXTRACORPOREAL SHOCK WAVE LITHOTRIPSY  08-07-11   LEFT  . VERICOCELECTOMY  1992     Medications: Current Meds  Medication Sig  . buPROPion (WELLBUTRIN XL) 150 MG 24 hr tablet Take 150 mg by mouth daily.  . citalopram (CELEXA) 40 MG tablet Take 40 mg by mouth daily.  . clobetasol cream (TEMOVATE) 7.65 % Apply 1 application topically as needed.  Marland Kitchen glucosamine-chondroitin 500-400 MG tablet Take 1 tablet by mouth 3 (three) times daily.  Marland Kitchen  ibuprofen (ADVIL) 800 MG tablet Take 800 mg by mouth as needed.  . metoprolol succinate (TOPROL-XL) 50 MG 24 hr tablet Take 1 tablet (50 mg total) by mouth daily.  . Multiple Vitamin (MULTIVITAMIN) tablet Take 1 tablet by mouth daily.  . nitroGLYCERIN (NITROLINGUAL) 0.4 MG/SPRAY spray Place 1 spray under the tongue every 5 (five) minutes x 3 doses as needed for chest pain. Pt must keep upcoming appt before anymore refills. Final Attempt  . nitroGLYCERIN (NITROSTAT) 0.4 MG SL tablet Place 1 tablet (0.4 mg total) under the tongue every 5 (five) minutes as needed for chest pain.  . SUMAtriptan (IMITREX) 25 MG tablet Take 1 tablet (25 mg total) by mouth every 2 (two) hours as needed for migraine. May repeat in 2 hours if headache persists or recurs.  . [DISCONTINUED] isosorbide mononitrate (IMDUR) 30 MG 24 hr tablet Take 1 tablet (30 mg total) by mouth daily.     Allergies: Allergies  Allergen Reactions  . Ansaid [Ocufen] Nausea And Vomiting  . Chantix [Varenicline Tartrate] Other (See Comments)    Gi intolerances   . Codeine Other (See Comments)    Gi intolerance   . Erythromycin Other (See Comments)    Gi intolerances     Social History: The patient  reports that he quit smoking about 13 years ago. His smoking use included cigarettes and cigars. He has a 30.00 pack-year smoking history. He has quit using smokeless tobacco. He reports that he does not drink alcohol or use drugs.   Family History: The patient's family history includes Alzheimer's disease in his maternal grandmother; Aortic aneurysm in his paternal grandfather; Cancer - Lung in his father; Emphysema in his paternal grandfather; Heart attack in his brother; Hypertension in his mother and paternal grandmother; Stroke in his maternal grandfather.   Review of Systems: Please see the history of present illness.   All other systems are reviewed and negative.   Physical Exam: VS:  BP 122/78   Pulse 70   Ht 5' 9"  (1.753 m)    Wt 211 lb (95.7 kg)   SpO2 96%   BMI 31.16 kg/m  .  BMI Body mass index is 31.16 kg/m.  Wt Readings from Last 3 Encounters:  07/30/19 211 lb (95.7 kg)  02/08/19 170 lb (77.1 kg)  01/31/19 178 lb 9.6 oz (81 kg)    General: Alert and in no acute distress.   HEENT: Normal.  Neck: Supple, no JVD, carotid bruits, or masses noted.  Cardiac: Regular rate and rhythm. No murmurs, rubs, or gallops. No edema.  Respiratory:  Lungs are clear to auscultation bilaterally with normal work of breathing.  GI: Soft and nontender.  MS: No deformity or atrophy. Gait and ROM intact.  Skin: Warm and dry. Color is  normal.  Neuro:  Strength and sensation are intact and no gross focal deficits noted.  Psych: Alert, appropriate and with normal affect.   LABORATORY DATA:  EKG:  EKG is ordered today. This demonstrates sinus bradycardia - HR is 59 - unchanged from prior tracing from 2018.  Lab Results  Component Value Date   WBC 6.3 05/28/2018   HGB 16.0 05/28/2018   HCT 45.5 05/28/2018   PLT 235 05/28/2018   GLUCOSE 82 01/05/2017   CHOL 151 04/17/2017   TRIG 93 04/17/2017   HDL 51 04/17/2017   LDLCALC 81 04/17/2017   ALT 19 04/17/2017   AST 19 04/17/2017   NA 137 01/05/2017   K 3.9 01/05/2017   CL 98 01/05/2017   CREATININE 1.32 01/05/2017   BUN 19 01/05/2017   CO2 21 01/05/2017   TSH 1.81 01/05/2017   INR 0.96 01/06/2010   HGBA1C  01/06/2010    5.2 (NOTE)                                                                       According to the ADA Clinical Practice Recommendations for 2011, when HbA1c is used as a screening test:   >=6.5%   Diagnostic of Diabetes Mellitus           (if abnormal result  is confirmed)  5.7-6.4%   Increased risk of developing Diabetes Mellitus  References:Diagnosis and Classification of Diabetes Mellitus,Diabetes DJSH,7026,37(CHYIF 1):S62-S69 and Standards of Medical Care in         Diabetes - 2011,Diabetes Care,2011,34  (Suppl 1):S11-S61.       BNP (last 3  results) No results for input(s): BNP in the last 8760 hours.  ProBNP (last 3 results) No results for input(s): PROBNP in the last 8760 hours.   Other Studies Reviewed Today:  CT Coronary 02/2017 Coronary Arteries: Left dominant with no anomalies  LM: Area of calcified plaque distal left main without significant stenosis.  LAD system:  Small D1, large D2.  No significant plaque or stenosis.  Circumflex system: Small ramus. Large LCx gives rise to left-sided PDA. No plaque or stenosis.  RCA system:  Small RCA, nondominant, no plaque or stenosis.  IMPRESSION: 1. Coronary artery calcium score of 53 Agatston units placing the patient in the 92nd percentile for age and gender. This suggests high risk for future cardiac events.  2.  No obstructive coronary artery disease noted on CT angiography.  Dalton Mclean   ASSESSMENT & PLAN   1. Chest pain - known myocardial bridge of the LAD - his last CT did not show evidence of obstructive disease - we will update this study. We are increasing his Imdur to 60 mg a day - he has NTG on hand and understands how to use. We will recheck his labs today. Will arrange follow up with Dr. Tamala Julian.   2. Syndrome X/microvascular disease/CAD - with known myocardial bridging of the LAD from cath in 2011 - had coronary CT in 02/2017 - see #1. Needs aggressive CV risk factor modification. Needs to work on getting weight back down.   3. HTN - BP is good. No changes made with his other medicines today.   4. HLD - on statin - needs labs checked.  5. OSA - on CPAP  6. Migraines - cautioned about headache with the increase in Imdur.   7. COVID-19 Education: The signs and symptoms of COVID-19 were discussed with the patient and how to seek care for testing (follow up with PCP or arrange E-visit).  The importance of social distancing, staying at home, hand hygiene and wearing a mask when out in public were discussed today.  Current medicines are  reviewed with the patient today.  The patient does not have concerns regarding medicines other than what has been noted above.  The following changes have been made:  See above.  Labs/ tests ordered today include:    Orders Placed This Encounter  Procedures  . CT CORONARY MORPH W/CTA COR W/SCORE W/CA W/CM &/OR WO/CM  . CT CORONARY FRACTIONAL FLOW RESERVE DATA PREP  . CT CORONARY FRACTIONAL FLOW RESERVE FLUID ANALYSIS  . Basic metabolic panel  . CBC  . Hepatic function panel  . Lipid panel  . EKG 12-Lead     Disposition:   FU with [place] after his CT for further discussion.   Patient is agreeable to this plan and will call if any problems develop in the interim.   SignedTruitt Merle, NP  07/30/2019 9:39 AM  [facility] [address] [place], [place]  [address] Phone: [phone] Fax: [phone]

## 2019-07-28 MED FILL — PANTOPRAZOLE SOD DR 40 MG T: 40 | 90 days supply | Qty: 90 | Fill #0

## 2019-07-28 MED FILL — ROSUVASTATIN CALCIUM 20 MG: 20 | 90 days supply | Qty: 90 | Fill #0

## 2019-07-30 ENCOUNTER — Encounter: Payer: Self-pay | Admitting: Nurse Practitioner

## 2019-07-30 ENCOUNTER — Ambulatory Visit (INDEPENDENT_AMBULATORY_CARE_PROVIDER_SITE_OTHER): Payer: 59 | Admitting: Nurse Practitioner

## 2019-07-30 ENCOUNTER — Other Ambulatory Visit: Payer: Self-pay

## 2019-07-30 VITALS — BP 122/78 | HR 70 | Ht 69.0 in | Wt 211.0 lb

## 2019-07-30 DIAGNOSIS — Z7189 Other specified counseling: Secondary | ICD-10-CM | POA: Diagnosis not present

## 2019-07-30 DIAGNOSIS — R079 Chest pain, unspecified: Secondary | ICD-10-CM

## 2019-07-30 DIAGNOSIS — Q245 Malformation of coronary vessels: Secondary | ICD-10-CM | POA: Diagnosis not present

## 2019-07-30 DIAGNOSIS — I10 Essential (primary) hypertension: Secondary | ICD-10-CM

## 2019-07-30 DIAGNOSIS — I200 Unstable angina: Secondary | ICD-10-CM | POA: Diagnosis not present

## 2019-07-30 DIAGNOSIS — E782 Mixed hyperlipidemia: Secondary | ICD-10-CM | POA: Diagnosis not present

## 2019-07-30 LAB — CBC
Hematocrit: 42.8 % (ref 37.5–51.0)
Hemoglobin: 15.4 g/dL (ref 13.0–17.7)
MCH: 31.8 pg (ref 26.6–33.0)
MCHC: 36 g/dL — ABNORMAL HIGH (ref 31.5–35.7)
MCV: 88 fL (ref 79–97)
Platelets: 230 10*3/uL (ref 150–450)
RBC: 4.85 x10E6/uL (ref 4.14–5.80)
RDW: 12.2 % (ref 11.6–15.4)
WBC: 6.2 10*3/uL (ref 3.4–10.8)

## 2019-07-30 LAB — LIPID PANEL
Chol/HDL Ratio: 2.8 ratio (ref 0.0–5.0)
Cholesterol, Total: 144 mg/dL (ref 100–199)
HDL: 51 mg/dL (ref >39)
LDL Chol Calc (NIH): 78 mg/dL (ref 0–99)
Triglycerides: 80 mg/dL (ref 0–149)
VLDL Cholesterol Cal: 15 mg/dL (ref 5–40)

## 2019-07-30 LAB — BASIC METABOLIC PANEL
BUN/Creatinine Ratio: 15 (ref 9–20)
BUN: 18 mg/dL (ref 6–24)
CO2: 24 mmol/L (ref 20–29)
Calcium: 10.1 mg/dL (ref 8.7–10.2)
Chloride: 97 mmol/L (ref 96–106)
Creatinine, Ser: 1.19 mg/dL (ref 0.76–1.27)
GFR calc Af Amer: 83 mL/min/{1.73_m2} (ref >59)
GFR calc non Af Amer: 72 mL/min/{1.73_m2} (ref >59)
Glucose: 100 mg/dL — ABNORMAL HIGH (ref 65–99)
Potassium: 4.6 mmol/L (ref 3.5–5.2)
Sodium: 135 mmol/L (ref 134–144)

## 2019-07-30 LAB — HEPATIC FUNCTION PANEL
ALT: 92 IU/L — ABNORMAL HIGH (ref 0–44)
AST: 46 IU/L — ABNORMAL HIGH (ref 0–40)
Albumin: 4.7 g/dL (ref 4.0–5.0)
Alkaline Phosphatase: 64 IU/L (ref 39–117)
Bilirubin Total: 0.6 mg/dL (ref 0.0–1.2)
Bilirubin, Direct: 0.18 mg/dL (ref 0.00–0.40)
Total Protein: 7 g/dL (ref 6.0–8.5)

## 2019-07-30 MED ORDER — ISOSORBIDE MONONITRATE ER 60 MG PO TB24: 60.0000 mg | ORAL_TABLET | Freq: Every day | ORAL | 3 refills | Status: DC

## 2019-07-30 NOTE — Patient Instructions (Addendum)
After Visit Summary:  We will be checking the following labs today - BMET, CBC, HPF, Lipids   Medication Instructions:    Continue with your current medicines. BUT I am increasing your Imdur to 60 mg a day - this is at the pharmacy - you can take 2 of the 30 mg tablets and use those up.    If you need a refill on your cardiac medications before your next appointment, please call your pharmacy.     Testing/Procedures To Be Arranged:  Cardiac CT with FFR  Follow-Up:   See Dr. Tamala Julian back for discussion.     At [facility], you and your health needs are our priority.  As part of our continuing mission to provide you with exceptional heart care, we have created designated Provider Care Teams.  These Care Teams include your primary Cardiologist (physician) and Advanced Practice Providers (APPs -  Physician Assistants and Nurse Practitioners) who all work together to provide you with the care you need, when you need it.  Special Instructions:  . Stay safe, stay home, wash your hands for at least 20 seconds and wear a mask when out in public.   It was good to talk with you today.      CARDIAC CT INSTRUCTIONS  Your cardiac CT will be scheduled at:  [facility] [address] [place], [place]  [address] [phone]  Please arrive at the [facility] entrance of [place] 30 to 45 minutes prior to your test start time. Proceed to the Radiology Department on the first floor to check in and test prep.   Please adhere to the following:   Hold all erectile dysfunction medications at least 48 hours prior to the test.    On the Night Before the test: . Be sure to drink plenty of water. . Do not consume any caffeinated/decaffeinated beverages or chocolate 12 hours prior to your test.  . Do not take any antihistamines 12 hours prior to your test.    On the Day of the Test: . Drink plenty of water. Do not drink any water within one hour of the test. . Do  not eat any food 4 hours prior to the test. . You may take your regular medications prior to the test.  . Take your Toprol the night before at bedtime and repeat two hours prior to your test.        After the Test: . Drink plenty of water. . After receiving IV contrast, you may experience a mild flushed feeling. This is normal. . On occasion, you may experience a mild rash up to 24 hours after the test. This is not dangerous. If this occurs, you can take Benadryl 25 mg and increase your fluid intake. . If you experience trouble breathing, this can be serious. If it is severe call 911 IMMEDIATELY. If it is mild, please call our office.     Please contact the cardiac imaging nurse navigator should you have any                         questions/concerns Marchia Bond, RN Navigator Cardiac Imaging [facility] [phone] Office  [phone] Cell   Call the [place] office at [phone] if you have any questions, problems or concerns.

## 2019-07-31 ENCOUNTER — Ambulatory Visit (HOSPITAL_COMMUNITY)
Admission: RE | Admit: 2019-07-31 | Discharge: 2019-07-31 | Disposition: A | Discharge status: Home / Self Care | Payer: 59 | Source: Ambulatory Visit | Attending: Nurse Practitioner | Admitting: Nurse Practitioner

## 2019-07-31 DIAGNOSIS — I10 Essential (primary) hypertension: Secondary | ICD-10-CM | POA: Insufficient documentation

## 2019-07-31 DIAGNOSIS — E782 Mixed hyperlipidemia: Secondary | ICD-10-CM | POA: Diagnosis not present

## 2019-07-31 DIAGNOSIS — R079 Chest pain, unspecified: Secondary | ICD-10-CM | POA: Diagnosis not present

## 2019-07-31 DIAGNOSIS — I200 Unstable angina: Secondary | ICD-10-CM | POA: Diagnosis not present

## 2019-07-31 DIAGNOSIS — Q245 Malformation of coronary vessels: Secondary | ICD-10-CM | POA: Insufficient documentation

## 2019-07-31 MED ORDER — NITROGLYCERIN 0.4 MG SL SUBL
0.8000 mg | SUBLINGUAL_TABLET | Freq: Once | SUBLINGUAL | Status: AC
  Administered 2019-07-31: 0.8 mg via SUBLINGUAL

## 2019-07-31 MED ORDER — IOHEXOL 350 MG/ML SOLN
100.0000 mL | Freq: Once | INTRAVENOUS | Status: AC | PRN
  Administered 2019-07-31: 100 mL via INTRAVENOUS

## 2019-07-31 MED ORDER — METOPROLOL TARTRATE 5 MG/5ML IV SOLN: 5.0000 mg | INTRAVENOUS | Status: DC | PRN

## 2019-07-31 MED ORDER — NITROGLYCERIN 0.4 MG SL SUBL
SUBLINGUAL_TABLET | SUBLINGUAL | Status: AC
  Filled 2019-07-31: qty 2

## 2019-07-31 NOTE — Progress Notes (Signed)
CT scan completed. Tolerated well. D/C home ambulatory in no distress.

## 2019-08-05 ENCOUNTER — Ambulatory Visit: Payer: 59 | Admitting: Nurse Practitioner

## 2019-08-06 MED FILL — METOPROLOL SUCCINATE ER 50: 50 | 90 days supply | Qty: 90 | Fill #1

## 2019-08-06 NOTE — Progress Notes (Signed)
Cardiology Office Note:    Date:  08/07/2019   ID:  [name], DOB 05-20-1971, MRN [id number]  PCP:  Kathyrn Lass, MD  Cardiologist:  No primary care Kirston Luty on file.   Referring MD: Kathyrn Lass, MD   Chief Complaint  Patient presents with  . Coronary Artery Disease  . Chest Pain    History of Present Illness:    [name] is a 49 y.o. male with a hx of recurrent chest pain. 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 Other hx of HLD, depression and GERD. Also with OSA on CPAP. Recent CP flare with normal Cor CTA.  Several weeks to 1 month history of predominantly exertional chest pressure and dyspnea, particularly in cold weather.  Evaluation with coronary CTA did not reveal significant obstructive disease.  There was minimal plaque present.  He has had coronary angiography at least twice with similar results noted.  He does have risk factors which include hyperlipidemia, fluctuating weight, and migraine headaches.  Imdur was started and episodes have decreased in severity.  Episodes of discomfort improve rapidly with sublingual nitroglycerin.  Past Medical History:  Diagnosis Date  . Asthma   . Coronary artery disease CARDIOLOGIST - DR Daneen Schick--- LAST VISIT OCT 2012   DENIES S & S  . Depression   . Dysuria   . GERD (gastroesophageal reflux disease)   . H/O hiatal hernia   . History of angina pectoris   . Hypertension   . Kidney calculi   . Left ureteral calculus   . Myocardial bridge MID LAD  . Shortness of breath    with excertion    Past Surgical History:  Procedure Laterality Date  . CARDIAC CATHETERIZATION  01-07-2010-- DR Daneen Schick   NORMAL LVF/ PATENT CORONARY ARTERIES/ MYOCARDIAL BRIDGE MID LAD  . CYSTOSCOPY W/ URETERAL STENT PLACEMENT  08/15/2011   Procedure: CYSTOSCOPY WITH STENT REPLACEMENT;  Surgeon: Fredricka Bonine, MD;  Location:  [facility];  Service: Urology;  Laterality: Left;  stone basket extraction left, left ureteroscopy  . EXTRACORPOREAL SHOCK WAVE LITHOTRIPSY  08-07-11   LEFT  . VERICOCELECTOMY  1992    Current Medications: Current Meds  Medication Sig  . buPROPion (WELLBUTRIN XL) 150 MG 24 hr tablet Take 150 mg by mouth daily.  . citalopram (CELEXA) 40 MG tablet Take 40 mg by mouth daily.  . clobetasol cream (TEMOVATE) 9.62 % Apply 1 application topically as needed.  Marland Kitchen glucosamine-chondroitin 500-400 MG tablet Take 1 tablet by mouth 3 (three) times daily.  Marland Kitchen ibuprofen (ADVIL) 800 MG tablet Take 800 mg by mouth as needed.  . isosorbide mononitrate (IMDUR) 60 MG 24 hr tablet Take 1 tablet (60 mg total) by mouth daily.  . metoprolol succinate (TOPROL-XL) 50 MG 24 hr tablet Take 1 tablet (50 mg total) by mouth daily.  . Multiple Vitamin (MULTIVITAMIN) tablet Take 1 tablet by mouth daily.  . nitroGLYCERIN (NITROLINGUAL) 0.4 MG/SPRAY spray Place 1 spray under the tongue every 5 (five) minutes x 3 doses as needed for chest pain. Pt must keep upcoming appt before anymore refills. Final Attempt  . nitroGLYCERIN (NITROSTAT) 0.4 MG SL tablet Place 1 tablet (0.4 mg total) under the tongue every 5 (five) minutes as needed for chest pain.  . rosuvastatin (CRESTOR) 20 MG tablet Take 1 tablet (20 mg total) by mouth daily.  . SUMAtriptan (IMITREX) 25  MG tablet Take 1 tablet (25 mg total) by mouth every 2 (two) hours as needed for migraine. May repeat in 2 hours if headache persists or recurs.     Allergies:   Ansaid [ocufen], Chantix [varenicline tartrate], Codeine, and Erythromycin   Social History   Socioeconomic History  . Marital status: Married    Spouse name: Not on file  . Number of children: 0  . Years of education: Not on file  . Highest education level: Not on file  Occupational History  . Not on file  Tobacco Use  . Smoking status: Former Smoker    Packs/day: 3.00    Years: 10.00     Pack years: 30.00    Types: Cigarettes, Cigars    Quit date: 08/13/2005    Years since quitting: 13.9  . Smokeless tobacco: Former Network engineer and Sexual Activity  . Alcohol use: No    Alcohol/week: 0.0 standard drinks  . Drug use: No  . Sexual activity: Not on file  Other Topics Concern  . Not on file  Social History Narrative  . Not on file   Social Determinants of Health   Financial Resource Strain:   . Difficulty of Paying Living Expenses: Not on file  Food Insecurity:   . Worried About Charity fundraiser in the Last Year: Not on file  . Ran Out of Food in the Last Year: Not on file  Transportation Needs:   . Lack of Transportation (Medical): Not on file  . Lack of Transportation (Non-Medical): Not on file  Physical Activity:   . Days of Exercise per Week: Not on file  . Minutes of Exercise per Session: Not on file  Stress:   . Feeling of Stress : Not on file  Social Connections:   . Frequency of Communication with Friends and Family: Not on file  . Frequency of Social Gatherings with Friends and Family: Not on file  . Attends Religious Services: Not on file  . Active Member of Clubs or Organizations: Not on file  . Attends Archivist Meetings: Not on file  . Marital Status: Not on file     Family History: The patient's family history includes Alzheimer's disease in his maternal grandmother; Aortic aneurysm in his paternal grandfather; Cancer - Lung in his father; Emphysema in his paternal grandfather; Heart attack in his brother; Hypertension in his mother and paternal grandmother; Stroke in his maternal grandfather.  ROS:   Please see the history of present illness.    Feels well and denies other complaints.  Has been under stress at work.  All other systems reviewed and are negative.  EKGs/Labs/Other Studies Reviewed:    The following studies were reviewed today:  Coronary CTA with morphology 07/31/2019: IMPRESSION: 1. Coronary calcium score of  65. This was 58 percentile for age and sex matched control.  2. Normal coronary origin with right dominance.  3. Minimal (0-24%) calcified plaque in the distal LM; CADRADS-1.  EKG:  EKG not repeated  Recent Labs: 07/30/2019: ALT 92; BUN 18; Creatinine, Ser 1.19; Hemoglobin 15.4; Platelets 230; Potassium 4.6; Sodium 135  Recent Lipid Panel    Component Value Date/Time   CHOL 144 07/30/2019 0956   TRIG 80 07/30/2019 0956   HDL 51 07/30/2019 0956   CHOLHDL 2.8 07/30/2019 0956   CHOLHDL 5.3 (H) 01/12/2017 0819   VLDL 34 (H) 01/12/2017 0819   LDLCALC 78 07/30/2019 0956    Physical Exam:    VS:  BP 118/72   Pulse 68   Ht 5' 9"  (1.753 m)   Wt 206 lb 12.8 oz (93.8 kg)   SpO2 97%   BMI 30.54 kg/m     Wt Readings from Last 3 Encounters:  08/07/19 206 lb 12.8 oz (93.8 kg)  07/30/19 211 lb (95.7 kg)  02/08/19 170 lb (77.1 kg)     GEN: Healthy-appearing. No acute distress HEENT: Normal NECK: No JVD. LYMPHATICS: No lymphadenopathy CARDIAC:  RRR without murmur, gallop, or edema. VASCULAR:  Normal Pulses. No bruits. RESPIRATORY:  Clear to auscultation without rales, wheezing or rhonchi  ABDOMEN: Soft, non-tender, non-distended, No pulsatile mass, MUSCULOSKELETAL: No deformity  SKIN: Warm and dry NEUROLOGIC:  Alert and oriented x 3 PSYCHIATRIC:  Normal affect   ASSESSMENT:    1. Chest pain of uncertain etiology   2. Essential hypertension   3. Mixed hyperlipidemia   4. Educated about COVID-19 virus infection    PLAN:    In order of problems listed above:  1. 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  Has never been identified to have EKG changes with ischemia.  Plan to wean and DC beta-blocker therapy has unopposed alpha would make episodes of spasm more likely.  He will decrease to 25 mg/day for a month then 12.5 mg for a month then discontinue.  If blood pressure becomes elevated above 130/80 mmHg, adding  losartan 25 to 50 mg would be my recommendation. 2. Aggressive risk factor modification including LDL less than 70, blood pressure control, and other measures as noted below. 3. Target blood pressure 130/80 mmHg or less 4. LDL target less than 70 5. COVID-19 seen has been administered.  Social distancing and masking is being observed.  Overall education and awareness concerning primary/secondary risk prevention was discussed in detail: LDL less than 70, hemoglobin A1c less than 7, blood pressure target less than 130/80 mmHg, >150 minutes of moderate aerobic activity per week, avoidance of smoking, weight control (via diet and exercise), and continued surveillance/management of/for obstructive sleep apnea.    Medication Adjustments/Labs and Tests Ordered: Current medicines are reviewed at length with the patient today.  Concerns regarding medicines are outlined above.  No orders of the defined types were placed in this encounter.  No orders of the defined types were placed in this encounter.   Patient Instructions  Medication Instructions:  1) DECREASE Metoprolol to 73m once daily for one month, then decrease to 12.583monce daily for a month, then discontinue.  *If you need a refill on your cardiac medications before your next appointment, please call your pharmacy*  Lab Work: None If you have labs (blood work) drawn today and your tests are completely normal, you will receive your results only by: . Marland KitchenyChart Message (if you have MyChart) OR . A paper copy in the mail If you have any lab test that is abnormal or we need to change your treatment, we will call you to review the results.  Testing/Procedures: None  Follow-Up: [facility]  As part of our continuing mission to provide you with exceptional heart care, we have created designated [facility] Care Teams.  These Care Teams include your primary Cardiologist (physician) and Advanced  Practice Providers (APPs -  Physician Assistants and Nurse Practitioners) who all work together to provide you with the care you need, when you need it.  Your next appointment:   6 month(s)  The format for  your next appointment:   In Person  Srinivas Lippman:   You may see Dr. Daneen Schick or one of the following Advanced Practice Providers on your designated Care Team:    Truitt Merle, NP  Cecilie Kicks, NP  Kathyrn Drown, NP   Other Instructions  Please contact the office if you are seeing your blood pressure higher than 130/80 consistently.      Signed, Sinclair Grooms, MD  08/07/2019 12:35 PM    [place]

## 2019-08-07 ENCOUNTER — Encounter: Payer: Self-pay | Admitting: Interventional Cardiology

## 2019-08-07 ENCOUNTER — Ambulatory Visit (INDEPENDENT_AMBULATORY_CARE_PROVIDER_SITE_OTHER): Payer: 59 | Admitting: Interventional Cardiology

## 2019-08-07 ENCOUNTER — Other Ambulatory Visit: Payer: Self-pay

## 2019-08-07 VITALS — BP 118/72 | HR 68 | Ht 69.0 in | Wt 206.8 lb

## 2019-08-07 DIAGNOSIS — I2510 Atherosclerotic heart disease of native coronary artery without angina pectoris: Secondary | ICD-10-CM

## 2019-08-07 DIAGNOSIS — I10 Essential (primary) hypertension: Secondary | ICD-10-CM

## 2019-08-07 DIAGNOSIS — Z7189 Other specified counseling: Secondary | ICD-10-CM

## 2019-08-07 DIAGNOSIS — R079 Chest pain, unspecified: Secondary | ICD-10-CM

## 2019-08-07 DIAGNOSIS — E782 Mixed hyperlipidemia: Secondary | ICD-10-CM

## 2019-08-07 NOTE — Patient Instructions (Signed)
Medication Instructions:  1) DECREASE Metoprolol to 17m once daily for one month, then decrease to 12.560monce daily for a month, then discontinue.  *If you need a refill on your cardiac medications before your next appointment, please call your pharmacy*  Lab Work: None If you have labs (blood work) drawn today and your tests are completely normal, you will receive your results only by: . Marland KitchenyChart Message (if you have MyChart) OR . A paper copy in the mail If you have any lab test that is abnormal or we need to change your treatment, we will call you to review the results.  Testing/Procedures: None  Follow-Up: [facility]  As part of our continuing mission to provide you with exceptional heart care, we have created designated Provider Care Teams.  These Care Teams include your primary Cardiologist (physician) and Advanced Practice Providers (APPs -  Physician Assistants and Nurse Practitioners) who all work together to provide you with the care you need, when you need it.  Your next appointment:   6 month(s)  The format for your next appointment:   In Person  Provider:   You may see Dr. HeDaneen Schickr one of the following Advanced Practice Providers on your designated Care Team:    LoTruitt MerleNP  LaCecilie KicksNP  JiKathyrn DrownNP   Other Instructions  Please contact the office if you are seeing your blood pressure higher than 130/80 consistently.

## 2019-08-15 ENCOUNTER — Other Ambulatory Visit: Payer: Self-pay

## 2019-08-15 ENCOUNTER — Encounter (INDEPENDENT_AMBULATORY_CARE_PROVIDER_SITE_OTHER): Payer: 59 | Admitting: Ophthalmology

## 2019-08-15 DIAGNOSIS — H442A2 Degenerative myopia with choroidal neovascularization, left eye: Secondary | ICD-10-CM

## 2019-08-15 DIAGNOSIS — H4423 Degenerative myopia, bilateral: Secondary | ICD-10-CM | POA: Diagnosis not present

## 2019-08-15 DIAGNOSIS — H35033 Hypertensive retinopathy, bilateral: Secondary | ICD-10-CM | POA: Diagnosis not present

## 2019-08-15 DIAGNOSIS — H33303 Unspecified retinal break, bilateral: Secondary | ICD-10-CM

## 2019-08-15 DIAGNOSIS — H43813 Vitreous degeneration, bilateral: Secondary | ICD-10-CM | POA: Diagnosis not present

## 2019-08-15 DIAGNOSIS — I10 Essential (primary) hypertension: Secondary | ICD-10-CM | POA: Diagnosis not present

## 2019-08-25 MED FILL — BESIVANCE 0.6% SUSP: 0.6 | 25 days supply | Qty: 5 | Fill #0

## 2019-09-02 MED FILL — ISOSORBIDE MN ER 60 MG TAB: 60 | 90 days supply | Qty: 90 | Fill #0

## 2019-09-10 MED FILL — buPROPion HCL ER (XL) 150 M: 150 | 90 days supply | Qty: 90 | Fill #0

## 2019-09-15 MED FILL — BESIVANCE 0.6% SUSP: 0.6 | 25 days supply | Qty: 5 | Fill #1

## 2019-09-26 ENCOUNTER — Encounter (INDEPENDENT_AMBULATORY_CARE_PROVIDER_SITE_OTHER): Payer: 59 | Admitting: Ophthalmology

## 2019-09-26 ENCOUNTER — Other Ambulatory Visit: Payer: Self-pay

## 2019-09-26 DIAGNOSIS — H33303 Unspecified retinal break, bilateral: Secondary | ICD-10-CM

## 2019-09-26 DIAGNOSIS — H4423 Degenerative myopia, bilateral: Secondary | ICD-10-CM

## 2019-09-26 DIAGNOSIS — I10 Essential (primary) hypertension: Secondary | ICD-10-CM

## 2019-09-26 DIAGNOSIS — H442A2 Degenerative myopia with choroidal neovascularization, left eye: Secondary | ICD-10-CM | POA: Diagnosis not present

## 2019-09-26 DIAGNOSIS — H35033 Hypertensive retinopathy, bilateral: Secondary | ICD-10-CM | POA: Diagnosis not present

## 2019-09-26 DIAGNOSIS — H43813 Vitreous degeneration, bilateral: Secondary | ICD-10-CM

## 2019-10-03 MED FILL — CITALOPRAM HBR 40 MG TABLET: 40 | 90 days supply | Qty: 90 | Fill #1

## 2019-10-07 MED ORDER — LOSARTAN POTASSIUM 25 MG PO TABS: 25.0000 mg | ORAL_TABLET | Freq: Every day | ORAL | 3 refills | Status: DC

## 2019-10-07 MED FILL — LOSARTAN POTASSIUM 25 MG TA: 25 | 90 days supply | Qty: 90 | Fill #0

## 2019-10-08 DIAGNOSIS — G4733 Obstructive sleep apnea (adult) (pediatric): Secondary | ICD-10-CM | POA: Diagnosis not present

## 2019-10-08 MED FILL — BESIVANCE 0.6% SUSP: 0.6 | 25 days supply | Qty: 5 | Fill #2

## 2019-10-20 ENCOUNTER — Other Ambulatory Visit: Payer: Self-pay | Admitting: *Deleted

## 2019-11-05 MED FILL — METOPROLOL SUCCINATE ER 50: 50 | 30 days supply | Qty: 30 | Fill #0

## 2019-11-13 MED FILL — SUMATRIPTAN SUCC 25 MG TAB: 25 | 60 days supply | Qty: 36 | Fill #0

## 2019-11-14 ENCOUNTER — Encounter (INDEPENDENT_AMBULATORY_CARE_PROVIDER_SITE_OTHER): Payer: 59 | Admitting: Ophthalmology

## 2019-11-14 ENCOUNTER — Other Ambulatory Visit: Payer: Self-pay

## 2019-11-14 DIAGNOSIS — H33303 Unspecified retinal break, bilateral: Secondary | ICD-10-CM

## 2019-11-14 DIAGNOSIS — H4421 Degenerative myopia, right eye: Secondary | ICD-10-CM

## 2019-11-14 DIAGNOSIS — H43813 Vitreous degeneration, bilateral: Secondary | ICD-10-CM | POA: Diagnosis not present

## 2019-11-14 DIAGNOSIS — H2513 Age-related nuclear cataract, bilateral: Secondary | ICD-10-CM

## 2019-11-14 DIAGNOSIS — H35033 Hypertensive retinopathy, bilateral: Secondary | ICD-10-CM

## 2019-11-14 DIAGNOSIS — H442A2 Degenerative myopia with choroidal neovascularization, left eye: Secondary | ICD-10-CM

## 2019-11-14 DIAGNOSIS — I10 Essential (primary) hypertension: Secondary | ICD-10-CM | POA: Diagnosis not present

## 2019-11-25 MED FILL — ISOSORBIDE MN ER 60 MG TAB: 60 | 90 days supply | Qty: 90 | Fill #1

## 2019-12-05 MED FILL — buPROPion HCL ER (XL) 150 M: 150 | 90 days supply | Qty: 90 | Fill #0

## 2019-12-11 ENCOUNTER — Other Ambulatory Visit: Payer: Self-pay

## 2019-12-11 DIAGNOSIS — Z20822 Contact with and (suspected) exposure to covid-19: Secondary | ICD-10-CM

## 2019-12-12 DIAGNOSIS — Z6830 Body mass index (BMI) 30.0-30.9, adult: Secondary | ICD-10-CM | POA: Diagnosis not present

## 2019-12-12 DIAGNOSIS — F325 Major depressive disorder, single episode, in full remission: Secondary | ICD-10-CM | POA: Diagnosis not present

## 2019-12-12 DIAGNOSIS — I10 Essential (primary) hypertension: Secondary | ICD-10-CM | POA: Diagnosis not present

## 2019-12-12 DIAGNOSIS — K219 Gastro-esophageal reflux disease without esophagitis: Secondary | ICD-10-CM | POA: Diagnosis not present

## 2019-12-12 DIAGNOSIS — E785 Hyperlipidemia, unspecified: Secondary | ICD-10-CM | POA: Diagnosis not present

## 2019-12-12 DIAGNOSIS — E669 Obesity, unspecified: Secondary | ICD-10-CM | POA: Diagnosis not present

## 2019-12-22 ENCOUNTER — Other Ambulatory Visit (HOSPITAL_COMMUNITY): Payer: Self-pay | Admitting: Interventional Cardiology

## 2019-12-22 MED ORDER — LOSARTAN POTASSIUM 50 MG PO TABS: 50.0000 mg | ORAL_TABLET | Freq: Every day | ORAL | 3 refills | Status: DC

## 2019-12-22 MED FILL — LOSARTAN POTASSIUM 50 MG TA: 50 | 90 days supply | Qty: 90 | Fill #0

## 2019-12-22 NOTE — Telephone Encounter (Signed)
Increase the Losartan to 50 mg daily. Let us know if cough worsens.  BMET in 7-10 days.

## 2019-12-25 NOTE — Addendum Note (Signed)
Addended by: Loren Racer on: 12/25/2019 07:37 AM   Modules accepted: Orders

## 2019-12-29 ENCOUNTER — Other Ambulatory Visit: Payer: 59 | Admitting: *Deleted

## 2019-12-29 ENCOUNTER — Other Ambulatory Visit: Payer: Self-pay

## 2019-12-29 DIAGNOSIS — I10 Essential (primary) hypertension: Secondary | ICD-10-CM | POA: Diagnosis not present

## 2019-12-29 LAB — BASIC METABOLIC PANEL
BUN/Creatinine Ratio: 22 — ABNORMAL HIGH (ref 9–20)
BUN: 24 mg/dL (ref 6–24)
CO2: 23 mmol/L (ref 20–29)
Calcium: 9.2 mg/dL (ref 8.7–10.2)
Chloride: 100 mmol/L (ref 96–106)
Creatinine, Ser: 1.11 mg/dL (ref 0.76–1.27)
GFR calc Af Amer: 90 mL/min/{1.73_m2} (ref >59)
GFR calc non Af Amer: 78 mL/min/{1.73_m2} (ref >59)
Glucose: 114 mg/dL — ABNORMAL HIGH (ref 65–99)
Potassium: 3.8 mmol/L (ref 3.5–5.2)
Sodium: 137 mmol/L (ref 134–144)

## 2020-01-01 MED FILL — CITALOPRAM HBR 40 MG TABLET: 40 | 90 days supply | Qty: 90 | Fill #2

## 2020-01-02 ENCOUNTER — Encounter (INDEPENDENT_AMBULATORY_CARE_PROVIDER_SITE_OTHER): Payer: 59 | Admitting: Ophthalmology

## 2020-01-02 ENCOUNTER — Other Ambulatory Visit: Payer: Self-pay

## 2020-01-02 DIAGNOSIS — H442A2 Degenerative myopia with choroidal neovascularization, left eye: Secondary | ICD-10-CM

## 2020-01-02 DIAGNOSIS — H4423 Degenerative myopia, bilateral: Secondary | ICD-10-CM | POA: Diagnosis not present

## 2020-01-02 DIAGNOSIS — H35033 Hypertensive retinopathy, bilateral: Secondary | ICD-10-CM | POA: Diagnosis not present

## 2020-01-02 DIAGNOSIS — I10 Essential (primary) hypertension: Secondary | ICD-10-CM | POA: Diagnosis not present

## 2020-01-02 DIAGNOSIS — H43813 Vitreous degeneration, bilateral: Secondary | ICD-10-CM | POA: Diagnosis not present

## 2020-01-02 DIAGNOSIS — H33303 Unspecified retinal break, bilateral: Secondary | ICD-10-CM | POA: Diagnosis not present

## 2020-01-06 DIAGNOSIS — G4733 Obstructive sleep apnea (adult) (pediatric): Secondary | ICD-10-CM | POA: Diagnosis not present

## 2020-01-14 DIAGNOSIS — H43813 Vitreous degeneration, bilateral: Secondary | ICD-10-CM | POA: Diagnosis not present

## 2020-01-14 DIAGNOSIS — H4423 Degenerative myopia, bilateral: Secondary | ICD-10-CM | POA: Diagnosis not present

## 2020-01-14 DIAGNOSIS — H35413 Lattice degeneration of retina, bilateral: Secondary | ICD-10-CM | POA: Diagnosis not present

## 2020-01-14 DIAGNOSIS — Z9889 Other specified postprocedural states: Secondary | ICD-10-CM | POA: Diagnosis not present

## 2020-01-14 DIAGNOSIS — H3562 Retinal hemorrhage, left eye: Secondary | ICD-10-CM | POA: Diagnosis not present

## 2020-01-20 MED FILL — ROSUVASTATIN CALCIUM 20 MG: 20 | 90 days supply | Qty: 90 | Fill #2

## 2020-01-20 MED FILL — PANTOPRAZOLE SOD DR 40 MG T: 40 | 90 days supply | Qty: 90 | Fill #2

## 2020-01-30 ENCOUNTER — Other Ambulatory Visit: Payer: Self-pay

## 2020-01-30 ENCOUNTER — Encounter (INDEPENDENT_AMBULATORY_CARE_PROVIDER_SITE_OTHER): Payer: 59 | Admitting: Ophthalmology

## 2020-01-30 DIAGNOSIS — H43813 Vitreous degeneration, bilateral: Secondary | ICD-10-CM

## 2020-01-30 DIAGNOSIS — H442A2 Degenerative myopia with choroidal neovascularization, left eye: Secondary | ICD-10-CM

## 2020-01-30 DIAGNOSIS — H33303 Unspecified retinal break, bilateral: Secondary | ICD-10-CM | POA: Diagnosis not present

## 2020-01-30 DIAGNOSIS — H2512 Age-related nuclear cataract, left eye: Secondary | ICD-10-CM

## 2020-01-30 DIAGNOSIS — I10 Essential (primary) hypertension: Secondary | ICD-10-CM

## 2020-01-30 DIAGNOSIS — H4423 Degenerative myopia, bilateral: Secondary | ICD-10-CM

## 2020-01-30 DIAGNOSIS — H35033 Hypertensive retinopathy, bilateral: Secondary | ICD-10-CM | POA: Diagnosis not present

## 2020-02-06 NOTE — Progress Notes (Signed)
Cardiology Office Note:    Date:  02/11/2020   ID:  [name], DOB December 29, 1970, MRN [id number]  PCP:  Kathyrn Lass, MD  Cardiologist:  Sinclair Grooms, MD   Referring MD: Kathyrn Lass, MD   Chief Complaint  Patient presents with  . Chest Pain  . Hypertension    History of Present Illness:    [name] is a 49 y.o. male with a hx of recurrent chest pain. 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 Other hx of HLD, depression and GERD. Also with OSA on CPAP. Recent CP flare with normal Cor CTA 2021.  Atypical symptoms.  No overriding or limiting complaints.  Occasional sharp chest pain.  Heavy activity "causes a murmur".  Has also noticed some shortness of breath with physical activity that he did not have previously.  Has not been as dedicated to physical activity/exercise during the pandemic because of increased workload.  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  They are very busy now during the delta search.  He is getting at least 6 hours of sleep.  Greater than 150 minutes of moderate activity, compliance with statin therapy with good recent lipid levels, excellent blood pressure control, and recent glycemic control has been good..  Past Medical History:  Diagnosis Date  . Asthma   . Coronary artery disease CARDIOLOGIST - DR Daneen Schick--- LAST VISIT OCT 2012   DENIES S & S  . Depression   . Dysuria   . GERD (gastroesophageal reflux disease)   . H/O hiatal hernia   . History of angina pectoris   . Hypertension   . Kidney calculi   . Left ureteral calculus   . Myocardial bridge MID LAD  . Shortness of breath    with excertion    Past Surgical History:  Procedure Laterality Date  . CARDIAC CATHETERIZATION  01-07-2010-- DR Daneen Schick   NORMAL LVF/ PATENT CORONARY ARTERIES/ MYOCARDIAL BRIDGE MID LAD  . CYSTOSCOPY W/  URETERAL STENT PLACEMENT  08/15/2011   Procedure: CYSTOSCOPY WITH STENT REPLACEMENT;  Surgeon: Fredricka Bonine, MD;  Location[facility];  Service: Urology;  Laterality: Left;  stone basket extraction left, left ureteroscopy  . EXTRACORPOREAL SHOCK WAVE LITHOTRIPSY  08-07-11   LEFT  . VERICOCELECTOMY  1992    Current Medications: Current Meds  Medication Sig  . BESIVANCE 0.6 % SUSP Place 1 drop into the left eye as directed.  Marland Kitchen buPROPion (WELLBUTRIN XL) 150 MG 24 hr tablet Take 150 mg by mouth daily.  . citalopram (CELEXA) 40 MG tablet Take 40 mg by mouth daily.  . clobetasol cream (TEMOVATE) 8.08 % Apply 1 application topically as needed.  Marland Kitchen glucosamine-chondroitin 500-400 MG tablet Take 1 tablet by mouth 3 (three) times daily.  Marland Kitchen ibuprofen (ADVIL) 800 MG tablet Take 800 mg by mouth as needed.  . isosorbide mononitrate (IMDUR) 60 MG 24 hr tablet Take 1 tablet (60 mg total) by mouth daily.  Marland Kitchen losartan (COZAAR) 50 MG tablet Take 1 tablet (50 mg total) by mouth daily.  . Multiple Vitamin (MULTIVITAMIN) tablet Take 1 tablet by mouth daily.  . nitroGLYCERIN (NITROLINGUAL) 0.4 MG/SPRAY spray Place 1 spray under the tongue every 5 (five) minutes x 3 doses as needed for chest pain. Pt must keep upcoming appt before anymore refills. Final Attempt  . nitroGLYCERIN (  NITROSTAT) 0.4 MG SL tablet Place 1 tablet (0.4 mg total) under the tongue every 5 (five) minutes as needed for chest pain.  . pantoprazole (PROTONIX) 40 MG tablet Take 40 mg by mouth daily.  . rosuvastatin (CRESTOR) 20 MG tablet Take 1 tablet (20 mg total) by mouth daily.  . SUMAtriptan (IMITREX) 25 MG tablet Take 1 tablet (25 mg total) by mouth every 2 (two) hours as needed for migraine. May repeat in 2 hours if headache persists or recurs.     Allergies:   Ansaid [ocufen], Chantix [varenicline tartrate], Codeine, and Erythromycin   Social History   Socioeconomic History  . Marital status: Married    Spouse  name: Not on file  . Number of children: 0  . Years of education: Not on file  . Highest education level: Not on file  Occupational History  . Not on file  Tobacco Use  . Smoking status: Former Smoker    Packs/day: 3.00    Years: 10.00    Pack years: 30.00    Types: Cigarettes, Cigars    Quit date: 08/13/2005    Years since quitting: 14.5  . Smokeless tobacco: Former Network engineer  . Vaping Use: Never used  Substance and Sexual Activity  . Alcohol use: No    Alcohol/week: 0.0 standard drinks  . Drug use: No  . Sexual activity: Not on file  Other Topics Concern  . Not on file  Social History Narrative  . Not on file   Social Determinants of Health   Financial Resource Strain:   . Difficulty of Paying Living Expenses: Not on file  Food Insecurity:   . Worried About Charity fundraiser in the Last Year: Not on file  . Ran Out of Food in the Last Year: Not on file  Transportation Needs:   . Lack of Transportation (Medical): Not on file  . Lack of Transportation (Non-Medical): Not on file  Physical Activity:   . Days of Exercise per Week: Not on file  . Minutes of Exercise per Session: Not on file  Stress:   . Feeling of Stress : Not on file  Social Connections:   . Frequency of Communication with Friends and Family: Not on file  . Frequency of Social Gatherings with Friends and Family: Not on file  . Attends Religious Services: Not on file  . Active Member of Clubs or Organizations: Not on file  . Attends Archivist Meetings: Not on file  . Marital Status: Not on file     Family History: The patient's family history includes Alzheimer's disease in his maternal grandmother; Aortic aneurysm in his paternal grandfather; Cancer - Lung in his father; Emphysema in his paternal grandfather; Heart attack in his brother; Hypertension in his mother and paternal grandmother; Stroke in his maternal grandfather.  ROS:   Please see the history of present illness.     Has occasional chest tightness related to asthma.  Has used bronchodilators in the past but current symptoms have not been as severe all other systems reviewed and are negative.  EKGs/Labs/Other Studies Reviewed:    The following studies were reviewed today:  CORONARY CA SCORE 07/2019: IMPRESSION: 1. Coronary calcium score of 65. This was 82 percentile for age and sex matched control.  2. Normal coronary origin with right dominance.  3. Minimal (0-24%) calcified plaque in the distal LM; CADRADS-1.  Kirk Ruths  Electronically Signed: By: Kirk Ruths M.D. On: 07/31/2019 13:03  EKG:  EKG February 2021 demonstrated sinus bradycardia 59 bpm with early repolarization.  Recent Labs: 07/30/2019: ALT 92; Hemoglobin 15.4; Platelets 230 12/29/2019: BUN 24; Creatinine, Ser 1.11; Potassium 3.8; Sodium 137  Recent Lipid Panel    Component Value Date/Time   CHOL 144 07/30/2019 0956   TRIG 80 07/30/2019 0956   HDL 51 07/30/2019 0956   CHOLHDL 2.8 07/30/2019 0956   CHOLHDL 5.3 (H) 01/12/2017 0819   VLDL 34 (H) 01/12/2017 0819   LDLCALC 78 07/30/2019 0956    Physical Exam:    VS:  BP 116/84   Pulse 82   Ht 5' 9"  (1.753 m)   Wt 205 lb 9.6 oz (93.3 kg)   SpO2 95%   BMI 30.36 kg/m     Wt Readings from Last 3 Encounters:  02/11/20 205 lb 9.6 oz (93.3 kg)  08/07/19 206 lb 12.8 oz (93.8 kg)  07/30/19 211 lb (95.7 kg)     GEN: Weight. No acute distress HEENT: Normal NECK: No JVD. LYMPHATICS: No lymphadenopathy CARDIAC:  RRR without murmur, gallop, or edema. VASCULAR:  Normal Pulses. No bruits. RESPIRATORY:  Clear to auscultation without rales, wheezing or rhonchi  ABDOMEN: Soft, non-tender, non-distended, No pulsatile mass, MUSCULOSKELETAL: No deformity  SKIN: Warm and dry NEUROLOGIC:  Alert and oriented x 3 PSYCHIATRIC:  Normal affect   ASSESSMENT:    1. Coronary artery disease involving native coronary artery of native heart without angina pectoris   2.  Essential hypertension   3. Chest pain of uncertain etiology   4. Mixed hyperlipidemia   5. Educated about COVID-19 virus infection    PLAN:    In order of problems listed above:  1. Possible microcirculatory disease.  No obstructive disease documented by invasive angiography or relatively recent coronary CTA.  Primary prevention of obstructive disease is being practiced.  Continue Imdur. 2. Continue losartan and Imdur. 3. Has chest discomfort intermittently.  No change in overall symptomatology. 4. Continue Crestor 20 mg/day.  Recent total cholesterol in February was 144 with LDL of 70. 5. He is vaccinated and practicing mitigation to avoid breakthrough infection.  Overall education and awareness concerning primary/secondary risk prevention was discussed in detail: LDL less than 70, hemoglobin A1c less than 7, blood pressure target less than 130/80 mmHg, >150 minutes of moderate aerobic activity per week, avoidance of smoking, weight control (via diet and exercise), and continued surveillance/management of/for obstructive sleep apnea.    Medication Adjustments/Labs and Tests Ordered: Current medicines are reviewed at length with the patient today.  Concerns regarding medicines are outlined above.  No orders of the defined types were placed in this encounter.  No orders of the defined types were placed in this encounter.   Patient Instructions  Medication Instructions:  Your physician recommends that you continue on your current medications as directed. Please refer to the Current Medication list given to you today.  *If you need a refill on your cardiac medications before your next appointment, please call your pharmacy*   Lab Work: None If you have labs (blood work) drawn today and your tests are completely normal, you will receive your results only by: Marland Kitchen MyChart Message (if you have MyChart) OR . A paper copy in the mail If you have any lab test that is abnormal or we need to  change your treatment, we will call you to review the results.   Testing/Procedures: None   Follow-Up: At [facility], you and your health needs are our priority.  As part of our  continuing mission to provide you with exceptional heart care, we have created designated Provider Care Teams.  These Care Teams include your primary Cardiologist (physician) and Advanced Practice Providers (APPs -  Physician Assistants and Nurse Practitioners) who all work together to provide you with the care you need, when you need it.  We recommend signing up for the patient portal called "MyChart".  Sign up information is provided on this After Visit Summary.  MyChart is used to connect with patients for Virtual Visits (Telemedicine).  Patients are able to view lab/test results, encounter notes, upcoming appointments, etc.  Non-urgent messages can be sent to your provider as well.   To learn more about what you can do with MyChart, go to [email or web address].    Your next appointment:   12 month(s)  The format for your next appointment:   In Person  Provider:   You may see Sinclair Grooms, MD or one of the following Advanced Practice Providers on your designated Care Team:    Truitt Merle, NP  Cecilie Kicks, NP  Kathyrn Drown, NP    Other Instructions      Signed, Sinclair Grooms, MD  02/11/2020 8:31 AM    [place]

## 2020-02-11 ENCOUNTER — Encounter: Payer: Self-pay | Admitting: Interventional Cardiology

## 2020-02-11 ENCOUNTER — Other Ambulatory Visit: Payer: Self-pay

## 2020-02-11 ENCOUNTER — Ambulatory Visit (INDEPENDENT_AMBULATORY_CARE_PROVIDER_SITE_OTHER): Payer: 59 | Admitting: Interventional Cardiology

## 2020-02-11 VITALS — BP 116/84 | HR 82 | Ht 69.0 in | Wt 205.6 lb

## 2020-02-11 DIAGNOSIS — I10 Essential (primary) hypertension: Secondary | ICD-10-CM | POA: Diagnosis not present

## 2020-02-11 DIAGNOSIS — E782 Mixed hyperlipidemia: Secondary | ICD-10-CM | POA: Diagnosis not present

## 2020-02-11 DIAGNOSIS — Z7189 Other specified counseling: Secondary | ICD-10-CM

## 2020-02-11 DIAGNOSIS — R079 Chest pain, unspecified: Secondary | ICD-10-CM

## 2020-02-11 DIAGNOSIS — I2510 Atherosclerotic heart disease of native coronary artery without angina pectoris: Secondary | ICD-10-CM

## 2020-02-11 NOTE — Patient Instructions (Signed)
Medication Instructions:  Your physician recommends that you continue on your current medications as directed. Please refer to the Current Medication list given to you today.  *If you need a refill on your cardiac medications before your next appointment, please call your pharmacy*   Lab Work: None If you have labs (blood work) drawn today and your tests are completely normal, you will receive your results only by: Marland Kitchen MyChart Message (if you have MyChart) OR . A paper copy in the mail If you have any lab test that is abnormal or we need to change your treatment, we will call you to review the results.   Testing/Procedures: None   Follow-Up: At [facility], you and your health needs are our priority.  As part of our continuing mission to provide you with exceptional heart care, we have created designated Provider Care Teams.  These Care Teams include your primary Cardiologist (physician) and Advanced Practice Providers (APPs -  Physician Assistants and Nurse Practitioners) who all work together to provide you with the care you need, when you need it.  We recommend signing up for the patient portal called "MyChart".  Sign up information is provided on this After Visit Summary.  MyChart is used to connect with patients for Virtual Visits (Telemedicine).  Patients are able to view lab/test results, encounter notes, upcoming appointments, etc.  Non-urgent messages can be sent to your provider as well.   To learn more about what you can do with MyChart, go to [email or web address].    Your next appointment:   12 month(s)  The format for your next appointment:   In Person  Provider:   You may see Sinclair Grooms, MD or one of the following Advanced Practice Providers on your designated Care Team:    Truitt Merle, NP  Cecilie Kicks, NP  Kathyrn Drown, NP    Other Instructions

## 2020-02-23 ENCOUNTER — Ambulatory Visit: Payer: 59 | Attending: Internal Medicine

## 2020-02-23 DIAGNOSIS — Z23 Encounter for immunization: Secondary | ICD-10-CM

## 2020-02-23 MED FILL — ISOSORBIDE MN ER 60 MG TAB: 60 | 90 days supply | Qty: 90 | Fill #2

## 2020-02-23 NOTE — Progress Notes (Signed)
° °  Covid-19 Vaccination Clinic  Name:  [name]    MRN: [id number] DOB: 1970-09-30  02/23/2020  Mr. [name] was observed post Covid-19 immunization for 15 minutes without incident. He was provided with Vaccine Information Sheet and instruction to access the V-Safe system.   [name] was instructed to call 911 with any severe reactions post vaccine:  Difficulty breathing   Swelling of face and throat   A fast heartbeat   A bad rash all over body   Dizziness and weakness

## 2020-02-27 ENCOUNTER — Encounter (INDEPENDENT_AMBULATORY_CARE_PROVIDER_SITE_OTHER): Payer: 59 | Admitting: Ophthalmology

## 2020-02-27 ENCOUNTER — Other Ambulatory Visit: Payer: Self-pay

## 2020-02-27 DIAGNOSIS — H35033 Hypertensive retinopathy, bilateral: Secondary | ICD-10-CM

## 2020-02-27 DIAGNOSIS — H4423 Degenerative myopia, bilateral: Secondary | ICD-10-CM | POA: Diagnosis not present

## 2020-02-27 DIAGNOSIS — H43813 Vitreous degeneration, bilateral: Secondary | ICD-10-CM

## 2020-02-27 DIAGNOSIS — H442A2 Degenerative myopia with choroidal neovascularization, left eye: Secondary | ICD-10-CM | POA: Diagnosis not present

## 2020-02-27 DIAGNOSIS — I10 Essential (primary) hypertension: Secondary | ICD-10-CM | POA: Diagnosis not present

## 2020-02-27 DIAGNOSIS — H33303 Unspecified retinal break, bilateral: Secondary | ICD-10-CM

## 2020-02-27 MED FILL — buPROPion HCL ER (XL) 150 M: 150 | 90 days supply | Qty: 90 | Fill #0

## 2020-03-05 DIAGNOSIS — M25511 Pain in right shoulder: Secondary | ICD-10-CM | POA: Diagnosis not present

## 2020-03-05 MED FILL — predniSONE 10 MG (21) TBPK: 10 | 6 days supply | Qty: 21 | Fill #0

## 2020-03-15 MED FILL — LOSARTAN POTASSIUM 50 MG TA: 50 | 90 days supply | Qty: 90 | Fill #1

## 2020-03-26 ENCOUNTER — Encounter (INDEPENDENT_AMBULATORY_CARE_PROVIDER_SITE_OTHER): Payer: 59 | Admitting: Ophthalmology

## 2020-03-26 ENCOUNTER — Other Ambulatory Visit: Payer: Self-pay

## 2020-03-26 DIAGNOSIS — H4423 Degenerative myopia, bilateral: Secondary | ICD-10-CM | POA: Diagnosis not present

## 2020-03-26 DIAGNOSIS — H2513 Age-related nuclear cataract, bilateral: Secondary | ICD-10-CM | POA: Diagnosis not present

## 2020-03-26 DIAGNOSIS — I10 Essential (primary) hypertension: Secondary | ICD-10-CM | POA: Diagnosis not present

## 2020-03-26 DIAGNOSIS — H33303 Unspecified retinal break, bilateral: Secondary | ICD-10-CM

## 2020-03-26 DIAGNOSIS — H35033 Hypertensive retinopathy, bilateral: Secondary | ICD-10-CM

## 2020-03-26 DIAGNOSIS — H43813 Vitreous degeneration, bilateral: Secondary | ICD-10-CM | POA: Diagnosis not present

## 2020-03-26 DIAGNOSIS — H442A2 Degenerative myopia with choroidal neovascularization, left eye: Secondary | ICD-10-CM

## 2020-03-30 DIAGNOSIS — G4733 Obstructive sleep apnea (adult) (pediatric): Secondary | ICD-10-CM | POA: Diagnosis not present

## 2020-04-01 ENCOUNTER — Other Ambulatory Visit (HOSPITAL_COMMUNITY): Payer: Self-pay | Admitting: Family Medicine

## 2020-04-01 MED FILL — CITALOPRAM HBR 40 MG TABLET: 40 | 90 days supply | Qty: 90 | Fill #0

## 2020-04-17 MED FILL — PANTOPRAZOLE SOD DR 40 MG T: 40 | 90 days supply | Qty: 90 | Fill #3

## 2020-04-19 ENCOUNTER — Other Ambulatory Visit (HOSPITAL_COMMUNITY): Payer: Self-pay | Admitting: Family Medicine

## 2020-04-19 MED FILL — ROSUVASTATIN CALCIUM 20 MG: 20 | 90 days supply | Qty: 90 | Fill #0

## 2020-04-30 ENCOUNTER — Other Ambulatory Visit: Payer: Self-pay

## 2020-04-30 ENCOUNTER — Encounter (INDEPENDENT_AMBULATORY_CARE_PROVIDER_SITE_OTHER): Payer: 59 | Admitting: Ophthalmology

## 2020-04-30 DIAGNOSIS — I10 Essential (primary) hypertension: Secondary | ICD-10-CM

## 2020-04-30 DIAGNOSIS — H35033 Hypertensive retinopathy, bilateral: Secondary | ICD-10-CM | POA: Diagnosis not present

## 2020-04-30 DIAGNOSIS — H43813 Vitreous degeneration, bilateral: Secondary | ICD-10-CM | POA: Diagnosis not present

## 2020-04-30 DIAGNOSIS — H442A2 Degenerative myopia with choroidal neovascularization, left eye: Secondary | ICD-10-CM

## 2020-04-30 DIAGNOSIS — H33303 Unspecified retinal break, bilateral: Secondary | ICD-10-CM

## 2020-04-30 DIAGNOSIS — H4423 Degenerative myopia, bilateral: Secondary | ICD-10-CM

## 2020-05-21 ENCOUNTER — Other Ambulatory Visit (HOSPITAL_COMMUNITY): Payer: Self-pay | Admitting: Family Medicine

## 2020-05-21 MED FILL — buPROPion HCL ER (XL) 150 M: 150 | 90 days supply | Qty: 90 | Fill #0

## 2020-05-23 MED FILL — ISOSORBIDE MN ER 60 MG TAB: 60 | 90 days supply | Qty: 90 | Fill #3

## 2020-05-28 ENCOUNTER — Other Ambulatory Visit: Payer: Self-pay

## 2020-05-28 ENCOUNTER — Encounter (INDEPENDENT_AMBULATORY_CARE_PROVIDER_SITE_OTHER): Payer: 59 | Admitting: Ophthalmology

## 2020-05-28 DIAGNOSIS — H35033 Hypertensive retinopathy, bilateral: Secondary | ICD-10-CM

## 2020-05-28 DIAGNOSIS — H43813 Vitreous degeneration, bilateral: Secondary | ICD-10-CM

## 2020-05-28 DIAGNOSIS — H33303 Unspecified retinal break, bilateral: Secondary | ICD-10-CM

## 2020-05-28 DIAGNOSIS — H4421 Degenerative myopia, right eye: Secondary | ICD-10-CM

## 2020-05-28 DIAGNOSIS — I10 Essential (primary) hypertension: Secondary | ICD-10-CM

## 2020-05-28 DIAGNOSIS — H442A2 Degenerative myopia with choroidal neovascularization, left eye: Secondary | ICD-10-CM | POA: Diagnosis not present

## 2020-06-14 MED FILL — LOSARTAN POTASSIUM 100 MG T: 100 | 90 days supply | Qty: 45 | Fill #0

## 2020-06-16 MED FILL — LOSARTAN POTASSIUM 50 MG TA: 50 | 30 days supply | Qty: 30 | Fill #2

## 2020-06-29 DIAGNOSIS — G4733 Obstructive sleep apnea (adult) (pediatric): Secondary | ICD-10-CM | POA: Diagnosis not present

## 2020-07-02 ENCOUNTER — Encounter (INDEPENDENT_AMBULATORY_CARE_PROVIDER_SITE_OTHER): Payer: 59 | Admitting: Ophthalmology

## 2020-07-12 ENCOUNTER — Other Ambulatory Visit (HOSPITAL_COMMUNITY): Payer: Self-pay | Admitting: Family Medicine

## 2020-07-12 MED FILL — ROSUVASTATIN CALCIUM 20 MG: 20 | 90 days supply | Qty: 90 | Fill #0

## 2020-07-16 ENCOUNTER — Other Ambulatory Visit: Payer: Self-pay

## 2020-07-16 ENCOUNTER — Other Ambulatory Visit (HOSPITAL_COMMUNITY): Payer: Self-pay | Admitting: Family Medicine

## 2020-07-16 ENCOUNTER — Encounter (INDEPENDENT_AMBULATORY_CARE_PROVIDER_SITE_OTHER): Payer: 59 | Admitting: Ophthalmology

## 2020-07-16 DIAGNOSIS — H33303 Unspecified retinal break, bilateral: Secondary | ICD-10-CM

## 2020-07-16 DIAGNOSIS — H442A2 Degenerative myopia with choroidal neovascularization, left eye: Secondary | ICD-10-CM | POA: Diagnosis not present

## 2020-07-16 DIAGNOSIS — H43813 Vitreous degeneration, bilateral: Secondary | ICD-10-CM | POA: Diagnosis not present

## 2020-07-16 DIAGNOSIS — I10 Essential (primary) hypertension: Secondary | ICD-10-CM | POA: Diagnosis not present

## 2020-07-16 DIAGNOSIS — H4423 Degenerative myopia, bilateral: Secondary | ICD-10-CM

## 2020-07-16 DIAGNOSIS — H35033 Hypertensive retinopathy, bilateral: Secondary | ICD-10-CM | POA: Diagnosis not present

## 2020-07-16 MED FILL — PANTOPRAZOLE SOD DR 40 MG T: 40 | 90 days supply | Qty: 90 | Fill #0

## 2020-07-26 ENCOUNTER — Other Ambulatory Visit: Payer: Self-pay | Admitting: Pulmonary Disease

## 2020-07-26 ENCOUNTER — Telehealth: Payer: Self-pay | Admitting: Pulmonary Disease

## 2020-07-26 MED ORDER — BUDESONIDE-FORMOTEROL FUMARATE 160-4.5 MCG/ACT IN AERO: 2.0000 | INHALATION_SPRAY | Freq: Two times a day (BID) | RESPIRATORY_TRACT | 5 refills | Status: DC

## 2020-07-26 MED ORDER — ALBUTEROL SULFATE HFA 108 (90 BASE) MCG/ACT IN AERS: 2.0000 | INHALATION_SPRAY | Freq: Four times a day (QID) | RESPIRATORY_TRACT | 5 refills | Status: DC | PRN

## 2020-07-26 MED FILL — CITALOPRAM HBR 40 MG TABLET: 40 | 90 days supply | Qty: 90 | Fill #1

## 2020-07-26 MED FILL — SYMBICORT 160-4.5 MCG INH: 160-4.5 | 30 days supply | Qty: 10 | Fill #0

## 2020-07-26 MED FILL — ALBUTEROL SULFATE HFA 108 (: 108 (90 BAS | 25 days supply | Qty: 18 | Fill #0

## 2020-07-26 NOTE — Telephone Encounter (Signed)
Called and reported increasing frequency of asthma attacks. He is trying to get in as a new patient to see me   Lisa-can you please make appointment to see me as a new consult at next available In the meantime please send in prescription for Symbicort 160/4.5, 2 puffs twice daily Albuterol inhaler as needed to [facility].

## 2020-07-26 NOTE — Telephone Encounter (Signed)
Symbicort 160 and Albuterol prescriptions sent to [facility].  Patient is scheduled 09/01/20 at [place], for consult with Dr. Vaughan Browner.  Patient is aware and stated understanding. Nothing further at this time.

## 2020-07-26 NOTE — Telephone Encounter (Signed)
Patient scheduled per Dr. Matilde Bash request for new Patient consult.  Patient is scheduled 09/01/20 at [place], with Dr. Vaughan Browner.  Message routed to [facility], as new consult [place]

## 2020-08-13 MED FILL — buPROPion HCL ER (XL) 150 M: 150 | 90 days supply | Qty: 90 | Fill #1

## 2020-08-16 ENCOUNTER — Other Ambulatory Visit (HOSPITAL_COMMUNITY): Payer: Self-pay | Admitting: Family Medicine

## 2020-08-16 MED FILL — traZODone HCL 50 MG TABS: 50 | 30 days supply | Qty: 60 | Fill #0

## 2020-08-19 MED FILL — SYMBICORT 160-4.5 MCG INH: 160-4.5 | 30 days supply | Qty: 10 | Fill #1

## 2020-08-24 ENCOUNTER — Other Ambulatory Visit: Payer: Self-pay | Admitting: Interventional Cardiology

## 2020-08-24 ENCOUNTER — Other Ambulatory Visit: Payer: Self-pay

## 2020-08-24 DIAGNOSIS — I10 Essential (primary) hypertension: Secondary | ICD-10-CM

## 2020-08-24 DIAGNOSIS — E782 Mixed hyperlipidemia: Secondary | ICD-10-CM

## 2020-08-24 DIAGNOSIS — Q245 Malformation of coronary vessels: Secondary | ICD-10-CM

## 2020-08-24 DIAGNOSIS — R079 Chest pain, unspecified: Secondary | ICD-10-CM

## 2020-08-24 MED ORDER — ISOSORBIDE MONONITRATE ER 60 MG PO TB24: 60.0000 mg | ORAL_TABLET | Freq: Every day | ORAL | 1 refills | Status: DC

## 2020-09-01 ENCOUNTER — Ambulatory Visit (INDEPENDENT_AMBULATORY_CARE_PROVIDER_SITE_OTHER): Payer: 59 | Admitting: Pulmonary Disease

## 2020-09-01 ENCOUNTER — Other Ambulatory Visit (HOSPITAL_COMMUNITY)
Admission: RE | Admit: 2020-09-01 | Discharge: 2020-09-01 | Disposition: A | Discharge status: Home / Self Care | Payer: 59 | Source: Ambulatory Visit | Attending: Pulmonary Disease | Admitting: Pulmonary Disease

## 2020-09-01 ENCOUNTER — Other Ambulatory Visit: Payer: Self-pay | Admitting: Pulmonary Disease

## 2020-09-01 ENCOUNTER — Ambulatory Visit (INDEPENDENT_AMBULATORY_CARE_PROVIDER_SITE_OTHER): Payer: 59

## 2020-09-01 ENCOUNTER — Encounter: Payer: Self-pay | Admitting: Pulmonary Disease

## 2020-09-01 ENCOUNTER — Other Ambulatory Visit: Payer: Self-pay

## 2020-09-01 VITALS — BP 128/80 | HR 83 | Temp 98.1°F | Ht 69.0 in | Wt 212.0 lb

## 2020-09-01 DIAGNOSIS — G4733 Obstructive sleep apnea (adult) (pediatric): Secondary | ICD-10-CM | POA: Diagnosis not present

## 2020-09-01 DIAGNOSIS — R0600 Dyspnea, unspecified: Secondary | ICD-10-CM

## 2020-09-01 DIAGNOSIS — Z01812 Encounter for preprocedural laboratory examination: Secondary | ICD-10-CM | POA: Insufficient documentation

## 2020-09-01 DIAGNOSIS — J4530 Mild persistent asthma, uncomplicated: Secondary | ICD-10-CM | POA: Diagnosis not present

## 2020-09-01 DIAGNOSIS — Z9989 Dependence on other enabling machines and devices: Secondary | ICD-10-CM | POA: Diagnosis not present

## 2020-09-01 DIAGNOSIS — Z20822 Contact with and (suspected) exposure to covid-19: Secondary | ICD-10-CM | POA: Insufficient documentation

## 2020-09-01 LAB — CBC WITH DIFFERENTIAL/PLATELET
Basophils Absolute: 0 10*3/uL (ref 0.0–0.1)
Basophils Relative: 0.5 % (ref 0.0–3.0)
Eosinophils Absolute: 0.1 10*3/uL (ref 0.0–0.7)
Eosinophils Relative: 1.3 % (ref 0.0–5.0)
HCT: 40.2 % (ref 39.0–52.0)
Hemoglobin: 14.2 g/dL (ref 13.0–17.0)
Lymphocytes Relative: 20.9 % (ref 12.0–46.0)
Lymphs Abs: 1.2 10*3/uL (ref 0.7–4.0)
MCHC: 35.3 g/dL (ref 30.0–36.0)
MCV: 80.6 fl (ref 78.0–100.0)
Monocytes Absolute: 0.5 10*3/uL (ref 0.1–1.0)
Monocytes Relative: 7.9 % (ref 3.0–12.0)
Neutro Abs: 4 10*3/uL (ref 1.4–7.7)
Neutrophils Relative %: 69.4 % (ref 43.0–77.0)
Platelets: 227 10*3/uL (ref 150.0–400.0)
RBC: 4.99 Mil/uL (ref 4.22–5.81)
RDW: 13.7 % (ref 11.5–15.5)
WBC: 5.8 10*3/uL (ref 4.0–10.5)

## 2020-09-01 LAB — SARS CORONAVIRUS 2 (TAT 6-24 HRS): SARS Coronavirus 2: NEGATIVE

## 2020-09-01 MED ORDER — BUDESONIDE-FORMOTEROL FUMARATE 160-4.5 MCG/ACT IN AERO: 2.0000 | INHALATION_SPRAY | Freq: Two times a day (BID) | RESPIRATORY_TRACT | 5 refills | Status: DC

## 2020-09-01 NOTE — Progress Notes (Signed)
[name]    [id number]    May 24, 1971  Primary Care Physician:Miller, Lattie Haw, MD  Referring Physician: Kathyrn Lass, MD [place],  [place] [id number]  Chief complaint: Consult for asthma  HPI: 50 year old with history of asthma, coronary artery disease, GERD, OSA He was diagnosed with asthma 1992 and has been maintained on intermittently on controller medication.  He has been off controller medication for the past 4 years Had a viral-like illness around November, December 2021 with worsening control of asthma, daily wheezing, dyspnea.  Restarted on Symbicort in January with improvement in symptoms.  Uses his rescue medication 2-3 times per week, no nocturnal awakenings  Pets: No pets Occupation: Works as a Equities trader Exposures: No mold, hot tub, Customer service manager.  No feather pillows or comforters Smoking history: 30-pack-year smoker.  Quit in 2007 Travel history: No significant travel history Relevant family history: Father had asthma, lung cancer.  He was a smoker   Outpatient Encounter Medications as of 09/01/2020  Medication Sig  . albuterol (VENTOLIN HFA) 108 (90 Base) MCG/ACT inhaler Inhale 2 puffs into the lungs every 6 (six) hours as needed for wheezing or shortness of breath.  . BESIVANCE 0.6 % SUSP Place 1 drop into the left eye as directed.  . budesonide-formoterol (SYMBICORT) 160-4.5 MCG/ACT inhaler Inhale 2 puffs into the lungs 2 (two) times daily.  Marland Kitchen buPROPion (WELLBUTRIN XL) 150 MG 24 hr tablet Take 150 mg by mouth daily.  . citalopram (CELEXA) 40 MG tablet Take 40 mg by mouth daily.  . clobetasol cream (TEMOVATE) 7.58 % Apply 1 application topically as needed.  Marland Kitchen ibuprofen (ADVIL) 800 MG tablet Take 800 mg by mouth as needed.  . isosorbide mononitrate (IMDUR) 60 MG 24 hr tablet Take 1 tablet (60 mg total) by mouth daily.  Marland Kitchen losartan (COZAAR) 50 MG tablet Take 1 tablet (50 mg total) by mouth daily.  . Multiple Vitamin (MULTIVITAMIN) tablet  Take 1 tablet by mouth daily.  . nitroGLYCERIN (NITROLINGUAL) 0.4 MG/SPRAY spray Place 1 spray under the tongue every 5 (five) minutes x 3 doses as needed for chest pain. Pt must keep upcoming appt before anymore refills. Final Attempt  . pantoprazole (PROTONIX) 40 MG tablet Take 40 mg by mouth daily.  . SUMAtriptan (IMITREX) 25 MG tablet Take 1 tablet (25 mg total) by mouth every 2 (two) hours as needed for migraine. May repeat in 2 hours if headache persists or recurs.  . traZODone (DESYREL) 50 MG tablet Take 50-100 mg by mouth at bedtime as needed.  Marland Kitchen glucosamine-chondroitin 500-400 MG tablet Take 1 tablet by mouth 3 (three) times daily.  . nitroGLYCERIN (NITROSTAT) 0.4 MG SL tablet Place 1 tablet (0.4 mg total) under the tongue every 5 (five) minutes as needed for chest pain.  . rosuvastatin (CRESTOR) 20 MG tablet Take 1 tablet (20 mg total) by mouth daily.   No facility-administered encounter medications on file as of 09/01/2020.    Allergies as of 09/01/2020 - Review Complete 09/01/2020  Allergen Reaction Noted  . Ansaid [ocufen] Nausea And Vomiting 08/07/2011  . Chantix [varenicline tartrate] Other (See Comments) 08/04/2011  . Codeine Other (See Comments) 08/04/2011  . Erythromycin Other (See Comments) 08/04/2011    Past Medical History:  Diagnosis Date  . Asthma   . Coronary artery disease CARDIOLOGIST - DR Daneen Schick--- LAST VISIT OCT 2012   DENIES S & S  . Depression   . Dysuria   . GERD (  gastroesophageal reflux disease)   . H/O hiatal hernia   . History of angina pectoris   . Hypertension   . Kidney calculi   . Left ureteral calculus   . Myocardial bridge MID LAD  . Shortness of breath    with excertion    Past Surgical History:  Procedure Laterality Date  . CARDIAC CATHETERIZATION  01-07-2010-- DR Daneen Schick   NORMAL LVF/ PATENT CORONARY ARTERIES/ MYOCARDIAL BRIDGE MID LAD  . CYSTOSCOPY W/ URETERAL STENT PLACEMENT  08/15/2011   Procedure: CYSTOSCOPY WITH STENT  REPLACEMENT;  Surgeon: Fredricka Bonine, MD;  [facility];  Service: Urology;  Laterality: Left;  stone basket extraction left, left ureteroscopy  . EXTRACORPOREAL SHOCK WAVE LITHOTRIPSY  08-07-11   LEFT  . VERICOCELECTOMY  1992    Family History  Problem Relation Age of Onset  . Emphysema Paternal Grandfather   . Aortic aneurysm Paternal Grandfather   . Heart attack Brother   . Cancer - Lung Father   . Hypertension Mother   . Hypertension Paternal Grandmother   . Alzheimer's disease Maternal Grandmother   . Stroke Maternal Grandfather     Social History   Socioeconomic History  . Marital status: Married    Spouse name: Not on file  . Number of children: 0  . Years of education: Not on file  . Highest education level: Not on file  Occupational History  . Not on file  Tobacco Use  . Smoking status: Former Smoker    Packs/day: 3.00    Years: 10.00    Pack years: 30.00    Types: Cigarettes, Cigars    Quit date: 08/13/2005    Years since quitting: 15.0  . Smokeless tobacco: Former Network engineer  . Vaping Use: Never used  Substance and Sexual Activity  . Alcohol use: No    Alcohol/week: 0.0 standard drinks  . Drug use: No  . Sexual activity: Not on file  Other Topics Concern  . Not on file  Social History Narrative  . Not on file   Social Determinants of Health   Financial Resource Strain: Not on file  Food Insecurity: Not on file  Transportation Needs: Not on file  Physical Activity: Not on file  Stress: Not on file  Social Connections: Not on file  Intimate Partner Violence: Not on file    Review of systems: Review of Systems  Constitutional: Negative for fever and chills.  HENT: Negative.   Eyes: Negative for blurred vision.  Respiratory: as per HPI  Cardiovascular: Negative for chest pain and palpitations.  Gastrointestinal: Negative for vomiting, diarrhea, blood per rectum. Genitourinary: Negative for dysuria,  urgency, frequency and hematuria.  Musculoskeletal: Negative for myalgias, back pain and joint pain.  Skin: Negative for itching and rash.  Neurological: Negative for dizziness, tremors, focal weakness, seizures and loss of consciousness.  Endo/Heme/Allergies: Negative for environmental allergies.  Psychiatric/Behavioral: Negative for depression, suicidal ideas and hallucinations.  All other systems reviewed and are negative.  Physical Exam: Blood pressure 128/80, pulse 83, temperature 98.1 F (36.7 C), temperature source Temporal, height 5' 9"  (1.753 m), weight 212 lb (96.2 kg), SpO2 96 %. Gen:      No acute distress HEENT:  EOMI, sclera anicteric Neck:     No masses; no thyromegaly Lungs:    Clear to auscultation bilaterally; normal respiratory effort CV:         Regular rate and rhythm; no murmurs Abd:      +  bowel sounds; soft, non-tender; no palpable masses, no distension Ext:    No edema; adequate peripheral perfusion Skin:      Warm and dry; no rash Neuro: alert and oriented x 3 Psych: normal mood and affect  Data Reviewed: Imaging: Chest x-ray 10/12/2015-chronic pleural-parenchymal scarring.  I have reviewed the images personally.  PFTs: 01/06/2016 FVC 4.76 [94%], FEV1 3.76 [95%], F/F 79, TLC 7.23 (7%], DLCO 31.86 [102%] Normal PFTs  Labs: CBC 01/05/2017-WBC 5.8, eos 2%, absolute eosinophil count 118  Assessment:  Mild persistent asthma He had a flareup recently after what appears to be a viral illness He is now on Symbicort as a controller medication with improvement We will continue this for now.  In the future if he remains stable we can transition to Symbicort as needed under single maintenance and reliever therapy (SMART) strategy  Check chest x-ray, PFTs, CBC differential and IgE for baseline evaluation  OSA On CPAP at 8 Download reviewed with good compliance and and response  Plan/Recommendations: Continue Symbicort, CPAP Chest x-ray, PFTs, labs  Marshell Garfinkel MD [facility] 09/01/2020, 9:03 AM  CC: Kathyrn Lass, MD

## 2020-09-01 NOTE — Patient Instructions (Signed)
I am glad you are feeling better with regard to your breathing Continue the Symbicort regularly twice a day for the next 1 to 2 months.  After that he can start using it as needed We will renew the prescription for the inhalers  We will check baseline labs including CBC with differential, IgE Chest x-ray and PFTs  Follow-up in 6 months

## 2020-09-02 LAB — IGE: IgE (Immunoglobulin E), Serum: 8 kU/L (ref <=114)

## 2020-09-03 ENCOUNTER — Other Ambulatory Visit (HOSPITAL_BASED_OUTPATIENT_CLINIC_OR_DEPARTMENT_OTHER): Payer: Self-pay

## 2020-09-03 ENCOUNTER — Ambulatory Visit (INDEPENDENT_AMBULATORY_CARE_PROVIDER_SITE_OTHER): Payer: 59 | Admitting: Pulmonary Disease

## 2020-09-03 ENCOUNTER — Other Ambulatory Visit: Payer: Self-pay

## 2020-09-03 DIAGNOSIS — R0600 Dyspnea, unspecified: Secondary | ICD-10-CM | POA: Diagnosis not present

## 2020-09-03 LAB — PULMONARY FUNCTION TEST
DL/VA % pred: 93 %
DL/VA: 4.19 ml/min/mmHg/L
DLCO cor % pred: 96 %
DLCO cor: 27.68 ml/min/mmHg
DLCO unc % pred: 95 %
DLCO unc: 27.36 ml/min/mmHg
FEF 25-75 Post: 3.15 L/sec
FEF 25-75 Pre: 2.91 L/sec
FEF2575-%Change-Post: 8 %
FEF2575-%Pred-Post: 91 %
FEF2575-%Pred-Pre: 84 %
FEV1-%Change-Post: 1 %
FEV1-%Pred-Post: 93 %
FEV1-%Pred-Pre: 91 %
FEV1-Post: 3.59 L
FEV1-Pre: 3.52 L
FEV1FVC-%Change-Post: 3 %
FEV1FVC-%Pred-Pre: 97 %
FEV6-%Change-Post: 0 %
FEV6-%Pred-Post: 96 %
FEV6-%Pred-Pre: 96 %
FEV6-Post: 4.59 L
FEV6-Pre: 4.6 L
FEV6FVC-%Change-Post: 0 %
FEV6FVC-%Pred-Post: 103 %
FEV6FVC-%Pred-Pre: 102 %
FVC-%Change-Post: -1 %
FVC-%Pred-Post: 93 %
FVC-%Pred-Pre: 94 %
FVC-Post: 4.59 L
FVC-Pre: 4.64 L
Post FEV1/FVC ratio: 78 %
Post FEV6/FVC ratio: 100 %
Pre FEV1/FVC ratio: 76 %
Pre FEV6/FVC Ratio: 99 %
RV % pred: 109 %
RV: 2.14 L
TLC % pred: 102 %
TLC: 6.91 L

## 2020-09-03 NOTE — Progress Notes (Signed)
PFT done today. 

## 2020-09-06 MED FILL — LOSARTAN POTASSIUM 100 MG T: 100 | 90 days supply | Qty: 45 | Fill #1

## 2020-09-07 ENCOUNTER — Encounter: Payer: Self-pay | Admitting: *Deleted

## 2020-09-10 ENCOUNTER — Other Ambulatory Visit: Payer: Self-pay

## 2020-09-10 ENCOUNTER — Encounter (INDEPENDENT_AMBULATORY_CARE_PROVIDER_SITE_OTHER): Payer: 59 | Admitting: Ophthalmology

## 2020-09-10 DIAGNOSIS — H33303 Unspecified retinal break, bilateral: Secondary | ICD-10-CM

## 2020-09-10 DIAGNOSIS — H442A2 Degenerative myopia with choroidal neovascularization, left eye: Secondary | ICD-10-CM | POA: Diagnosis not present

## 2020-09-10 DIAGNOSIS — H35033 Hypertensive retinopathy, bilateral: Secondary | ICD-10-CM

## 2020-09-10 DIAGNOSIS — I10 Essential (primary) hypertension: Secondary | ICD-10-CM | POA: Diagnosis not present

## 2020-09-10 DIAGNOSIS — H4423 Degenerative myopia, bilateral: Secondary | ICD-10-CM | POA: Diagnosis not present

## 2020-09-10 DIAGNOSIS — H43813 Vitreous degeneration, bilateral: Secondary | ICD-10-CM | POA: Diagnosis not present

## 2020-09-21 DIAGNOSIS — G4733 Obstructive sleep apnea (adult) (pediatric): Secondary | ICD-10-CM | POA: Diagnosis not present

## 2020-09-28 ENCOUNTER — Other Ambulatory Visit: Payer: Self-pay

## 2020-09-28 ENCOUNTER — Encounter (INDEPENDENT_AMBULATORY_CARE_PROVIDER_SITE_OTHER): Payer: Self-pay | Admitting: Ophthalmology

## 2020-09-28 ENCOUNTER — Ambulatory Visit (INDEPENDENT_AMBULATORY_CARE_PROVIDER_SITE_OTHER): Payer: 59 | Admitting: Ophthalmology

## 2020-09-28 DIAGNOSIS — Z9889 Other specified postprocedural states: Secondary | ICD-10-CM

## 2020-09-28 DIAGNOSIS — H43812 Vitreous degeneration, left eye: Secondary | ICD-10-CM | POA: Diagnosis not present

## 2020-09-28 DIAGNOSIS — H3581 Retinal edema: Secondary | ICD-10-CM

## 2020-09-28 DIAGNOSIS — H35033 Hypertensive retinopathy, bilateral: Secondary | ICD-10-CM

## 2020-09-28 DIAGNOSIS — H5213 Myopia, bilateral: Secondary | ICD-10-CM | POA: Diagnosis not present

## 2020-09-28 DIAGNOSIS — H35052 Retinal neovascularization, unspecified, left eye: Secondary | ICD-10-CM

## 2020-09-28 DIAGNOSIS — H25813 Combined forms of age-related cataract, bilateral: Secondary | ICD-10-CM | POA: Diagnosis not present

## 2020-09-28 DIAGNOSIS — I10 Essential (primary) hypertension: Secondary | ICD-10-CM

## 2020-09-28 NOTE — Progress Notes (Signed)
[facility] Note  09/28/2020     CHIEF COMPLAINT Patient presents for Retina Evaluation   HISTORY OF PRESENT ILLNESS: [name] is a 50 y.o. male who presents to the clinic today for:   HPI    Retina Evaluation    In left eye.  This started 2 days ago.  Duration of 2 days.  Associated Symptoms Floaters.          Comments    Patient here for Retina Evaluation. Patient states vision for last 2 days started seeing dark black spots, floaters and couple like under microscope. Black bars floating around. Now it is gray moving around. When gets in field of view it is darker. No eye pain.        Last edited by [name], COA on 09/28/2020 10:52 AM. 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 Zigmund Knox, he is s/p lasik OU and states he was very near sighted prior to [name]  Referring physician: Kathyrn Lass, MD [name],  [place] [address]  HISTORICAL INFORMATION:   Selected notes from the [place] pt with new floaters OS LEE:  Ocular Hx- PMH-    CURRENT MEDICATIONS: Current Outpatient Medications (Ophthalmic Drugs)  Medication Sig  . BESIVANCE 0.6 % SUSP Place 1 drop into the left eye as directed.   No current facility-administered medications for this visit. (Ophthalmic Drugs)   Current Outpatient Medications (Other)  Medication Sig  . albuterol (VENTOLIN HFA) 108 (90 Base) MCG/ACT inhaler INHALE 2 PUFFS INTO THE LUNGS EVERY 6 HOURS AS NEEDED FOR WHEEZING OR SHORTNESS OF BREATH.  . budesonide-formoterol (SYMBICORT) 160-4.5 MCG/ACT inhaler  INHALE 2 PUFFS INTO THE LUNGS 2 TIMES DAILY  . buPROPion (WELLBUTRIN XL) 150 MG 24 hr tablet Take 150 mg by mouth daily.  Marland Kitchen buPROPion (WELLBUTRIN XL) 150 MG 24 hr tablet TAKE 1 TABLET BY MOUTH EVERY MORNING  . citalopram (CELEXA) 40 MG tablet Take 40 mg by mouth daily.  . citalopram (CELEXA) 40 MG tablet TAKE 1 TABLET BY MOUTH ONCE DAILY  . clobetasol cream (TEMOVATE) 3.25 % Apply 1 application topically as needed.  Marland Kitchen glucosamine-chondroitin 500-400 MG tablet Take 1 tablet by mouth 3 (three) times daily.  Marland Kitchen ibuprofen (ADVIL) 800 MG tablet Take 800 mg by mouth as needed.  . isosorbide mononitrate (IMDUR) 60 MG 24 hr tablet TAKE 1 TABLET BY MOUTH ONCE A DAY  . losartan (COZAAR) 100 MG tablet TAKE 1/2 TABLET BY MOUTH ONCE A DAY  . losartan (COZAAR) 50 MG tablet Take 1 tablet (50 mg total) by mouth daily.  . Multiple Vitamin (MULTIVITAMIN) tablet Take 1 tablet by mouth daily.  . nitroGLYCERIN (NITROLINGUAL) 0.4 MG/SPRAY spray Place 1 spray under the tongue every 5 (five) minutes x 3 doses as needed for chest pain. Pt must keep upcoming appt before anymore refills. Final Attempt  . nitroGLYCERIN (NITROSTAT) 0.4 MG SL tablet Place 1 tablet (0.4 mg total) under the tongue every 5 (five) minutes as needed for chest pain.  . pantoprazole (PROTONIX) 40 MG tablet Take  40 mg by mouth daily.  . pantoprazole (PROTONIX) 40 MG tablet TAKE 1 TABLET BY MOUTH ONCE A DAY  . rosuvastatin (CRESTOR) 20 MG tablet Take 1 tablet (20 mg total) by mouth daily.  . rosuvastatin (CRESTOR) 20 MG tablet TAKE 1 TABLET BY MOUTH ONCE A DAY  . rosuvastatin (CRESTOR) 20 MG tablet TAKE 1 TABLET BY MOUTH ONCE DAILY  . SUMAtriptan (IMITREX) 25 MG tablet Take 1 tablet (25 mg total) by mouth every 2 (two) hours as needed for migraine. May repeat in 2 hours if headache persists or recurs.  . traZODone (DESYREL) 50 MG tablet Take 50-100 mg by mouth at bedtime as needed.  . traZODone (DESYREL) 50 MG tablet TAKE 1 TO 2 TABLETS BY MOUTH  NIGHTLY.   No current facility-administered medications for this visit. (Other)      REVIEW OF SYSTEMS: ROS    Positive for: Eyes   Negative for: Constitutional, Gastrointestinal, Neurological, Skin, Genitourinary, Musculoskeletal, HENT, Endocrine, Cardiovascular, Respiratory, Psychiatric, Allergic/Imm, Heme/Lymph   Last edited by [name], COA on 09/28/2020 11:13 AM. (History)       ALLERGIES Allergies  Allergen Reactions  . Ansaid [Ocufen] Nausea And Vomiting  . Chantix [Varenicline Tartrate] Other (See Comments)    Gi intolerances   . Codeine Other (See Comments)    Gi intolerance   . Erythromycin Other (See Comments)    Gi intolerances     PAST MEDICAL HISTORY Past Medical History:  Diagnosis Date  . Asthma   . Coronary artery disease CARDIOLOGIST - [name]--- LAST VISIT OCT 2012   DENIES S & S  . Depression   . Dysuria   . GERD (gastroesophageal reflux disease)   . H/O hiatal hernia   . History of angina pectoris   . Hypertension   . Kidney calculi   . Left ureteral calculus   . Myocardial bridge MID LAD  . Shortness of breath    with excertion   Past Surgical History:  Procedure Laterality Date  . CARDIAC CATHETERIZATION  01-07-2010-- [name]   NORMAL LVF/ PATENT CORONARY ARTERIES/ MYOCARDIAL BRIDGE MID LAD  . CYSTOSCOPY W/ URETERAL STENT PLACEMENT  08/15/2011   Procedure: CYSTOSCOPY WITH STENT REPLACEMENT;  Surgeon: Joshua Bonine, MD;  [facility]: [facility];  Service: Urology;  Laterality: Left;  stone basket extraction left, left ureteroscopy  . EXTRACORPOREAL SHOCK WAVE LITHOTRIPSY  08-07-11   LEFT  . VERICOCELECTOMY  1992    FAMILY HISTORY Family History  Problem Relation Age of Onset  . Emphysema Paternal Grandfather   . Aortic aneurysm Paternal Grandfather   . Heart attack Brother   . Cancer - Lung Father   . Hypertension Mother   . Hypertension Paternal Grandmother   . Alzheimer's disease  Maternal Grandmother   . Stroke Maternal Grandfather     SOCIAL HISTORY Social History   Tobacco Use  . Smoking status: Former Smoker    Packs/day: 3.00    Years: 10.00    Pack years: 30.00    Types: Cigarettes, Cigars    Quit date: 08/13/2005    Years since quitting: 15.1  . Smokeless tobacco: Former Network engineer  . Vaping Use: Never used  Substance Use Topics  . Alcohol use: No    Alcohol/week: 0.0 standard drinks  . Drug use: No         OPHTHALMIC EXAM:  Base Eye Exam    Visual Acuity (Snellen - Linear)  Right Left   Dist cc 20/25 -2 20/50 +1   Dist ph cc NI 20/40   Correction: Glasses       Tonometry (Tonopen, 10:47 AM)      Right Left   Pressure 13 12       Pupils      Dark Light Shape React APD   Right 4 3 Round Brisk None   Left 4 3 Round Brisk None       Visual Fields (Counting fingers)      Left Right    Full Full       Extraocular Movement      Right Left    Full, Ortho Full, Ortho       Neuro/Psych    Oriented x3: Yes   Mood/Affect: Normal       Dilation    Both eyes: 1.0% Mydriacyl, 2.5% Phenylephrine @ 10:45 AM        Slit Lamp and Fundus Exam    Slit Lamp Exam      Right Left   Lids/Lashes Dermatochalasis - upper lid Dermatochalasis - upper lid   Conjunctiva/Sclera White and quiet White and quiet   Cornea tear film debris, trace PEE, well healed lasik flap tear film debris, trace PEE, well healed lasik flap   Anterior Chamber Deep and quiet Deep and quiet   Iris Round and dilated Round and dilated   Lens 1-2+ Nuclear sclerosis, 1-2+ Cortical cataract 1-2+ Nuclear sclerosis, 1-2+ Cortical cataract   Vitreous Vitreous syneresis, mild vitreous condensations Vitreous syneresis, no pigment, Posterior vitreous detachment, Weiss ring, vitreous condensations       Fundus Exam      Right Left   Disc mild Pallor, tilted, temporal PPA mild Pallor, tilted, temporal PPA   C/D Ratio 0.4 0.3   Macula Flat, Blunted foveal reflex,  RPE mottling and clumping, No heme or edema Flat, Blunted foveal reflex, RPE mottling and clumping, No heme or edema   Vessels attenuated, mild tortuousity attenuated, mild tortuousity   Periphery Attached, pigmented lattice from 1100-0100 with good laser surrounding; peripheral laser IT periphery--almost to ora, No new RT/RD or lattice Attached, pigmented lattice superiorly from 1100-0130 w/ good laser surrounding, pigmented lattice from 0300-0600 with good laser surrounding, mild paving stone superior periphery; no RT/RD on 360 scleral depression        Refraction    Wearing Rx      Sphere Cylinder Axis Add   Right -3.75 +0.75 179 +2.50   Left -4.25 +1.00 005 +2.50       Manifest Refraction (Auto)      Sphere Cylinder Axis Dist VA   Right       Left -5.25 -1.00 168 20/25-3          IMAGING AND PROCEDURES  Imaging and Procedures for 09/28/2020  OCT, Retina - OU - Both Eyes       Right Eye Quality was good. Central Foveal Thickness: 269. Progression has been stable. Findings include normal foveal contour, no IRF, no SRF, vitreomacular adhesion , myopic contour.   Left Eye Quality was good. Central Foveal Thickness: 279. Progression has been stable. Findings include normal foveal contour, no IRF, no SRF, myopic contour (Interval release of VMA, trace vitreous opacities).   Notes *Images captured and stored on drive  Diagnosis / Impression:  NFP; no IRF/SRF OU Myopic contour OU OS w/ interval release of VMA  Clinical management:  See below  Abbreviations: NFP - Normal foveal profile. CME -  cystoid macular edema. PED - pigment epithelial detachment. IRF - intraretinal fluid. SRF - subretinal fluid. EZ - ellipsoid zone. ERM - epiretinal membrane. ORA - outer retinal atrophy. ORT - outer retinal tubulation. SRHM - subretinal hyper-reflective material. IRHM - intraretinal hyper-reflective material                 ASSESSMENT/PLAN:    ICD-10-CM   1. Posterior  vitreous detachment of left eye  H43.812   2. Retinal edema  H35.81 OCT, Retina - OU - Both Eyes  3. CNVM (choroidal neovascular membrane), left  H35.052   4. Essential hypertension  I10   5. Hypertensive retinopathy of both eyes  H35.033   6. Combined forms of age-related cataract of both eyes  H25.813   7. Severe myopia of both eyes  H52.13   8. Hx of LASIK  Z98.890     1,2. PVD / vitreous syneresis OS  - symptomatic floaters / flashes OS x2 days  - exam with prominent floaters / vit condensations, but no RT or RD on 360 scleral depressed exam OS  - OCT shows interval release of VMA from prior scans  - Discussed findings and prognosis  - Reviewed s/s of RT/RD  - Strict return precautions for any such RT/RD signs/symptoms  - f/u as scheduled with Joshua. Zigmund Knox, sooner prn  3. H/o CNV OS  - under the expert management of JDM  - last IVA OS (09.17.21)  4,5. Hypertensive retinopathy OU - discussed importance of tight BP control - monitor  6. Mixed Cataract OU - The symptoms of cataract, surgical options, and treatments and risks were discussed with patient. - discussed diagnosis and progression - not yet visually significant - monitor for now  7. Myopic degeneration OU  8. Hx of lasik OU   Ophthalmic Meds Ordered this visit:  No orders of the defined types were placed in this encounter.      Return for as scheduled w/ JDM.  There are no Patient Instructions on file for this visit.   Explained the diagnoses, plan, and follow up with the patient and they expressed understanding.  Patient expressed understanding of the importance of proper follow up care.   This document serves as a record of services personally performed by Gardiner Sleeper, MD, PhD. It was created on their behalf by San Jetty. Owens Shark, OA an ophthalmic technician. The creation of this record is the provider's dictation and/or activities during the visit.    Electronically signed by: San Jetty. Marguerita Merles  04.19.2022 12:08 PM   Gardiner Sleeper, M.D., Ph.D. Diseases & Surgery of the Retina and Vitreous [facility]  I have reviewed the above documentation for accuracy and completeness, and I agree with the above. Gardiner Sleeper, M.D., Ph.D. 09/28/20 12:08 PM  Abbreviations: M myopia (nearsighted); A astigmatism; H hyperopia (farsighted); P presbyopia; Mrx spectacle prescription;  CTL contact lenses; OD right eye; OS left eye; OU both eyes  XT exotropia; ET esotropia; PEK punctate epithelial keratitis; PEE punctate epithelial erosions; DES dry eye syndrome; MGD meibomian gland dysfunction; ATs artificial tears; PFAT's preservative free artificial tears; [place] nuclear sclerotic cataract; PSC posterior subcapsular cataract; ERM epi-retinal membrane; PVD posterior vitreous detachment; RD retinal detachment; DM diabetes mellitus; Joshua diabetic retinopathy; NPDR non-proliferative diabetic retinopathy; PDR proliferative diabetic retinopathy; CSME clinically significant macular edema; DME diabetic macular edema; dbh dot blot hemorrhages; CWS cotton wool spot; POAG primary open angle glaucoma; C/D cup-to-disc ratio; HVF humphrey visual  field; GVF goldmann visual field; OCT optical coherence tomography; IOP intraocular pressure; BRVO Branch retinal vein occlusion; CRVO central retinal vein occlusion; CRAO central retinal artery occlusion; BRAO branch retinal artery occlusion; RT retinal tear; SB scleral buckle; PPV pars plana vitrectomy; VH Vitreous hemorrhage; PRP panretinal laser photocoagulation; IVK intravitreal kenalog; VMT vitreomacular traction; MH Macular hole;  NVD neovascularization of the disc; NVE neovascularization elsewhere; AREDS age related eye disease study; ARMD age related macular degeneration; POAG primary open angle glaucoma; EBMD epithelial/anterior basement membrane dystrophy; ACIOL anterior chamber intraocular lens; IOL intraocular lens; PCIOL posterior chamber intraocular  lens; Phaco/IOL phacoemulsification with intraocular lens placement; [place] photorefractive keratectomy; LASIK laser assisted in situ keratomileusis; HTN hypertension; DM diabetes mellitus; COPD chronic obstructive pulmonary disease

## 2020-09-30 ENCOUNTER — Other Ambulatory Visit (HOSPITAL_COMMUNITY): Payer: Self-pay

## 2020-09-30 MED ORDER — TRAZODONE HCL 50 MG PO TABS
ORAL_TABLET | ORAL | 1 refills | Status: DC
  Filled 2020-09-30: qty 60, 30d supply, fill #0

## 2020-09-30 MED FILL — Budesonide-Formoterol Fumarate Dihyd Aerosol 160-4.5 MCG/ACT: RESPIRATORY_TRACT | 30 days supply | Qty: 10.2 | Fill #0 | Status: AC

## 2020-10-01 ENCOUNTER — Other Ambulatory Visit (HOSPITAL_COMMUNITY): Payer: Self-pay

## 2020-10-02 ENCOUNTER — Other Ambulatory Visit (HOSPITAL_COMMUNITY): Payer: Self-pay

## 2020-10-25 ENCOUNTER — Other Ambulatory Visit (HOSPITAL_COMMUNITY): Payer: Self-pay

## 2020-10-25 MED FILL — Pantoprazole Sodium EC Tab 40 MG (Base Equiv): ORAL | 90 days supply | Qty: 90 | Fill #0 | Status: AC

## 2020-10-26 ENCOUNTER — Other Ambulatory Visit (HOSPITAL_COMMUNITY): Payer: Self-pay

## 2020-10-26 MED ORDER — CITALOPRAM HYDROBROMIDE 40 MG PO TABS
ORAL_TABLET | ORAL | 0 refills | Status: DC
Start: 2020-10-26 — End: 2021-01-25
  Filled 2020-10-26: qty 90, 90d supply, fill #0

## 2020-10-27 ENCOUNTER — Other Ambulatory Visit (HOSPITAL_COMMUNITY): Payer: Self-pay

## 2020-11-09 ENCOUNTER — Other Ambulatory Visit (HOSPITAL_COMMUNITY): Payer: Self-pay

## 2020-11-09 MED ORDER — TRAZODONE HCL 50 MG PO TABS
ORAL_TABLET | ORAL | 1 refills | Status: DC
  Filled 2020-11-09: qty 360, 90d supply, fill #0
  Filled 2021-02-24: qty 360, 90d supply, fill #1

## 2020-11-10 ENCOUNTER — Other Ambulatory Visit (HOSPITAL_COMMUNITY): Payer: Self-pay

## 2020-11-12 ENCOUNTER — Encounter (INDEPENDENT_AMBULATORY_CARE_PROVIDER_SITE_OTHER): Payer: 59 | Admitting: Ophthalmology

## 2020-11-12 ENCOUNTER — Other Ambulatory Visit: Payer: Self-pay

## 2020-11-12 DIAGNOSIS — H35033 Hypertensive retinopathy, bilateral: Secondary | ICD-10-CM

## 2020-11-12 DIAGNOSIS — H33303 Unspecified retinal break, bilateral: Secondary | ICD-10-CM | POA: Diagnosis not present

## 2020-11-12 DIAGNOSIS — H442A2 Degenerative myopia with choroidal neovascularization, left eye: Secondary | ICD-10-CM

## 2020-11-12 DIAGNOSIS — H4421 Degenerative myopia, right eye: Secondary | ICD-10-CM | POA: Diagnosis not present

## 2020-11-12 DIAGNOSIS — H43813 Vitreous degeneration, bilateral: Secondary | ICD-10-CM | POA: Diagnosis not present

## 2020-11-12 DIAGNOSIS — I10 Essential (primary) hypertension: Secondary | ICD-10-CM

## 2020-11-15 ENCOUNTER — Other Ambulatory Visit (HOSPITAL_COMMUNITY): Payer: Self-pay

## 2020-11-15 MED FILL — Rosuvastatin Calcium Tab 20 MG: ORAL | 90 days supply | Qty: 90 | Fill #0 | Status: AC

## 2020-11-23 ENCOUNTER — Other Ambulatory Visit: Payer: Self-pay

## 2020-11-23 ENCOUNTER — Ambulatory Visit (INDEPENDENT_AMBULATORY_CARE_PROVIDER_SITE_OTHER): Payer: 59

## 2020-11-23 DIAGNOSIS — Z23 Encounter for immunization: Secondary | ICD-10-CM

## 2020-11-23 NOTE — Progress Notes (Addendum)
   Covid-19 Vaccination Clinic  Name:  MAKALE PINDELL    MRN: [id number] DOB: Sep 07, 1970  11/23/2020  Mr. Tozzi was observed post Covid-19 immunization for 15 minutes without incident. He was provided with Vaccine Information Sheet and instruction to access the V-Safe system.   Mr. Shutes was instructed to call 911 with any severe reactions post vaccine: Difficulty breathing  Swelling of face and throat  A fast heartbeat  A bad rash all over body  Dizziness and weakness   Immunizations Administered     Name Date Dose VIS Date Route   PFIZER Comrnaty(Gray TOP) Covid-19 Vaccine 11/23/2020  3:25 PM 0.3 mL 05/20/2020 Intramuscular   Manufacturer: [employer]   Lot: [id number]   [place]: [phone]

## 2020-12-06 ENCOUNTER — Other Ambulatory Visit (HOSPITAL_COMMUNITY): Payer: Self-pay

## 2020-12-06 MED FILL — Isosorbide Mononitrate Tab ER 24HR 60 MG: ORAL | 90 days supply | Qty: 90 | Fill #0 | Status: AC

## 2020-12-07 ENCOUNTER — Other Ambulatory Visit (HOSPITAL_COMMUNITY): Payer: Self-pay

## 2020-12-07 DIAGNOSIS — Z0000 Encounter for general adult medical examination without abnormal findings: Secondary | ICD-10-CM | POA: Diagnosis not present

## 2020-12-07 DIAGNOSIS — I10 Essential (primary) hypertension: Secondary | ICD-10-CM | POA: Diagnosis not present

## 2020-12-07 DIAGNOSIS — Z6830 Body mass index (BMI) 30.0-30.9, adult: Secondary | ICD-10-CM | POA: Diagnosis not present

## 2020-12-07 DIAGNOSIS — E785 Hyperlipidemia, unspecified: Secondary | ICD-10-CM | POA: Diagnosis not present

## 2020-12-07 DIAGNOSIS — F325 Major depressive disorder, single episode, in full remission: Secondary | ICD-10-CM | POA: Diagnosis not present

## 2020-12-07 DIAGNOSIS — M7989 Other specified soft tissue disorders: Secondary | ICD-10-CM | POA: Diagnosis not present

## 2020-12-07 DIAGNOSIS — K219 Gastro-esophageal reflux disease without esophagitis: Secondary | ICD-10-CM | POA: Diagnosis not present

## 2020-12-07 DIAGNOSIS — R7309 Other abnormal glucose: Secondary | ICD-10-CM | POA: Diagnosis not present

## 2020-12-07 MED ORDER — HYDROCHLOROTHIAZIDE 12.5 MG PO CAPS
ORAL_CAPSULE | ORAL | 1 refills | Status: DC
  Filled 2020-12-07: qty 90, 90d supply, fill #0

## 2020-12-08 ENCOUNTER — Other Ambulatory Visit (HOSPITAL_COMMUNITY): Payer: Self-pay

## 2020-12-15 DIAGNOSIS — G4733 Obstructive sleep apnea (adult) (pediatric): Secondary | ICD-10-CM | POA: Diagnosis not present

## 2020-12-19 DIAGNOSIS — Z1212 Encounter for screening for malignant neoplasm of rectum: Secondary | ICD-10-CM | POA: Diagnosis not present

## 2020-12-19 DIAGNOSIS — Z1211 Encounter for screening for malignant neoplasm of colon: Secondary | ICD-10-CM | POA: Diagnosis not present

## 2020-12-22 DIAGNOSIS — H5213 Myopia, bilateral: Secondary | ICD-10-CM | POA: Diagnosis not present

## 2020-12-22 DIAGNOSIS — Z9889 Other specified postprocedural states: Secondary | ICD-10-CM | POA: Diagnosis not present

## 2020-12-22 DIAGNOSIS — H52203 Unspecified astigmatism, bilateral: Secondary | ICD-10-CM | POA: Diagnosis not present

## 2020-12-22 DIAGNOSIS — H524 Presbyopia: Secondary | ICD-10-CM | POA: Diagnosis not present

## 2020-12-22 DIAGNOSIS — H4423 Degenerative myopia, bilateral: Secondary | ICD-10-CM | POA: Diagnosis not present

## 2021-01-03 ENCOUNTER — Other Ambulatory Visit (HOSPITAL_COMMUNITY): Payer: Self-pay

## 2021-01-03 ENCOUNTER — Other Ambulatory Visit: Payer: Self-pay | Admitting: Interventional Cardiology

## 2021-01-03 MED ORDER — LOSARTAN POTASSIUM 100 MG PO TABS
ORAL_TABLET | Freq: Every day | ORAL | 0 refills | Status: DC
  Filled 2021-01-03: qty 45, 90d supply, fill #0

## 2021-01-03 MED ORDER — BUPROPION HCL ER (XL) 150 MG PO TB24
150.0000 mg | ORAL_TABLET | Freq: Every morning | ORAL | 3 refills | Status: DC
  Filled 2021-01-03: qty 90, 90d supply, fill #0

## 2021-01-04 ENCOUNTER — Other Ambulatory Visit (HOSPITAL_COMMUNITY): Payer: Self-pay

## 2021-01-24 ENCOUNTER — Other Ambulatory Visit (HOSPITAL_COMMUNITY): Payer: Self-pay

## 2021-01-25 ENCOUNTER — Other Ambulatory Visit: Payer: Self-pay

## 2021-01-25 ENCOUNTER — Encounter (INDEPENDENT_AMBULATORY_CARE_PROVIDER_SITE_OTHER): Payer: 59 | Admitting: Ophthalmology

## 2021-01-25 ENCOUNTER — Other Ambulatory Visit (HOSPITAL_COMMUNITY): Payer: Self-pay

## 2021-01-25 DIAGNOSIS — H33303 Unspecified retinal break, bilateral: Secondary | ICD-10-CM | POA: Diagnosis not present

## 2021-01-25 DIAGNOSIS — H4421 Degenerative myopia, right eye: Secondary | ICD-10-CM | POA: Diagnosis not present

## 2021-01-25 DIAGNOSIS — H442A2 Degenerative myopia with choroidal neovascularization, left eye: Secondary | ICD-10-CM

## 2021-01-25 DIAGNOSIS — H43813 Vitreous degeneration, bilateral: Secondary | ICD-10-CM

## 2021-01-25 DIAGNOSIS — I10 Essential (primary) hypertension: Secondary | ICD-10-CM

## 2021-01-25 DIAGNOSIS — H35033 Hypertensive retinopathy, bilateral: Secondary | ICD-10-CM | POA: Diagnosis not present

## 2021-01-25 MED ORDER — PANTOPRAZOLE SODIUM 40 MG PO TBEC
40.0000 mg | DELAYED_RELEASE_TABLET | Freq: Every day | ORAL | 2 refills | Status: DC
  Filled 2021-01-25: qty 90, 90d supply, fill #0
  Filled 2021-05-16: qty 90, 90d supply, fill #1
  Filled 2021-08-07: qty 90, 90d supply, fill #2

## 2021-01-25 MED ORDER — CITALOPRAM HYDROBROMIDE 40 MG PO TABS
40.0000 mg | ORAL_TABLET | Freq: Every day | ORAL | 3 refills | Status: DC
  Filled 2021-01-25: qty 90, 90d supply, fill #0

## 2021-01-27 ENCOUNTER — Other Ambulatory Visit (HOSPITAL_COMMUNITY): Payer: Self-pay

## 2021-01-28 ENCOUNTER — Encounter (INDEPENDENT_AMBULATORY_CARE_PROVIDER_SITE_OTHER): Payer: 59 | Admitting: Ophthalmology

## 2021-02-16 ENCOUNTER — Other Ambulatory Visit (HOSPITAL_COMMUNITY): Payer: Self-pay

## 2021-02-16 MED FILL — Rosuvastatin Calcium Tab 20 MG: ORAL | 90 days supply | Qty: 90 | Fill #1 | Status: AC

## 2021-02-24 ENCOUNTER — Other Ambulatory Visit (HOSPITAL_COMMUNITY): Payer: Self-pay

## 2021-03-02 ENCOUNTER — Other Ambulatory Visit: Payer: Self-pay | Admitting: Interventional Cardiology

## 2021-03-02 ENCOUNTER — Other Ambulatory Visit (HOSPITAL_COMMUNITY): Payer: Self-pay

## 2021-03-02 DIAGNOSIS — Q245 Malformation of coronary vessels: Secondary | ICD-10-CM

## 2021-03-02 DIAGNOSIS — E782 Mixed hyperlipidemia: Secondary | ICD-10-CM

## 2021-03-02 DIAGNOSIS — R079 Chest pain, unspecified: Secondary | ICD-10-CM

## 2021-03-02 DIAGNOSIS — I10 Essential (primary) hypertension: Secondary | ICD-10-CM

## 2021-03-02 MED ORDER — ISOSORBIDE MONONITRATE ER 60 MG PO TB24
60.0000 mg | ORAL_TABLET | Freq: Every day | ORAL | 0 refills | Status: DC
  Filled 2021-03-02: qty 30, 30d supply, fill #0

## 2021-03-04 DIAGNOSIS — M25571 Pain in right ankle and joints of right foot: Secondary | ICD-10-CM | POA: Diagnosis not present

## 2021-03-10 DIAGNOSIS — G4733 Obstructive sleep apnea (adult) (pediatric): Secondary | ICD-10-CM | POA: Diagnosis not present

## 2021-03-29 ENCOUNTER — Other Ambulatory Visit (HOSPITAL_COMMUNITY): Payer: Self-pay

## 2021-03-29 MED ORDER — BUPROPION HCL ER (XL) 150 MG PO TB24
ORAL_TABLET | ORAL | 2 refills | Status: DC
  Filled 2021-03-29: qty 90, 90d supply, fill #0
  Filled 2021-07-03: qty 90, 90d supply, fill #1
  Filled 2021-10-02: qty 90, 90d supply, fill #2

## 2021-03-31 ENCOUNTER — Other Ambulatory Visit (HOSPITAL_COMMUNITY): Payer: Self-pay

## 2021-04-20 ENCOUNTER — Other Ambulatory Visit: Payer: Self-pay | Admitting: Family Medicine

## 2021-04-20 ENCOUNTER — Other Ambulatory Visit (HOSPITAL_COMMUNITY): Payer: Self-pay

## 2021-04-20 ENCOUNTER — Other Ambulatory Visit: Payer: Self-pay | Admitting: Interventional Cardiology

## 2021-04-20 DIAGNOSIS — Z8669 Personal history of other diseases of the nervous system and sense organs: Secondary | ICD-10-CM

## 2021-04-20 MED ORDER — LOSARTAN POTASSIUM 100 MG PO TABS
50.0000 mg | ORAL_TABLET | Freq: Every day | ORAL | 0 refills | Status: DC
  Filled 2021-04-20: qty 15, 30d supply, fill #0

## 2021-04-21 ENCOUNTER — Other Ambulatory Visit (HOSPITAL_COMMUNITY): Payer: Self-pay

## 2021-04-22 ENCOUNTER — Other Ambulatory Visit: Payer: Self-pay

## 2021-04-22 ENCOUNTER — Encounter (INDEPENDENT_AMBULATORY_CARE_PROVIDER_SITE_OTHER): Payer: 59 | Admitting: Ophthalmology

## 2021-04-22 DIAGNOSIS — H2513 Age-related nuclear cataract, bilateral: Secondary | ICD-10-CM

## 2021-04-22 DIAGNOSIS — H43813 Vitreous degeneration, bilateral: Secondary | ICD-10-CM | POA: Diagnosis not present

## 2021-04-22 DIAGNOSIS — H35033 Hypertensive retinopathy, bilateral: Secondary | ICD-10-CM | POA: Diagnosis not present

## 2021-04-22 DIAGNOSIS — H4421 Degenerative myopia, right eye: Secondary | ICD-10-CM

## 2021-04-22 DIAGNOSIS — H33303 Unspecified retinal break, bilateral: Secondary | ICD-10-CM

## 2021-04-22 DIAGNOSIS — H442A2 Degenerative myopia with choroidal neovascularization, left eye: Secondary | ICD-10-CM | POA: Diagnosis not present

## 2021-04-22 DIAGNOSIS — I10 Essential (primary) hypertension: Secondary | ICD-10-CM | POA: Diagnosis not present

## 2021-04-25 ENCOUNTER — Other Ambulatory Visit: Payer: Self-pay | Admitting: Physician Assistant

## 2021-04-25 ENCOUNTER — Other Ambulatory Visit (HOSPITAL_COMMUNITY): Payer: Self-pay

## 2021-04-25 DIAGNOSIS — I10 Essential (primary) hypertension: Secondary | ICD-10-CM

## 2021-04-25 DIAGNOSIS — R079 Chest pain, unspecified: Secondary | ICD-10-CM

## 2021-04-25 DIAGNOSIS — E782 Mixed hyperlipidemia: Secondary | ICD-10-CM

## 2021-04-25 DIAGNOSIS — Q245 Malformation of coronary vessels: Secondary | ICD-10-CM

## 2021-04-25 MED ORDER — ISOSORBIDE MONONITRATE ER 60 MG PO TB24
60.0000 mg | ORAL_TABLET | Freq: Every day | ORAL | 1 refills | Status: DC
  Filled 2021-04-25: qty 90, 90d supply, fill #0

## 2021-04-25 MED ORDER — CITALOPRAM HYDROBROMIDE 40 MG PO TABS
40.0000 mg | ORAL_TABLET | Freq: Every day | ORAL | 1 refills | Status: DC
Start: 2021-04-25 — End: 2021-10-24
  Filled 2021-04-25: qty 90, 90d supply, fill #0
  Filled 2021-07-24: qty 90, 90d supply, fill #1

## 2021-04-25 MED ORDER — LOSARTAN POTASSIUM 100 MG PO TABS
100.0000 mg | ORAL_TABLET | Freq: Every day | ORAL | 1 refills | Status: DC
  Filled 2021-04-25 (×3): qty 90, 90d supply, fill #0
  Filled 2021-07-31: qty 90, 90d supply, fill #1

## 2021-04-25 NOTE — Progress Notes (Signed)
Cardiology Office Note:    Date:  04/26/2021   ID:  [name], DOB 1971-05-15, MRN [id number]  PCP:  Kathyrn Lass, MD  Cardiologist:  Sinclair Grooms, MD   Referring MD: Kathyrn Lass, MD   Chief Complaint  Patient presents with   Coronary Artery Disease   Hypertension   Hyperlipidemia    History of Present Illness:    OCTAVIANO MUKAI is a 50 y.o. male with a hx of recurrent chest pain.  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 Other hx of HLD, depression and GERD.   Also with OSA on CPAP. Recent CP flare with normal Cor CTA 2021.   Doing well.  No significant cardiac issues.  Over the past 12 months since last seeing him, his father-in-law and his mother have died.  This does cause a lot of stress.  Additionally he closed on a house and had to move.  Things are going well.  He has not been on his diet.  He has not been able to exercise regularly.  He understands that this is necessary.  Ran out of isosorbide.  Had no chest pain.  In the interval, losartan was increased to 100 mg/day which she is tolerating well.  He has good blood pressure today in the office but did have isosorbide this morning as well after getting his prescription filled.  Past Medical History:  Diagnosis Date   Asthma    Coronary artery disease CARDIOLOGIST - DR Daneen Schick--- LAST VISIT OCT 2012   DENIES S & S   Depression    Dysuria    GERD (gastroesophageal reflux disease)    H/O hiatal hernia    History of angina pectoris    Hypertension    Kidney calculi    Left ureteral calculus    Myocardial bridge MID LAD   Shortness of breath    with excertion    Past Surgical History:  Procedure Laterality Date   CARDIAC CATHETERIZATION  01-07-2010-- DR Daneen Schick   NORMAL LVF/ PATENT CORONARY ARTERIES/ MYOCARDIAL BRIDGE MID LAD   CYSTOSCOPY W/ URETERAL STENT PLACEMENT  08/15/2011    Procedure: CYSTOSCOPY WITH STENT REPLACEMENT;  Surgeon: Fredricka Bonine, MD;  Location: [facility];  Service: Urology;  Laterality: Left;  stone basket extraction left, left ureteroscopy   EXTRACORPOREAL SHOCK WAVE LITHOTRIPSY  08-07-11   LEFT   VERICOCELECTOMY  1992    Current Medications:    Allergies:   Ansaid [ocufen], Chantix [varenicline tartrate], Codeine, and Erythromycin   Social History   Socioeconomic History   Marital status: Married    Spouse name: Not on file   Number of children: 0   Years of education: Not on file   Highest education level: Not on file  Occupational History   Not on file  Tobacco Use   Smoking status: Former    Packs/day: 3.00    Years: 10.00    Pack years: 30.00    Types: Cigarettes, Cigars    Quit date: 08/13/2005    Years since quitting: 15.7   Smokeless tobacco: Former  Scientific laboratory technician Use: Never used  Substance and Sexual Activity   Alcohol use: No    Alcohol/week: 0.0 standard drinks   Drug use: No   Sexual activity: Not on file  Other Topics Concern   Not on file  Social  History Narrative   Not on file   Social Determinants of Health   Financial Resource Strain: Not on file  Food Insecurity: Not on file  Transportation Needs: Not on file  Physical Activity: Not on file  Stress: Not on file  Social Connections: Not on file     Family History: The patient's family history includes Alzheimer's disease in his maternal grandmother; Aortic aneurysm in his paternal grandfather; Cancer - Lung in his father; Emphysema in his paternal grandfather; Heart attack in his brother; Hypertension in his mother and paternal grandmother; Stroke in his maternal grandfather.  ROS:   Please see the history of present illness.    He denies headache.  Has not needed to use Imitrex.  No sublingual nitroglycerin.  All other systems reviewed and are negative.  EKGs/Labs/Other Studies Reviewed:    The following studies  were reviewed today: No new imaging  EKG:  EKG reveals normal sinus rhythm with nonspecific T wave flattening.  PR interval 148 ms.  Recent Labs: 09/01/2020: Hemoglobin 14.2; Platelets 227.0  Recent Lipid Panel    Component Value Date/Time   CHOL 144 07/30/2019 0956   TRIG 80 07/30/2019 0956   HDL 51 07/30/2019 0956   CHOLHDL 2.8 07/30/2019 0956   CHOLHDL 5.3 (H) 01/12/2017 0819   VLDL 34 (H) 01/12/2017 0819   LDLCALC 78 07/30/2019 0956    Physical Exam:    VS:  BP 122/86   Pulse 81   Ht 5' 9"  (1.753 m)   Wt 219 lb 6.4 oz (99.5 kg)   SpO2 96%   BMI 32.40 kg/m     Wt Readings from Last 3 Encounters:  04/26/21 219 lb 6.4 oz (99.5 kg)  09/01/20 212 lb (96.2 kg)  02/11/20 205 lb 9.6 oz (93.3 kg)     GEN: Overweight and not approximately 14 pounds compared to a year ago.. No acute distress HEENT: Normal NECK: No JVD. LYMPHATICS: No lymphadenopathy CARDIAC: No murmur. RRR no gallop, or edema. VASCULAR:  Normal Pulses. No bruits. RESPIRATORY:  Clear to auscultation without rales, wheezing or rhonchi  ABDOMEN: Soft, non-tender, non-distended, No pulsatile mass, MUSCULOSKELETAL: No deformity  SKIN: Warm and dry NEUROLOGIC:  Alert and oriented x 3 PSYCHIATRIC:  Normal affect   ASSESSMENT:    1. Chest pain of uncertain etiology   2. Essential hypertension   3. Mixed hyperlipidemia   4. Myocardial bridge    PLAN:    In order of problems listed above:  Not having any.  We will try him off of Imdur.  Use sublingual nitroglycerin if chest pain. Target is 130/80.  Good today but had Imdur on board as well.  We will leave Imdur off.  He will monitor blood pressure.  If he has angina or blood pressure is poorly controlled we will have to go to an alternative plan.  We have permanently increase losartan to 100 mg/day.  Basic metabolic panel in 2 to 4 weeks. Continue statin therapy.   Target BP: <130/80 mmHg  Diet and lifestyle measures for BP control were reviewed in  detail: Low sodium diet (<2.5 gm daily); alcohol restriction (<3 ounces per day); weight loss (Mediterranean); avoid non-steroidal agents; > 6 hours sleep per day; 150 min moderate exercise per week. Medical regimen will include at least 2 agents. Resistant hypertension if not controlled on 3 agents. Consider further evaluation: Sleep study to r/o OSA; Renal angiogram; Primary hyperaldonism and Pheochromocytoma w/u. After 3 agents, consider MRA (spironolactone)/ Epleronone), hydralazine, beta-blocker, and  Minoxidil if not already in use due to patient profile.  1 year follow-up   Medication Adjustments/Labs and Tests Ordered: Current medicines are reviewed at length with the patient today.  Concerns regarding medicines are outlined above.  Orders Placed This Encounter  Procedures   EKG 12-Lead   No orders of the defined types were placed in this encounter.   Patient Instructions  Medication Instructions:  1) STAY ON Losartan 145m once daily daily. 2) STOP Imdur for now.  Let uKoreaknow if chest pain comes back and we will restart.  *If you need a refill on your cardiac medications before your next appointment, please call your pharmacy*   Lab Work: BMET 2-4 weeks  If you have labs (blood work) drawn today and your tests are completely normal, you will receive your results only by: [place](if you have MyChart) OR A paper copy in the mail If you have any lab test that is abnormal or we need to change your treatment, we will call you to review the results.   Testing/Procedures: None   Follow-Up: [facility] you and your health needs are our priority.  As part of our continuing mission to provide you with exceptional heart care, we have created designated Provider Care Teams.  These Care Teams include your primary Cardiologist (physician) and Advanced Practice Providers (APPs -  Physician Assistants and Nurse Practitioners) who all work together to provide you with  the care you need, when you need it.  We recommend signing up for the patient portal called "MyChart".  Sign up information is provided on this After Visit Summary.  MyChart is used to connect with patients for Virtual Visits (Telemedicine).  Patients are able to view lab/test results, encounter notes, upcoming appointments, etc.  Non-urgent messages can be sent to your provider as well.   To learn more about what you can do with MyChart, go to [email or web address]    Your next appointment:   1 year(s)  The format for your next appointment:   In Person  Provider:   HSinclair Grooms MD     Other Instructions     Signed, HSinclair Grooms MD  04/26/2021 3:45 PM    [place]

## 2021-04-26 ENCOUNTER — Encounter: Payer: Self-pay | Admitting: Interventional Cardiology

## 2021-04-26 ENCOUNTER — Ambulatory Visit (INDEPENDENT_AMBULATORY_CARE_PROVIDER_SITE_OTHER): Payer: 59 | Admitting: Interventional Cardiology

## 2021-04-26 ENCOUNTER — Other Ambulatory Visit: Payer: Self-pay

## 2021-04-26 VITALS — BP 122/86 | HR 81 | Ht 69.0 in | Wt 219.4 lb

## 2021-04-26 DIAGNOSIS — Q245 Malformation of coronary vessels: Secondary | ICD-10-CM | POA: Diagnosis not present

## 2021-04-26 DIAGNOSIS — E782 Mixed hyperlipidemia: Secondary | ICD-10-CM | POA: Diagnosis not present

## 2021-04-26 DIAGNOSIS — I10 Essential (primary) hypertension: Secondary | ICD-10-CM | POA: Diagnosis not present

## 2021-04-26 DIAGNOSIS — R079 Chest pain, unspecified: Secondary | ICD-10-CM | POA: Diagnosis not present

## 2021-04-26 NOTE — Patient Instructions (Addendum)
Medication Instructions:  1) STAY ON Losartan 173m once daily daily. 2) STOP Imdur for now.  Let [place] if chest pain comes back and we will restart.  *If you need a refill on your cardiac medications before your next appointment, please call your pharmacy*   Lab Work: BMET 2-4 weeks  If you have labs (blood work) drawn today and your tests are completely normal, you will receive your results only by: [place](if you have MyChart) OR A paper copy in the mail If you have any lab test that is abnormal or we need to change your treatment, we will call you to review the results.   Testing/Procedures: None   Follow-Up: [facility] and your health needs are our priority.  As part of our continuing mission to provide you with exceptional heart care, we have created designated Provider Care Teams.  These Care Teams include your primary Cardiologist (physician) and Advanced Practice Providers (APPs -  Physician Assistants and Nurse Practitioners) who all work together to provide you with the care you need, when you need it.  We recommend signing up for the patient portal called "MyChart".  Sign up information is provided on this After Visit Summary.  MyChart is used to connect with patients for Virtual Visits (Telemedicine).  Patients are able to view lab/test results, encounter notes, upcoming appointments, etc.  Non-urgent messages can be sent to your provider as well.   To learn more about what you can do with MyChart, go to [email or web address]    Your next appointment:   1 year(s)  The format for your next appointment:   In Person  Provider:   HSinclair Grooms MD     Other Instructions

## 2021-04-26 NOTE — Addendum Note (Signed)
Addended by: Loren Racer on: 04/26/2021 04:10 PM   Modules accepted: Orders

## 2021-05-02 ENCOUNTER — Encounter: Payer: Self-pay | Admitting: Interventional Cardiology

## 2021-05-04 ENCOUNTER — Other Ambulatory Visit: Payer: Self-pay | Admitting: *Deleted

## 2021-05-04 ENCOUNTER — Other Ambulatory Visit (HOSPITAL_COMMUNITY): Payer: Self-pay

## 2021-05-04 MED ORDER — AMLODIPINE BESYLATE 5 MG PO TABS
5.0000 mg | ORAL_TABLET | Freq: Every day | ORAL | 3 refills | Status: DC
  Filled 2021-05-04: qty 90, 90d supply, fill #0
  Filled 2021-07-24: qty 90, 90d supply, fill #1
  Filled 2021-10-23: qty 90, 90d supply, fill #2
  Filled 2022-01-21: qty 90, 90d supply, fill #3

## 2021-05-16 ENCOUNTER — Other Ambulatory Visit (HOSPITAL_COMMUNITY): Payer: Self-pay

## 2021-05-16 MED ORDER — ROSUVASTATIN CALCIUM 20 MG PO TABS
20.0000 mg | ORAL_TABLET | Freq: Every day | ORAL | 1 refills | Status: DC
  Filled 2021-05-16: qty 90, 90d supply, fill #0
  Filled 2021-08-14: qty 90, 90d supply, fill #1

## 2021-05-18 ENCOUNTER — Other Ambulatory Visit: Payer: 59 | Admitting: *Deleted

## 2021-05-18 ENCOUNTER — Other Ambulatory Visit (HOSPITAL_COMMUNITY): Payer: Self-pay

## 2021-05-18 ENCOUNTER — Other Ambulatory Visit: Payer: Self-pay

## 2021-05-18 DIAGNOSIS — I10 Essential (primary) hypertension: Secondary | ICD-10-CM | POA: Diagnosis not present

## 2021-05-18 LAB — BASIC METABOLIC PANEL
BUN/Creatinine Ratio: 17 (ref 9–20)
BUN: 19 mg/dL (ref 6–24)
CO2: 25 mmol/L (ref 20–29)
Calcium: 9.6 mg/dL (ref 8.7–10.2)
Chloride: 99 mmol/L (ref 96–106)
Creatinine, Ser: 1.15 mg/dL (ref 0.76–1.27)
Glucose: 117 mg/dL — ABNORMAL HIGH (ref 70–99)
Potassium: 4.1 mmol/L (ref 3.5–5.2)
Sodium: 139 mmol/L (ref 134–144)
eGFR: 78 mL/min/{1.73_m2} (ref >59)

## 2021-05-20 ENCOUNTER — Encounter: Payer: Self-pay | Admitting: Interventional Cardiology

## 2021-05-23 DIAGNOSIS — R7309 Other abnormal glucose: Secondary | ICD-10-CM | POA: Diagnosis not present

## 2021-05-23 DIAGNOSIS — R7301 Impaired fasting glucose: Secondary | ICD-10-CM | POA: Diagnosis not present

## 2021-05-25 ENCOUNTER — Ambulatory Visit: Payer: 59

## 2021-05-25 ENCOUNTER — Other Ambulatory Visit: Payer: Self-pay

## 2021-06-02 DIAGNOSIS — G4733 Obstructive sleep apnea (adult) (pediatric): Secondary | ICD-10-CM | POA: Diagnosis not present

## 2021-07-04 ENCOUNTER — Other Ambulatory Visit (HOSPITAL_COMMUNITY): Payer: Self-pay

## 2021-07-15 ENCOUNTER — Encounter (INDEPENDENT_AMBULATORY_CARE_PROVIDER_SITE_OTHER): Payer: 59 | Admitting: Ophthalmology

## 2021-07-22 ENCOUNTER — Encounter (INDEPENDENT_AMBULATORY_CARE_PROVIDER_SITE_OTHER): Payer: 59 | Admitting: Ophthalmology

## 2021-07-22 ENCOUNTER — Other Ambulatory Visit: Payer: Self-pay

## 2021-07-22 DIAGNOSIS — H4421 Degenerative myopia, right eye: Secondary | ICD-10-CM

## 2021-07-22 DIAGNOSIS — H442A2 Degenerative myopia with choroidal neovascularization, left eye: Secondary | ICD-10-CM | POA: Diagnosis not present

## 2021-07-22 DIAGNOSIS — H33303 Unspecified retinal break, bilateral: Secondary | ICD-10-CM

## 2021-07-22 DIAGNOSIS — I10 Essential (primary) hypertension: Secondary | ICD-10-CM | POA: Diagnosis not present

## 2021-07-22 DIAGNOSIS — H35033 Hypertensive retinopathy, bilateral: Secondary | ICD-10-CM | POA: Diagnosis not present

## 2021-07-22 DIAGNOSIS — H43813 Vitreous degeneration, bilateral: Secondary | ICD-10-CM

## 2021-07-25 ENCOUNTER — Other Ambulatory Visit (HOSPITAL_COMMUNITY): Payer: Self-pay

## 2021-07-31 ENCOUNTER — Other Ambulatory Visit (HOSPITAL_COMMUNITY): Payer: Self-pay

## 2021-08-01 ENCOUNTER — Other Ambulatory Visit (HOSPITAL_COMMUNITY): Payer: Self-pay

## 2021-08-07 ENCOUNTER — Other Ambulatory Visit (HOSPITAL_COMMUNITY): Payer: Self-pay

## 2021-08-08 ENCOUNTER — Other Ambulatory Visit (HOSPITAL_COMMUNITY): Payer: Self-pay

## 2021-08-14 ENCOUNTER — Other Ambulatory Visit (HOSPITAL_COMMUNITY): Payer: Self-pay

## 2021-08-15 ENCOUNTER — Other Ambulatory Visit (HOSPITAL_COMMUNITY): Payer: Self-pay

## 2021-08-25 DIAGNOSIS — G4733 Obstructive sleep apnea (adult) (pediatric): Secondary | ICD-10-CM | POA: Diagnosis not present

## 2021-09-07 ENCOUNTER — Telehealth: Payer: 59 | Admitting: Physician Assistant

## 2021-09-07 DIAGNOSIS — J020 Streptococcal pharyngitis: Secondary | ICD-10-CM | POA: Diagnosis not present

## 2021-09-07 MED ORDER — AMOXICILLIN 500 MG PO CAPS: 500.0000 mg | ORAL_CAPSULE | Freq: Two times a day (BID) | ORAL | 0 refills | Status: AC

## 2021-09-07 NOTE — Patient Instructions (Signed)
?[name], thank you for joining [name], PA-C for today's virtual visit.  While this provider is not your primary care provider (PCP), if your PCP is located in our provider database this encounter information will be shared with them immediately following your visit. ? ?Consent: ?(Patient) [name] provided verbal consent for this virtual visit at the beginning of the encounter. ? ?Current Medications: ? ?Current Outpatient Medications:  ?  amoxicillin (AMOXIL) 500 MG capsule, Take 1 capsule (500 mg total) by mouth 2 (two) times daily for 10 days., Disp: 20 capsule, Rfl: 0 ?  albuterol (VENTOLIN HFA) 108 (90 Base) MCG/ACT inhaler, INHALE 2 PUFFS INTO THE LUNGS EVERY 6 HOURS AS NEEDED FOR WHEEZING OR SHORTNESS OF BREATH., Disp: 18 g, Rfl: 5 ?  amLODipine (NORVASC) 5 MG tablet, Take 1 tablet (5 mg total) by mouth daily., Disp: 90 tablet, Rfl: 3 ?  BESIVANCE 0.6 % SUSP, Place 1 drop into the left eye as directed., Disp: , Rfl:  ?  budesonide-formoterol (SYMBICORT) 160-4.5 MCG/ACT inhaler, INHALE 2 PUFFS INTO THE LUNGS 2 TIMES DAILY, Disp: 10.2 g, Rfl: 5 ?  buPROPion (WELLBUTRIN XL) 150 MG 24 hr tablet, Take 150 mg by mouth daily., Disp: , Rfl:  ?  buPROPion (WELLBUTRIN XL) 150 MG 24 hr tablet, Take 1 tablet by mouth every morning., Disp: 90 tablet, Rfl: 2 ?  citalopram (CELEXA) 40 MG tablet, Take 1 tablet by mouth once a day, Disp: 90 tablet, Rfl: 1 ?  clobetasol cream (TEMOVATE) 1.22 %, Apply 1 application topically as needed., Disp: , Rfl:  ?  glucosamine-chondroitin 500-400 MG tablet, Take 1 tablet by mouth 3 (three) times daily., Disp: , Rfl:  ?  hydrochlorothiazide (MICROZIDE) 12.5 MG capsule, Take 1 capsule by mouth once daily in the morning, Disp: 90 capsule, Rfl: 1 ?  ibuprofen (ADVIL) 800 MG tablet, Take 800 mg by mouth as needed., Disp: , Rfl:  ?  losartan (COZAAR) 100 MG tablet, Take 1 tablet (100 mg total) by mouth daily., Disp: 90 tablet, Rfl: 1 ?  Multiple Vitamin  (MULTIVITAMIN) tablet, Take 1 tablet by mouth daily., Disp: , Rfl:  ?  nitroGLYCERIN (NITROLINGUAL) 0.4 MG/SPRAY spray, Place 1 spray under the tongue every 5 (five) minutes x 3 doses as needed for chest pain. Pt must keep upcoming appt before anymore refills. Final Attempt, Disp: 12 g, Rfl: 1 ?  nitroGLYCERIN (NITROSTAT) 0.4 MG SL tablet, Place 1 tablet (0.4 mg total) under the tongue every 5 (five) minutes as needed for chest pain., Disp: 25 tablet, Rfl: 0 ?  pantoprazole (PROTONIX) 40 MG tablet, Take 40 mg by mouth daily., Disp: , Rfl:  ?  pantoprazole (PROTONIX) 40 MG tablet, TAKE 1 TABLET BY MOUTH ONCE A DAY, Disp: 90 tablet, Rfl: 2 ?  rosuvastatin (CRESTOR) 20 MG tablet, Take 20 mg by mouth daily., Disp: , Rfl:  ?  rosuvastatin (CRESTOR) 20 MG tablet, TAKE 1 TABLET BY MOUTH ONCE DAILY, Disp: 90 tablet, Rfl: 1 ?  SUMAtriptan (IMITREX) 25 MG tablet, Take 1 tablet (25 mg total) by mouth every 2 (two) hours as needed for migraine. May repeat in 2 hours if headache persists or recurs., Disp: 10 tablet, Rfl: 6 ?  traZODone (DESYREL) 50 MG tablet, Take 50-100 mg by mouth at bedtime as needed., Disp: , Rfl:   ? ?Medications ordered in this encounter:  ?Meds ordered this encounter  ?Medications  ? amoxicillin (AMOXIL) 500 MG capsule  ?  Sig: Take 1 capsule (  500 mg total) by mouth 2 (two) times daily for 10 days.  ?  Dispense:  20 capsule  ?  Refill:  0  ?  Order Specific Question:   Supervising Provider  ?  Answer:   Noemi Chapel [[id number]]  ?  ? ?*If you need refills on other medications prior to your next appointment, please contact your pharmacy* ? ?Follow-Up: ?Call back or seek an in-person evaluation if the symptoms worsen or if the condition fails to improve as anticipated. ? ?Other Instructions ?Strep Throat, Adult ?Strep throat is an infection in the throat that is caused by bacteria. It is common during the cold months of the year. It mostly affects children who are 74-40 years old. However, people of all ages can  get it at any time of the year. This infection spreads from person to person (is contagious) through coughing, sneezing, or having close contact. ?Your health care provider may use other names to describe the infection. When strep throat affects the tonsils, it is called tonsillitis. When it affects the back of the throat, it is called pharyngitis. ?What are the causes? ?This condition is caused by the Streptococcus pyogenes bacteria. ?What increases the risk? ?You are more likely to develop this condition if: ?You care for school-age children, or are around school-age children. Children are more likely to get strep throat and may spread it to others. ?You spend time in crowded places where the infection can spread easily. ?You have close contact with someone who has strep throat. ?What are the signs or symptoms? ?Symptoms of this condition include: ?Fever or chills. ?Redness, swelling, or pain in the tonsils or throat. ?Pain or difficulty when swallowing. ?White or yellow spots on the tonsils or throat. ?Tender glands in the neck and under the jaw. ?Bad smelling breath. ?Red rash all over the body. This is rare. ?How is this diagnosed? ?This condition is diagnosed by tests that check for the presence and the amount of bacteria that cause strep throat. They are: ?Rapid strep test. Your throat is swabbed and checked for the presence of bacteria. Results are usually ready in minutes. ?Throat culture test. Your throat is swabbed. The sample is placed in a cup that allows infections to grow. Results are usually ready in 1 or 2 days. ?How is this treated? ?This condition may be treated with: ?Medicines that kill germs (antibiotics). ?Medicines that relieve pain or fever. These include: ?Ibuprofen or acetaminophen. ?Aspirin, only for people who are over the age of 53. ?Throat lozenges. ?Throat sprays. ?Follow these instructions at home: ?Medicines ? ?Take over-the-counter and prescription medicines only as told by your  health care provider. ?Take your antibiotic medicine as told by your health care provider. Do not stop taking the antibiotic even if you start to feel better. ?Eating and drinking ? ?If you have trouble swallowing, try eating soft foods until your sore throat feels better. ?Drink enough fluid to keep your urine pale yellow. ?To help relieve pain, you may have: ?Warm fluids, such as soup and tea. ?Cold fluids, such as frozen desserts or popsicles. ?General instructions ?Gargle with a salt-water mixture 3-4 times a day or as needed. To make a salt-water mixture, completely dissolve ?-1 tsp (3-6 g) of salt in 1 cup (237 mL) of warm water. ?Get plenty of rest. ?Stay home from work or school until you have been taking antibiotics for 24 hours. ?Do not use any products that contain nicotine or tobacco. These products include cigarettes, chewing tobacco, and  vaping devices, such as e-cigarettes. If you need help quitting, ask your health care provider. ?It is up to you to get your test results. Ask your health care provider, or the department that is doing the test, when your results will be ready. ?Keep all follow-up visits. This is important. ?How is this prevented? ? ?Do not share food, drinking cups, or personal items that could cause the infection to spread to other people. ?Wash your hands often with soap and water for at least 20 seconds. If soap and water are not available, use hand sanitizer. Make sure that all people in your house wash their hands well. ?Have family members tested if they have a sore throat or fever. They may need an antibiotic if they have strep throat. ?Contact a health care provider if: ?You have swelling in your neck that keeps getting bigger. ?You develop a rash, cough, or earache. ?You cough up a thick mucus that is green, yellow-brown, or bloody. ?You have pain or discomfort that does not get better with medicine. ?Your symptoms seem to be getting worse. ?You have a fever. ?Get help right  away if: ?You have new symptoms, such as vomiting, severe headache, stiff or painful neck, chest pain, or shortness of breath. ?You have severe throat pain, drooling, or changes in your voice. ?You have swelli

## 2021-09-07 NOTE — Progress Notes (Signed)
?Virtual Visit Consent  ? ?[name], you are scheduled for a virtual visit with a [name] provider today.   ?  ?Just as with appointments in the office, your consent must be obtained to participate.  Your consent will be active for this visit and any virtual visit you may have with one of our providers in the next 365 days.   ?  ?If you have a MyChart account, a copy of this consent can be sent to you electronically.  All virtual visits are billed to your insurance company just like a traditional visit in the office.   ? ?As this is a virtual visit, video technology does not allow for your provider to perform a traditional examination.  This may limit your provider's ability to fully assess your condition.  If your provider identifies any concerns that need to be evaluated in person or the need to arrange testing (such as labs, EKG, etc.), we will make arrangements to do so.   ?  ?Although advances in technology are sophisticated, we cannot ensure that it will always work on either your end or our end.  If the connection with a video visit is poor, the visit may have to be switched to a telephone visit.  With either a video or telephone visit, we are not always able to ensure that we have a secure connection.    ? ?I need to obtain your verbal consent now.   Are you willing to proceed with your visit today?  ?  ?[name] has provided verbal consent on 09/07/2021 for a virtual visit (video or telephone). ?  ?[name], PA-C  ? ?Date: 09/07/2021 7:51 AM ? ? ?Virtual Visit via Video Note  ? ?IMar Daring, connected with  [name]  ([id number], 03/09/1971) on 09/07/21 at  7:45 AM EDT by a video-enabled telemedicine application and verified that I am speaking with the correct person using two identifiers. ? ?Location: ?Patient: Virtual Visit Location Patient: Home ?Provider: Virtual Visit Location Provider: Home Office ?  ?I discussed the limitations of evaluation and  management by telemedicine and the availability of in person appointments. The patient expressed understanding and agreed to proceed.   ? ?History of Present Illness: ?[name] is a 51 y.o. who identifies as a male who was assigned male at birth, and is being seen today for sore throat. ? ?HPI: Sore Throat  ?This is a new problem. The current episode started in the past 7 days (Monday). The problem has been gradually worsening. There has been no fever. The pain is moderate. Associated symptoms include congestion, coughing, headaches and swollen glands. Pertinent negatives include no ear discharge, ear pain, neck pain, shortness of breath or trouble swallowing. Associated symptoms comments: chills. He has had no exposure to strep or mono. Exposure to: unknown. He has tried acetaminophen and NSAIDs for the symptoms. The treatment provided no relief.   ? ? ?Problems:  ?Patient Active Problem List  ? Diagnosis Date Noted  ? Coronary artery calcification seen on CAT scan 03/16/2017  ? Dyspnea 10/12/2015  ? OSA on CPAP 04/29/2014  ? 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 Hyperlipidemia 03/12/2013  ? Essential hypertension 03/12/2013  ?  ?Allergies:  ?Allergies  ?Allergen Reactions  ? Ansaid [Ocufen] Nausea And Vomiting  ? Chantix [Varenicline Tartrate] Other (See Comments)  ?  Gi intolerances   ? Codeine Other (See Comments)  ?  Gi  intolerance   ? Erythromycin Other (See Comments)  ?  Gi intolerances ?  ? ?Medications:  ?Current Outpatient Medications:  ?  amoxicillin (AMOXIL) 500 MG capsule, Take 1 capsule (500 mg total) by mouth 2 (two) times daily for 10 days., Disp: 20 capsule, Rfl: 0 ?  albuterol (VENTOLIN HFA) 108 (90 Base) MCG/ACT inhaler, INHALE 2 PUFFS INTO THE LUNGS EVERY 6 HOURS AS NEEDED FOR WHEEZING OR SHORTNESS OF BREATH., Disp: 18 g, Rfl: 5 ?  amLODipine (NORVASC) 5 MG tablet, Take 1 tablet (5 mg total) by mouth daily., Disp: 90 tablet, Rfl: 3 ?  BESIVANCE 0.6 % SUSP, Place 1  drop into the left eye as directed., Disp: , Rfl:  ?  budesonide-formoterol (SYMBICORT) 160-4.5 MCG/ACT inhaler, INHALE 2 PUFFS INTO THE LUNGS 2 TIMES DAILY, Disp: 10.2 g, Rfl: 5 ?  buPROPion (WELLBUTRIN XL) 150 MG 24 hr tablet, Take 150 mg by mouth daily., Disp: , Rfl:  ?  buPROPion (WELLBUTRIN XL) 150 MG 24 hr tablet, Take 1 tablet by mouth every morning., Disp: 90 tablet, Rfl: 2 ?  citalopram (CELEXA) 40 MG tablet, Take 1 tablet by mouth once a day, Disp: 90 tablet, Rfl: 1 ?  clobetasol cream (TEMOVATE) 6.72 %, Apply 1 application topically as needed., Disp: , Rfl:  ?  glucosamine-chondroitin 500-400 MG tablet, Take 1 tablet by mouth 3 (three) times daily., Disp: , Rfl:  ?  hydrochlorothiazide (MICROZIDE) 12.5 MG capsule, Take 1 capsule by mouth once daily in the morning, Disp: 90 capsule, Rfl: 1 ?  ibuprofen (ADVIL) 800 MG tablet, Take 800 mg by mouth as needed., Disp: , Rfl:  ?  losartan (COZAAR) 100 MG tablet, Take 1 tablet (100 mg total) by mouth daily., Disp: 90 tablet, Rfl: 1 ?  Multiple Vitamin (MULTIVITAMIN) tablet, Take 1 tablet by mouth daily., Disp: , Rfl:  ?  nitroGLYCERIN (NITROLINGUAL) 0.4 MG/SPRAY spray, Place 1 spray under the tongue every 5 (five) minutes x 3 doses as needed for chest pain. Pt must keep upcoming appt before anymore refills. Final Attempt, Disp: 12 g, Rfl: 1 ?  nitroGLYCERIN (NITROSTAT) 0.4 MG SL tablet, Place 1 tablet (0.4 mg total) under the tongue every 5 (five) minutes as needed for chest pain., Disp: 25 tablet, Rfl: 0 ?  pantoprazole (PROTONIX) 40 MG tablet, Take 40 mg by mouth daily., Disp: , Rfl:  ?  pantoprazole (PROTONIX) 40 MG tablet, TAKE 1 TABLET BY MOUTH ONCE A DAY, Disp: 90 tablet, Rfl: 2 ?  rosuvastatin (CRESTOR) 20 MG tablet, Take 20 mg by mouth daily., Disp: , Rfl:  ?  rosuvastatin (CRESTOR) 20 MG tablet, TAKE 1 TABLET BY MOUTH ONCE DAILY, Disp: 90 tablet, Rfl: 1 ?  SUMAtriptan (IMITREX) 25 MG tablet, Take 1 tablet (25 mg total) by mouth every 2 (two) hours as  needed for migraine. May repeat in 2 hours if headache persists or recurs., Disp: 10 tablet, Rfl: 6 ?  traZODone (DESYREL) 50 MG tablet, Take 50-100 mg by mouth at bedtime as needed., Disp: , Rfl:  ? ?Observations/Objective: ?Patient is well-developed, well-nourished in no acute distress.  ?Resting comfortably at home.  ?Head is normocephalic, atraumatic.  ?No labored breathing.  ?Speech is clear and coherent with logical content.  ?Patient is alert and oriented at baseline.  ?Throat is red and swollen in the posterior pharynx, uvula is midline ? ?Assessment and Plan: ?1. Strep throat ?- amoxicillin (AMOXIL) 500 MG capsule; Take 1 capsule (500 mg total) by mouth 2 (two) times daily  for 10 days.  Dispense: 20 capsule; Refill: 0 ? ?- Strep throat suspected ?- Amoxicillin prescribed ?- May continue Ibuprofen and/or tylenol for pain, fevers ?- Salt water gargles ?- Warm liquids ?- Seek in person evaluation if not improving or if symptoms worsen ? ?Follow Up Instructions: ?I discussed the assessment and treatment plan with the patient. The patient was provided an opportunity to ask questions and all were answered. The patient agreed with the plan and demonstrated an understanding of the instructions.  A copy of instructions were sent to the patient via MyChart unless otherwise noted below.  ? ? ?The patient was advised to call back or seek an in-person evaluation if the symptoms worsen or if the condition fails to improve as anticipated. ? ?Time:  ?I spent 12 minutes with the patient via telehealth technology discussing the above problems/concerns.   ? ?[name], PA-C ?

## 2021-10-03 ENCOUNTER — Other Ambulatory Visit (HOSPITAL_COMMUNITY): Payer: Self-pay

## 2021-10-21 ENCOUNTER — Encounter (INDEPENDENT_AMBULATORY_CARE_PROVIDER_SITE_OTHER): Payer: 59 | Admitting: Ophthalmology

## 2021-10-21 DIAGNOSIS — H33303 Unspecified retinal break, bilateral: Secondary | ICD-10-CM | POA: Diagnosis not present

## 2021-10-21 DIAGNOSIS — H4421 Degenerative myopia, right eye: Secondary | ICD-10-CM | POA: Diagnosis not present

## 2021-10-21 DIAGNOSIS — I10 Essential (primary) hypertension: Secondary | ICD-10-CM

## 2021-10-21 DIAGNOSIS — H35033 Hypertensive retinopathy, bilateral: Secondary | ICD-10-CM

## 2021-10-21 DIAGNOSIS — H43813 Vitreous degeneration, bilateral: Secondary | ICD-10-CM

## 2021-10-21 DIAGNOSIS — H442A2 Degenerative myopia with choroidal neovascularization, left eye: Secondary | ICD-10-CM | POA: Diagnosis not present

## 2021-10-23 ENCOUNTER — Other Ambulatory Visit (HOSPITAL_COMMUNITY): Payer: Self-pay

## 2021-10-24 ENCOUNTER — Other Ambulatory Visit (HOSPITAL_COMMUNITY): Payer: Self-pay

## 2021-10-24 MED ORDER — CITALOPRAM HYDROBROMIDE 40 MG PO TABS
40.0000 mg | ORAL_TABLET | Freq: Every day | ORAL | 1 refills | Status: DC
  Filled 2021-10-24: qty 90, 90d supply, fill #0
  Filled 2022-01-21: qty 90, 90d supply, fill #1

## 2021-10-30 ENCOUNTER — Other Ambulatory Visit: Payer: Self-pay | Admitting: Physician Assistant

## 2021-10-31 ENCOUNTER — Other Ambulatory Visit (HOSPITAL_COMMUNITY): Payer: Self-pay

## 2021-11-01 ENCOUNTER — Other Ambulatory Visit (HOSPITAL_COMMUNITY): Payer: Self-pay

## 2021-11-01 MED ORDER — LOSARTAN POTASSIUM 100 MG PO TABS
100.0000 mg | ORAL_TABLET | Freq: Every day | ORAL | 2 refills | Status: DC
  Filled 2021-11-01: qty 90, 90d supply, fill #0
  Filled 2022-01-21: qty 90, 90d supply, fill #1
  Filled 2022-05-21: qty 90, 90d supply, fill #2

## 2021-11-06 ENCOUNTER — Other Ambulatory Visit (HOSPITAL_COMMUNITY): Payer: Self-pay

## 2021-11-08 ENCOUNTER — Other Ambulatory Visit (HOSPITAL_COMMUNITY): Payer: Self-pay

## 2021-11-08 MED ORDER — PANTOPRAZOLE SODIUM 40 MG PO TBEC
40.0000 mg | DELAYED_RELEASE_TABLET | Freq: Every day | ORAL | 0 refills | Status: DC
  Filled 2021-11-08: qty 90, 90d supply, fill #0

## 2021-11-08 MED ORDER — ROSUVASTATIN CALCIUM 20 MG PO TABS
20.0000 mg | ORAL_TABLET | Freq: Every day | ORAL | 0 refills | Status: DC
  Filled 2021-11-08: qty 90, 90d supply, fill #0

## 2021-11-09 ENCOUNTER — Other Ambulatory Visit (HOSPITAL_COMMUNITY): Payer: Self-pay

## 2021-11-17 DIAGNOSIS — G4733 Obstructive sleep apnea (adult) (pediatric): Secondary | ICD-10-CM | POA: Diagnosis not present

## 2021-12-09 DIAGNOSIS — G43009 Migraine without aura, not intractable, without status migrainosus: Secondary | ICD-10-CM | POA: Diagnosis not present

## 2021-12-09 DIAGNOSIS — F325 Major depressive disorder, single episode, in full remission: Secondary | ICD-10-CM | POA: Diagnosis not present

## 2021-12-09 DIAGNOSIS — E669 Obesity, unspecified: Secondary | ICD-10-CM | POA: Diagnosis not present

## 2021-12-09 DIAGNOSIS — I10 Essential (primary) hypertension: Secondary | ICD-10-CM | POA: Diagnosis not present

## 2021-12-09 DIAGNOSIS — Z125 Encounter for screening for malignant neoplasm of prostate: Secondary | ICD-10-CM | POA: Diagnosis not present

## 2021-12-09 DIAGNOSIS — Z0000 Encounter for general adult medical examination without abnormal findings: Secondary | ICD-10-CM | POA: Diagnosis not present

## 2021-12-09 DIAGNOSIS — Z23 Encounter for immunization: Secondary | ICD-10-CM | POA: Diagnosis not present

## 2021-12-09 DIAGNOSIS — K219 Gastro-esophageal reflux disease without esophagitis: Secondary | ICD-10-CM | POA: Diagnosis not present

## 2021-12-09 DIAGNOSIS — E785 Hyperlipidemia, unspecified: Secondary | ICD-10-CM | POA: Diagnosis not present

## 2021-12-09 DIAGNOSIS — F1021 Alcohol dependence, in remission: Secondary | ICD-10-CM | POA: Diagnosis not present

## 2021-12-28 DIAGNOSIS — H52203 Unspecified astigmatism, bilateral: Secondary | ICD-10-CM | POA: Diagnosis not present

## 2021-12-28 DIAGNOSIS — H0102A Squamous blepharitis right eye, upper and lower eyelids: Secondary | ICD-10-CM | POA: Diagnosis not present

## 2021-12-28 DIAGNOSIS — H5213 Myopia, bilateral: Secondary | ICD-10-CM | POA: Diagnosis not present

## 2021-12-28 DIAGNOSIS — H0102B Squamous blepharitis left eye, upper and lower eyelids: Secondary | ICD-10-CM | POA: Diagnosis not present

## 2021-12-28 DIAGNOSIS — H524 Presbyopia: Secondary | ICD-10-CM | POA: Diagnosis not present

## 2021-12-29 ENCOUNTER — Other Ambulatory Visit (HOSPITAL_COMMUNITY): Payer: Self-pay

## 2021-12-29 DIAGNOSIS — L218 Other seborrheic dermatitis: Secondary | ICD-10-CM | POA: Diagnosis not present

## 2021-12-29 MED ORDER — HYDROCORTISONE 2.5 % EX CREA
TOPICAL_CREAM | CUTANEOUS | 2 refills | Status: AC
  Filled 2021-12-29: qty 30, 30d supply, fill #0
  Filled 2022-06-24: qty 30, 30d supply, fill #1
  Filled 2022-09-25: qty 30, 30d supply, fill #2

## 2021-12-31 ENCOUNTER — Other Ambulatory Visit (HOSPITAL_COMMUNITY): Payer: Self-pay

## 2022-01-02 ENCOUNTER — Other Ambulatory Visit (HOSPITAL_COMMUNITY): Payer: Self-pay

## 2022-01-02 MED ORDER — BUPROPION HCL ER (XL) 150 MG PO TB24
150.0000 mg | ORAL_TABLET | Freq: Every morning | ORAL | 2 refills | Status: DC
  Filled 2022-01-02: qty 90, 90d supply, fill #0
  Filled 2022-03-26: qty 90, 90d supply, fill #1
  Filled 2022-06-24: qty 90, 90d supply, fill #2

## 2022-01-21 ENCOUNTER — Other Ambulatory Visit (HOSPITAL_COMMUNITY): Payer: Self-pay

## 2022-02-03 ENCOUNTER — Encounter (INDEPENDENT_AMBULATORY_CARE_PROVIDER_SITE_OTHER): Payer: 59 | Admitting: Ophthalmology

## 2022-02-03 DIAGNOSIS — I10 Essential (primary) hypertension: Secondary | ICD-10-CM | POA: Diagnosis not present

## 2022-02-03 DIAGNOSIS — H4421 Degenerative myopia, right eye: Secondary | ICD-10-CM | POA: Diagnosis not present

## 2022-02-03 DIAGNOSIS — H33303 Unspecified retinal break, bilateral: Secondary | ICD-10-CM | POA: Diagnosis not present

## 2022-02-03 DIAGNOSIS — H43813 Vitreous degeneration, bilateral: Secondary | ICD-10-CM

## 2022-02-03 DIAGNOSIS — H442A2 Degenerative myopia with choroidal neovascularization, left eye: Secondary | ICD-10-CM | POA: Diagnosis not present

## 2022-02-03 DIAGNOSIS — H35033 Hypertensive retinopathy, bilateral: Secondary | ICD-10-CM

## 2022-02-04 ENCOUNTER — Other Ambulatory Visit (HOSPITAL_COMMUNITY): Payer: Self-pay

## 2022-02-06 ENCOUNTER — Other Ambulatory Visit (HOSPITAL_COMMUNITY): Payer: Self-pay

## 2022-02-06 MED ORDER — ROSUVASTATIN CALCIUM 20 MG PO TABS
20.0000 mg | ORAL_TABLET | Freq: Every day | ORAL | 0 refills | Status: DC
  Filled 2022-02-06: qty 90, 90d supply, fill #0

## 2022-02-06 MED ORDER — PANTOPRAZOLE SODIUM 40 MG PO TBEC
40.0000 mg | DELAYED_RELEASE_TABLET | Freq: Every day | ORAL | 0 refills | Status: DC
  Filled 2022-02-06: qty 90, 90d supply, fill #0

## 2022-02-09 DIAGNOSIS — G4733 Obstructive sleep apnea (adult) (pediatric): Secondary | ICD-10-CM | POA: Diagnosis not present

## 2022-03-27 ENCOUNTER — Other Ambulatory Visit (HOSPITAL_COMMUNITY): Payer: Self-pay

## 2022-04-16 ENCOUNTER — Other Ambulatory Visit (HOSPITAL_COMMUNITY): Payer: Self-pay

## 2022-04-16 ENCOUNTER — Other Ambulatory Visit: Payer: Self-pay | Admitting: Interventional Cardiology

## 2022-04-17 ENCOUNTER — Other Ambulatory Visit (HOSPITAL_COMMUNITY): Payer: Self-pay

## 2022-04-17 MED ORDER — CITALOPRAM HYDROBROMIDE 40 MG PO TABS
40.0000 mg | ORAL_TABLET | Freq: Every day | ORAL | 2 refills | Status: DC
  Filled 2022-04-17: qty 90, 90d supply, fill #0
  Filled 2022-07-15: qty 90, 90d supply, fill #1
  Filled 2022-10-14: qty 90, 90d supply, fill #2

## 2022-04-18 ENCOUNTER — Other Ambulatory Visit (HOSPITAL_COMMUNITY): Payer: Self-pay

## 2022-04-18 MED ORDER — AMLODIPINE BESYLATE 5 MG PO TABS
5.0000 mg | ORAL_TABLET | Freq: Every day | ORAL | 0 refills | Status: DC
  Filled 2022-04-18: qty 30, 30d supply, fill #0

## 2022-05-08 ENCOUNTER — Other Ambulatory Visit: Payer: Self-pay

## 2022-05-08 ENCOUNTER — Ambulatory Visit (INDEPENDENT_AMBULATORY_CARE_PROVIDER_SITE_OTHER): Payer: 59

## 2022-05-08 DIAGNOSIS — Z23 Encounter for immunization: Secondary | ICD-10-CM | POA: Diagnosis not present

## 2022-05-21 ENCOUNTER — Other Ambulatory Visit (HOSPITAL_COMMUNITY): Payer: Self-pay

## 2022-05-21 ENCOUNTER — Other Ambulatory Visit: Payer: Self-pay | Admitting: Interventional Cardiology

## 2022-05-22 ENCOUNTER — Other Ambulatory Visit (HOSPITAL_COMMUNITY): Payer: Self-pay

## 2022-05-22 MED ORDER — PANTOPRAZOLE SODIUM 40 MG PO TBEC
40.0000 mg | DELAYED_RELEASE_TABLET | Freq: Every day | ORAL | 2 refills | Status: DC
  Filled 2022-05-22: qty 90, 90d supply, fill #0
  Filled 2022-08-12: qty 90, 90d supply, fill #1

## 2022-05-22 MED ORDER — ROSUVASTATIN CALCIUM 20 MG PO TABS
20.0000 mg | ORAL_TABLET | Freq: Every day | ORAL | 2 refills | Status: DC
  Filled 2022-05-22: qty 90, 90d supply, fill #0
  Filled 2022-08-12: qty 90, 90d supply, fill #1

## 2022-05-22 MED ORDER — AMLODIPINE BESYLATE 5 MG PO TABS
5.0000 mg | ORAL_TABLET | Freq: Every day | ORAL | 3 refills | Status: DC
  Filled 2022-05-22: qty 90, 90d supply, fill #0
  Filled 2022-08-12: qty 90, 90d supply, fill #1
  Filled 2022-08-14: qty 30, 30d supply, fill #1

## 2022-05-26 ENCOUNTER — Encounter (INDEPENDENT_AMBULATORY_CARE_PROVIDER_SITE_OTHER): Payer: 59 | Admitting: Ophthalmology

## 2022-05-28 NOTE — Progress Notes (Signed)
Cardiology Office Note:    Date:  05/29/2022   ID:  [name], DOB 09-Dec-1970, MRN [id number]  PCP:  Sigmund Hazel, MD  Cardiologist:  Lesleigh Noe, MD   Referring MD: Sigmund Hazel, MD   Chief Complaint  Patient presents with   Chest Pain   Hypertension   Hyperlipidemia    History of Present Illness:    [name] is a 51 y.o. 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    [name] is having intermittent episodes of chest discomfort over the last 10-12 years but I have known him.  No diagnosis of obstructive coronary disease.  He has had remote coronary angiography and has a left anterior descending myocardial bridge which is a potential cause of some of the pain that he has had over time.  He has never undergone evaluation for microvascular dysfunction.  We have continuously controlled risk factors over the last 10 years including lipid management, blood pressure management, and exercise.  He is in the day because he missed a previous appointment and needs to have his antihypertensive therapy resumed/refilled.  Past Medical History:  Diagnosis Date   Asthma    Coronary artery disease CARDIOLOGIST - DR Verdis Prime--- LAST VISIT OCT 2012   DENIES S & S   Depression    Dysuria    GERD (gastroesophageal reflux disease)    H/O hiatal hernia    History of angina pectoris    Hypertension    Kidney calculi    Left ureteral calculus    Myocardial bridge MID LAD   Shortness of breath    with excertion    Past Surgical History:  Procedure Laterality Date   CARDIAC CATHETERIZATION  01-07-2010-- DR Verdis Prime   NORMAL LVF/ PATENT CORONARY ARTERIES/ MYOCARDIAL BRIDGE MID LAD   CYSTOSCOPY W/ URETERAL STENT PLACEMENT  08/15/2011   Procedure: CYSTOSCOPY WITH STENT REPLACEMENT;  Surgeon: Antony Haste, MD;   Location: [facility];  Service: Urology;  Laterality: Left;  stone basket extraction left, left ureteroscopy   EXTRACORPOREAL SHOCK WAVE LITHOTRIPSY  08-07-11   LEFT   VERICOCELECTOMY  1992    Current Medications: Current Meds  Medication Sig   amLODipine (NORVASC) 5 MG tablet Take 1 tablet (5 mg total) by mouth daily. Please call our office to schedule an overdue appointment with Dr. Katrinka Blazing before anymore refills. [phone].   BESIVANCE 0.6 % SUSP Place 1 drop into the left eye as directed.   buPROPion (WELLBUTRIN XL) 150 MG 24 hr tablet Take 150 mg by mouth daily.   buPROPion (WELLBUTRIN XL) 150 MG 24 hr tablet Take 1 tablet by mouth every morning.   citalopram (CELEXA) 40 MG tablet Take 1 tablet (40 mg total) by mouth daily.   clobetasol cream (TEMOVATE) 0.05 % Apply 1 application topically as needed.   glucosamine-chondroitin 500-400 MG tablet Take 1 tablet by mouth 3 (three) times daily.   hydrocortisone 2.5 % cream Apply small amount to affected area of eyebrows twice a day.   ibuprofen (ADVIL) 800 MG tablet Take 800 mg by mouth as needed.   Multiple Vitamin (MULTIVITAMIN) tablet Take 1 tablet by mouth daily.   nitroGLYCERIN (NITROLINGUAL) 0.4 MG/SPRAY spray Place 1 spray under the tongue every 5 (five) minutes x 3 doses as needed for chest pain. Pt  must keep upcoming appt before anymore refills. Final Attempt   pantoprazole (PROTONIX) 40 MG tablet Take 40 mg by mouth daily.   pantoprazole (PROTONIX) 40 MG tablet Take 1 tablet (40 mg total) by mouth daily.   rosuvastatin (CRESTOR) 20 MG tablet Take 20 mg by mouth daily.   rosuvastatin (CRESTOR) 20 MG tablet Take 1 tablet (20 mg total) by mouth daily.   [DISCONTINUED] losartan (COZAAR) 100 MG tablet Take 1 tablet (100 mg total) by mouth daily.     Allergies:   Ansaid [ocufen], Chantix [varenicline tartrate], Codeine, and Erythromycin   Social History   Socioeconomic History   Marital status: Married    Spouse  name: Not on file   Number of children: 0   Years of education: Not on file   Highest education level: Not on file  Occupational History   Not on file  Tobacco Use   Smoking status: Former    Packs/day: 3.00    Years: 10.00    Total pack years: 30.00    Types: Cigarettes, Cigars    Quit date: 08/13/2005    Years since quitting: 16.8   Smokeless tobacco: Former  Building services engineer Use: Never used  Substance and Sexual Activity   Alcohol use: No    Alcohol/week: 0.0 standard drinks of alcohol   Drug use: No   Sexual activity: Not on file  Other Topics Concern   Not on file  Social History Narrative   Not on file   Social Determinants of Health   Financial Resource Strain: Not on file  Food Insecurity: Not on file  Transportation Needs: Not on file  Physical Activity: Not on file  Stress: Not on file  Social Connections: Not on file     Family History: The patient's family history includes Alzheimer's disease in his maternal grandmother; Aortic aneurysm in his paternal grandfather; Cancer - Lung in his father; Emphysema in his paternal grandfather; Heart attack in his brother; Hypertension in his mother and paternal grandmother; Stroke in his maternal grandfather.  ROS:   Please see the history of present illness.    He has gained some weight but does not feel limited.  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  All other systems reviewed and are negative.  EKGs/Labs/Other Studies Reviewed:    The following studies were reviewed today: No recent imaging since the 2021 coronary CTA.    EKG:  EKG sinus rhythm at 72 bpm, normal appearing EKG.  When compared to November 2022, no significant changes noted.  Recent Labs: No results found for requested labs within last 365 days.  Recent Lipid Panel    Component Value Date/Time   CHOL 144 07/30/2019 0956   TRIG 80 07/30/2019 0956   HDL 51 07/30/2019 0956   CHOLHDL 2.8 07/30/2019 0956   CHOLHDL 5.3 (H) 01/12/2017 0819    VLDL 34 (H) 01/12/2017 0819   LDLCALC 78 07/30/2019 0956    Physical Exam:    VS:  BP (!) 118/90   Pulse 72   Ht 5\' 9"  (1.753 m)   Wt 221 lb (100.2 kg)   SpO2 95%   BMI 32.64 kg/m     Wt Readings from Last 3 Encounters:  05/29/22 221 lb (100.2 kg)  04/26/21 219 lb 6.4 oz (99.5 kg)  09/01/20 212 lb (96.2 kg)     GEN: Overweight. No acute distress HEENT: Normal NECK: No JVD. LYMPHATICS: No lymphadenopathy CARDIAC: No murmur. RRR no gallop, or  edema. VASCULAR:  Normal Pulses. No bruits. RESPIRATORY:  Clear to auscultation without rales, wheezing or rhonchi  ABDOMEN: Soft, non-tender, non-distended, No pulsatile mass, MUSCULOSKELETAL: No deformity  SKIN: Warm and dry NEUROLOGIC:  Alert and oriented x 3 PSYCHIATRIC:  Normal affect   ASSESSMENT:    1. Chest pain of uncertain etiology   2. Essential hypertension   3. Mixed hyperlipidemia   4. Myocardial bridge    PLAN:    In order of problems listed above:  Etiology could be multifactorial.  There have been instances where the discomfort had an inflammatory quality.  Microvascular disease is conceivable and has not been excluded. Prior documentation LAD myocardial bridge.  Currently asymptomatic and therefore no changes needed. Continue losartan and amlodipine. Continue Crestor. Noted on angiography in 2014.   Overall education and awareness concerning primary/secondary risk prevention was discussed in detail: LDL less than 70, hemoglobin A1c less than 7, blood pressure target less than 130/80 mmHg, >150 minutes of moderate aerobic activity per week, avoidance of smoking, weight control (via diet and exercise), and continued surveillance/management of/for obstructive sleep apnea.    Medication Adjustments/Labs and Tests Ordered: Current medicines are reviewed at length with the patient today.  Concerns regarding medicines are outlined above.  Orders Placed This Encounter  Procedures   EKG 12-Lead   Meds ordered  this encounter  Medications   nitroGLYCERIN (NITROSTAT) 0.4 MG SL tablet    Sig: Place 1 tablet (0.4 mg total) under the tongue every 5 (five) minutes as needed for chest pain.    Dispense:  25 tablet    Refill:  3    Use this if no nitro spray   losartan (COZAAR) 100 MG tablet    Sig: Take 1 tablet (100 mg total) by mouth daily.    Dispense:  90 tablet    Refill:  3    Dose change    Patient Instructions  Medication Instructions:  Your physician recommends that you continue on your current medications as directed. Please refer to the Current Medication list given to you today.  *If you need a refill on your cardiac medications before your next appointment, please call your pharmacy*  Follow-Up: At [facility], you and your health needs are our priority.  As part of our continuing mission to provide you with exceptional heart care, we have created designated Provider Care Teams.  These Care Teams include your primary Cardiologist (physician) and Advanced Practice Providers (APPs -  Physician Assistants and Nurse Practitioners) who all work together to provide you with the care you need, when you need it.  Your next appointment:   1 year(s)  The format for your next appointment:   In Person  Provider:   Donato Schultz, MD or Riley Lam, MD or Laurance Flatten, MD  Important Information About Sugar         Signed, Lesleigh Noe, MD  05/29/2022 2:31 PM    [facility]

## 2022-05-29 ENCOUNTER — Encounter: Payer: Self-pay | Admitting: Interventional Cardiology

## 2022-05-29 ENCOUNTER — Other Ambulatory Visit (HOSPITAL_COMMUNITY): Payer: Self-pay

## 2022-05-29 ENCOUNTER — Ambulatory Visit: Payer: 59 | Attending: Interventional Cardiology | Admitting: Interventional Cardiology

## 2022-05-29 VITALS — BP 118/90 | HR 72 | Ht 69.0 in | Wt 221.0 lb

## 2022-05-29 DIAGNOSIS — E782 Mixed hyperlipidemia: Secondary | ICD-10-CM

## 2022-05-29 DIAGNOSIS — R079 Chest pain, unspecified: Secondary | ICD-10-CM | POA: Diagnosis not present

## 2022-05-29 DIAGNOSIS — I10 Essential (primary) hypertension: Secondary | ICD-10-CM | POA: Diagnosis not present

## 2022-05-29 DIAGNOSIS — Q245 Malformation of coronary vessels: Secondary | ICD-10-CM | POA: Diagnosis not present

## 2022-05-29 MED ORDER — LOSARTAN POTASSIUM 100 MG PO TABS
100.0000 mg | ORAL_TABLET | Freq: Every day | ORAL | 3 refills | Status: DC
  Filled 2022-05-29 – 2022-08-12 (×2): qty 90, 90d supply, fill #0
  Filled 2022-11-11: qty 90, 90d supply, fill #1
  Filled 2023-02-10: qty 90, 90d supply, fill #2
  Filled 2023-05-12: qty 90, 90d supply, fill #3

## 2022-05-29 MED ORDER — NITROGLYCERIN 0.4 MG SL SUBL
0.4000 mg | SUBLINGUAL_TABLET | SUBLINGUAL | 3 refills | Status: AC | PRN
  Filled 2022-05-29: qty 25, 8d supply, fill #0

## 2022-05-29 NOTE — Patient Instructions (Signed)
Medication Instructions:  Your physician recommends that you continue on your current medications as directed. Please refer to the Current Medication list given to you today.  *If you need a refill on your cardiac medications before your next appointment, please call your pharmacy*  Follow-Up: At [facility], you and your health needs are our priority.  As part of our continuing mission to provide you with exceptional heart care, we have created designated Provider Care Teams.  These Care Teams include your primary Cardiologist (physician) and Advanced Practice Providers (APPs -  Physician Assistants and Nurse Practitioners) who all work together to provide you with the care you need, when you need it.  Your next appointment:   1 year(s)  The format for your next appointment:   In Person  Provider:   Mark Skains, MD or Mahesh Chandrasekhar, MD or Heather Pemberton, MD  Important Information About Sugar       

## 2022-06-02 ENCOUNTER — Encounter (INDEPENDENT_AMBULATORY_CARE_PROVIDER_SITE_OTHER): Payer: 59 | Admitting: Ophthalmology

## 2022-06-02 DIAGNOSIS — I10 Essential (primary) hypertension: Secondary | ICD-10-CM

## 2022-06-02 DIAGNOSIS — H33303 Unspecified retinal break, bilateral: Secondary | ICD-10-CM

## 2022-06-02 DIAGNOSIS — H43813 Vitreous degeneration, bilateral: Secondary | ICD-10-CM | POA: Diagnosis not present

## 2022-06-02 DIAGNOSIS — H35033 Hypertensive retinopathy, bilateral: Secondary | ICD-10-CM | POA: Diagnosis not present

## 2022-06-02 DIAGNOSIS — H442A2 Degenerative myopia with choroidal neovascularization, left eye: Secondary | ICD-10-CM

## 2022-06-02 DIAGNOSIS — H4421 Degenerative myopia, right eye: Secondary | ICD-10-CM

## 2022-06-26 ENCOUNTER — Other Ambulatory Visit: Payer: Self-pay

## 2022-07-17 ENCOUNTER — Other Ambulatory Visit: Payer: Self-pay

## 2022-07-17 ENCOUNTER — Other Ambulatory Visit (HOSPITAL_COMMUNITY): Payer: Self-pay

## 2022-07-17 DIAGNOSIS — Z0000 Encounter for general adult medical examination without abnormal findings: Secondary | ICD-10-CM | POA: Diagnosis not present

## 2022-07-17 DIAGNOSIS — E669 Obesity, unspecified: Secondary | ICD-10-CM | POA: Diagnosis not present

## 2022-07-17 DIAGNOSIS — Z6832 Body mass index (BMI) 32.0-32.9, adult: Secondary | ICD-10-CM | POA: Diagnosis not present

## 2022-07-17 DIAGNOSIS — F329 Major depressive disorder, single episode, unspecified: Secondary | ICD-10-CM | POA: Diagnosis not present

## 2022-07-17 DIAGNOSIS — J4520 Mild intermittent asthma, uncomplicated: Secondary | ICD-10-CM | POA: Diagnosis not present

## 2022-07-17 DIAGNOSIS — I10 Essential (primary) hypertension: Secondary | ICD-10-CM | POA: Diagnosis not present

## 2022-07-17 DIAGNOSIS — G43009 Migraine without aura, not intractable, without status migrainosus: Secondary | ICD-10-CM | POA: Diagnosis not present

## 2022-07-17 MED ORDER — UBRELVY 50 MG PO TABS
50.0000 mg | ORAL_TABLET | ORAL | 1 refills | Status: DC
  Filled 2022-07-17: qty 30, 30d supply, fill #0

## 2022-07-17 MED ORDER — BUPROPION HCL ER (XL) 300 MG PO TB24
300.0000 mg | ORAL_TABLET | Freq: Every morning | ORAL | 1 refills | Status: DC
  Filled 2022-07-17: qty 30, 30d supply, fill #0
  Filled 2022-08-12: qty 30, 30d supply, fill #1

## 2022-07-17 MED ORDER — ALBUTEROL SULFATE HFA 108 (90 BASE) MCG/ACT IN AERS
2.0000 | INHALATION_SPRAY | Freq: Every day | RESPIRATORY_TRACT | 1 refills | Status: AC | PRN
  Filled 2022-07-17: qty 6.7, 90d supply, fill #0
  Filled 2023-07-06: qty 6.7, 90d supply, fill #1

## 2022-07-18 ENCOUNTER — Other Ambulatory Visit (HOSPITAL_COMMUNITY): Payer: Self-pay

## 2022-07-18 ENCOUNTER — Other Ambulatory Visit: Payer: Self-pay

## 2022-07-18 MED ORDER — SUMATRIPTAN SUCCINATE 25 MG PO TABS
25.0000 mg | ORAL_TABLET | Freq: Every day | ORAL | 1 refills | Status: DC | PRN
  Filled 2022-07-18 (×2): qty 18, 30d supply, fill #0
  Filled 2023-07-06: qty 18, 30d supply, fill #1

## 2022-07-31 DIAGNOSIS — F339 Major depressive disorder, recurrent, unspecified: Secondary | ICD-10-CM | POA: Diagnosis not present

## 2022-07-31 DIAGNOSIS — Z6832 Body mass index (BMI) 32.0-32.9, adult: Secondary | ICD-10-CM | POA: Diagnosis not present

## 2022-07-31 DIAGNOSIS — E291 Testicular hypofunction: Secondary | ICD-10-CM | POA: Diagnosis not present

## 2022-08-04 DIAGNOSIS — R972 Elevated prostate specific antigen [PSA]: Secondary | ICD-10-CM | POA: Diagnosis not present

## 2022-08-07 ENCOUNTER — Other Ambulatory Visit: Payer: Self-pay | Admitting: Cardiovascular Disease

## 2022-08-07 ENCOUNTER — Encounter: Payer: Self-pay | Admitting: Cardiovascular Disease

## 2022-08-07 MED ORDER — ISOSORBIDE MONONITRATE ER 30 MG PO TB24
30.0000 mg | ORAL_TABLET | Freq: Every day | ORAL | 3 refills | Status: DC
  Filled 2022-08-07: qty 90, 90d supply, fill #0
  Filled 2022-11-04: qty 90, 90d supply, fill #1
  Filled 2023-01-27: qty 90, 90d supply, fill #2
  Filled 2023-05-01: qty 90, 90d supply, fill #3

## 2022-08-07 NOTE — Progress Notes (Unsigned)
Patient of Dr. Daneen Schick, who recently retired. Has a history of age-advanced coronary atherosclerosis (coronary calcium score 88th percentile), but without any significant coronary stenoses as of invasive coronary angiogram in 2014 and coronary CT angiogram in 2021, as well as a history of coronary "syndrome X" and an LAD myocardial bridge.  In the past has done well with isosorbide mononitrate, but this was discontinued when he had been asymptomatic for a long period of time.  Has noticed increased exertional chest discomfort and fatigue over the last 2-3 months, symptoms which have responded well to sublingual nitroglycerin.  Migraines have been well-controlled and have not bothered him in a long time.  He is on a highly effective statin as well as amlodipine. Will restart isosorbide mononitrate 30 mg daily and schedule him for a follow-up appointment.

## 2022-08-08 ENCOUNTER — Other Ambulatory Visit: Payer: Self-pay

## 2022-08-12 ENCOUNTER — Other Ambulatory Visit (HOSPITAL_COMMUNITY): Payer: Self-pay

## 2022-08-14 ENCOUNTER — Other Ambulatory Visit: Payer: Self-pay

## 2022-08-15 ENCOUNTER — Other Ambulatory Visit: Payer: Self-pay

## 2022-08-18 ENCOUNTER — Telehealth (HOSPITAL_COMMUNITY): Payer: Self-pay | Admitting: *Deleted

## 2022-08-18 ENCOUNTER — Other Ambulatory Visit (HOSPITAL_COMMUNITY): Payer: Self-pay

## 2022-08-18 ENCOUNTER — Ambulatory Visit: Payer: 59 | Attending: Cardiovascular Disease | Admitting: Cardiovascular Disease

## 2022-08-18 ENCOUNTER — Encounter: Payer: Self-pay | Admitting: Cardiovascular Disease

## 2022-08-18 VITALS — BP 138/92 | HR 106 | Ht 69.0 in | Wt 224.2 lb

## 2022-08-18 DIAGNOSIS — E785 Hyperlipidemia, unspecified: Secondary | ICD-10-CM

## 2022-08-18 DIAGNOSIS — I10 Essential (primary) hypertension: Secondary | ICD-10-CM | POA: Diagnosis not present

## 2022-08-18 DIAGNOSIS — G4733 Obstructive sleep apnea (adult) (pediatric): Secondary | ICD-10-CM | POA: Diagnosis not present

## 2022-08-18 DIAGNOSIS — R079 Chest pain, unspecified: Secondary | ICD-10-CM

## 2022-08-18 DIAGNOSIS — E782 Mixed hyperlipidemia: Secondary | ICD-10-CM

## 2022-08-18 DIAGNOSIS — E669 Obesity, unspecified: Secondary | ICD-10-CM | POA: Diagnosis not present

## 2022-08-18 DIAGNOSIS — I25118 Atherosclerotic heart disease of native coronary artery with other forms of angina pectoris: Secondary | ICD-10-CM | POA: Diagnosis not present

## 2022-08-18 MED ORDER — EZETIMIBE 10 MG PO TABS
10.0000 mg | ORAL_TABLET | Freq: Every day | ORAL | 3 refills | Status: DC
  Filled 2022-08-18: qty 90, 90d supply, fill #0
  Filled 2022-11-04: qty 90, 90d supply, fill #1
  Filled 2023-01-27: qty 90, 90d supply, fill #2
  Filled 2023-05-12: qty 90, 90d supply, fill #3

## 2022-08-18 MED ORDER — CARVEDILOL 6.25 MG PO TABS
6.2500 mg | ORAL_TABLET | Freq: Two times a day (BID) | ORAL | 3 refills | Status: DC
  Filled 2022-08-18: qty 180, 90d supply, fill #0
  Filled 2022-11-04: qty 180, 90d supply, fill #1
  Filled 2023-01-27: qty 180, 90d supply, fill #2
  Filled 2023-05-12: qty 180, 90d supply, fill #3

## 2022-08-18 MED ORDER — METOPROLOL TARTRATE 100 MG PO TABS
100.0000 mg | ORAL_TABLET | Freq: Once | ORAL | 0 refills | Status: DC
  Filled 2022-08-18: qty 1, 1d supply, fill #0

## 2022-08-18 NOTE — Telephone Encounter (Signed)
Patient calling about his upcoming cardiac imaging study; pt verbalizes understanding of appt date/time, parking situation and where to check in, pre-test NPO status and medications ordered, and verified current allergies; name and call back number provided for further questions should they arise  Gordy Clement RN Navigator Cardiac [place] [facility] [phone] office [phone] cell  Patient recently start on carvedilol. He will take '100mg'$  metoprolol tartrate two hours prior to his cardiac CT scan. He is aware to arrive at 3pm.

## 2022-08-18 NOTE — Progress Notes (Signed)
Cardiology Office Note:    Date:  08/19/2022   ID:  [name], DOB 1970/06/17, MRN [id number]  PCP:  Kathyrn Lass, MD   [facility] Cardiologist:  Sinclair Grooms, MD (Inactive)     Referring MD: Kathyrn Lass, MD   Chief Complaint  Patient presents with   Chest Pain    History of Present Illness:    [name] is a 52 y.o. male with a hx of early onset coronary atherosclerosis,, "coronary syndrome X" related chest pain, myocardial bridge, hypercholesterolemia, hypertension, OSA previously cared for by Dr. Daneen Schick, transitioning his care today after Dr. Thompson Caul retirement.  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  [name] is had problems with chest pain as far back as 2011 when he was only 52 years old.  His coronary calcium score is high placing him in the 88th percentile for age, but previous invasive angiography in 2011 and coronary CT angiogram in 2021 did not show evidence of severe coronary stenoses.  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  In the past his symptoms were well-controlled with isosorbide mononitrate and metoprolol, but after not having chest pain for many years this was discontinued.    Just in last roughly 3 months, [name] has had a few recurrent episodes of chest pain.  They are usually brief and occur at rest, lasting a maximum of 15-20 minutes.  Most episodes resolve spontaneously, but he did take nitroglycerin once about 2 weeks ago.  After our recent phone conversation we started isosorbide mononitrate 30 mg daily and the symptoms have improved.  He reports that he has gained weight recently and has noticed that his resting heart rate is faster.  He becomes easily winded when he plays with his 82-year-old nieces.  He had normal PFTs in March 2022.  He is compliant with CPAP and denies daytime hypersomnolence.  Denies orthopnea, PND, lower extremity edema, dizziness, palpitations, syncope, claudication,  erectile dysfunction.  He has a strong family history of early onset CAD.  His brother had a myocardial infarction at age 77 and has received 5 stents.  His paternal grandfather died of a ruptured AAA.  His father died of lung cancer at age 15 (probably due to a combination of smoking and environmental exposure).  His blood pressure has been running relatively high recently, usually in the high 130s-140s/low 90s.  His most recent lipid profile from almost a year ago showed an LDL cholesterol of 85, HDL 42, normal triglycerides.  He does not have diabetes mellitus.  He has a roughly 30-pack-year history of smoking but quit in 2007.  ECG today is normal, without any repolarization abnormalities.  His QTc is borderline at 467 ms.  Past Medical History:  Diagnosis Date   Asthma    Coronary artery disease CARDIOLOGIST - DR Daneen Schick--- LAST VISIT OCT 2012   DENIES S & S   Depression    Dysuria    GERD (gastroesophageal reflux disease)    H/O hiatal hernia    History of angina pectoris    Hypertension    Kidney calculi    Left ureteral calculus    Myocardial bridge MID LAD   Shortness of breath    with excertion    Past Surgical History:  Procedure Laterality Date   CARDIAC CATHETERIZATION  01-07-2010-- DR Daneen Schick   NORMAL LVF/ PATENT CORONARY ARTERIES/ MYOCARDIAL BRIDGE MID LAD   CYSTOSCOPY W/ URETERAL  STENT PLACEMENT  08/15/2011   Procedure: CYSTOSCOPY WITH STENT REPLACEMENT;  Surgeon: Fredricka Bonine, MD;  Location: [facility];  Service: Urology;  Laterality: Left;  stone basket extraction left, left ureteroscopy   EXTRACORPOREAL SHOCK WAVE LITHOTRIPSY  08-07-11   LEFT   VERICOCELECTOMY  1992    Current Medications: Current Meds  Medication Sig   amLODipine (NORVASC) 5 MG tablet Take 1 tablet (5 mg total) by mouth daily. Please call our office to schedule an overdue appointment with Dr. Tamala Julian before anymore refills. [phone].   buPROPion  (WELLBUTRIN XL) 300 MG 24 hr tablet Take 1 tablet (300 mg total) by mouth every morning.   carvedilol (COREG) 6.25 MG tablet Take 1 tablet (6.25 mg total) by mouth 2 (two) times daily.   citalopram (CELEXA) 40 MG tablet Take 1 tablet (40 mg total) by mouth daily.   Coenzyme Q10 (CO Q 10) 100 MG CAPS Take 1 capsule by mouth daily in the afternoon.   ezetimibe (ZETIA) 10 MG tablet Take 1 tablet (10 mg total) by mouth daily.   glucosamine-chondroitin 500-400 MG tablet Take 1 tablet by mouth 3 (three) times daily.   ibuprofen (ADVIL) 800 MG tablet Take 800 mg by mouth as needed.   isosorbide mononitrate (IMDUR) 30 MG 24 hr tablet Take 1 tablet (30 mg total) by mouth daily.   losartan (COZAAR) 100 MG tablet Take 1 tablet (100 mg total) by mouth daily.   metoprolol tartrate (LOPRESSOR) 100 MG tablet Take 1 tablet (100 mg total) by mouth once for 1 dose. PLEASE TAKE METOPROLOL 2  HOURS PRIOR TO CTA SCAN.   Multiple Vitamin (MULTIVITAMIN) tablet Take 1 tablet by mouth daily.   pantoprazole (PROTONIX) 40 MG tablet Take 40 mg by mouth daily.   rosuvastatin (CRESTOR) 20 MG tablet Take 20 mg by mouth daily.   SUMAtriptan (IMITREX) 25 MG tablet Take 1 tablet (25 mg total) by mouth daily as needed.     Allergies:   Ansaid [ocufen], Chantix [varenicline tartrate], Codeine, and Erythromycin   Social History   Socioeconomic History   Marital status: Married    Spouse name: Not on file   Number of children: 0   Years of education: Not on file   Highest education level: Not on file  Occupational History   Not on file  Tobacco Use   Smoking status: Former    Packs/day: 3.00    Years: 10.00    Total pack years: 30.00    Types: Cigarettes, Cigars    Quit date: 08/13/2005    Years since quitting: 17.0   Smokeless tobacco: Former  Scientific laboratory technician Use: Never used  Substance and Sexual Activity   Alcohol use: No    Alcohol/week: 0.0 standard drinks of alcohol   Drug use: No   Sexual activity: Not  on file  Other Topics Concern   Not on file  Social History Narrative   Not on file   Social Determinants of Health   Financial Resource Strain: Not on file  Food Insecurity: Not on file  Transportation Needs: Not on file  Physical Activity: Not on file  Stress: Not on file  Social Connections: Not on file     Family History: The patient's family history includes Alzheimer's disease in his maternal grandmother; Aortic aneurysm in his paternal grandfather; Cancer - Lung in his father; Emphysema in his paternal grandfather; Heart attack in his brother; Hypertension in his mother and paternal grandmother; Stroke in  his maternal grandfather.  ROS:   Please see the history of present illness.     All other systems reviewed and are negative.  EKGs/Labs/Other Studies Reviewed:    The following studies were reviewed today: Coronary CT angiogram 07/31/2019  1. Coronary calcium score of 65. This was 30 percentile for age and sex matched control. 2. Normal coronary origin with right dominance. 3. Minimal (0-24%) calcified plaque in the distal LM; CADRADS-1.  Reviewed coronary angiography from 2011.  EKG:  EKG is  ordered today.  The ekg ordered today demonstrates normal sinus rhythm, normal tracing send borderline QTc 467 ms  Recent Labs: 08/18/2022: BUN 20; Creatinine, Ser 1.07; Potassium 4.3; Sodium 139  Recent Lipid Panel    Component Value Date/Time   CHOL 144 07/30/2019 0956   TRIG 80 07/30/2019 0956   HDL 51 07/30/2019 0956   CHOLHDL 2.8 07/30/2019 0956   CHOLHDL 5.3 (H) 01/12/2017 0819   VLDL 34 (H) 01/12/2017 0819   LDLCALC 78 07/30/2019 0956     Risk Assessment/Calculations:      HYPERTENSION CONTROL Vitals:   08/18/22 0848 08/18/22 1045  BP: (!) 124/96 (!) 138/92    The patient's blood pressure is elevated above target today.  In order to address the patient's elevated BP: A new medication was prescribed today.            Physical Exam:    VS:  BP  (!) 138/92   Pulse (!) 106 Comment: ekg pulse 89  Ht '5\' 9"'$  (1.753 m)   Wt 224 lb 3.2 oz (101.7 kg)   SpO2 96%   BMI 33.11 kg/m     Wt Readings from Last 3 Encounters:  08/18/22 224 lb 3.2 oz (101.7 kg)  05/29/22 221 lb (100.2 kg)  04/26/21 219 lb 6.4 oz (99.5 kg)     GEN: Mildly obese, well nourished, well developed in no acute distress HEENT: Normal NECK: No JVD; No carotid bruits LYMPHATICS: No lymphadenopathy CARDIAC: RRR, no murmurs, rubs, gallops RESPIRATORY:  Clear to auscultation without rales, wheezing or rhonchi  ABDOMEN: Soft, non-tender, non-distended MUSCULOSKELETAL:  No edema; No deformity  SKIN: Warm and dry NEUROLOGIC:  Alert and oriented x 3 PSYCHIATRIC:  Normal affect   ASSESSMENT:    1. Coronary artery disease involving native coronary artery of native heart with other form of angina pectoris ([place])   2. Dyslipidemia (high LDL; low HDL)   3. Essential hypertension   4. OSA on CPAP   5. Mild obesity    PLAN:    In order of problems listed above:  CAD: He has aged denies coronary arthrosclerosis, but has not had any evidence of significant stenoses by angiography in 2011 and coronary CT angiography most recently in 2021.  Symptoms are possibly due to coronary vasospasm since they occur at rest and respond to long-acting nitrates.  May have microvascular angina.  However some episodes have been fairly lengthy.  It is reasonable to repeat imaging with CT angiography. HLP: Most recent HDL cholesterol was lower compared to years past and this may be related to weight gain and less physical activity.  Will need to work on weight loss, improved diet, regular aerobic exercise.  It appears that untreated LDL cholesterol is around 170.  On the current dose of statin his LDL is substantially lower at 85, but not at target (less than 70).  And ezetimibe 10 mg once daily.  Discussed healthy diet in some detail.  Recheck lipids in several months.  HTN: No longer  well-controlled, possibly due to weight gain.  Discussed sodium in his diet.  Would like to add a beta-blocker and prefer carvedilol since he possibly could have vasospasm. OSA: Encouraged continued compliance with CPAP. Obesity: Weight loss would have multiple positive effects from his cardiovascular health.           Medication Adjustments/Labs and Tests Ordered: Current medicines are reviewed at length with the patient today.  Concerns regarding medicines are outlined above.  Orders Placed This Encounter  Procedures   CT CORONARY MORPH W/CTA COR W/SCORE W/CA W/CM &/OR WO/CM   Basic metabolic panel   Lipid panel   EKG 12-Lead   Meds ordered this encounter  Medications   carvedilol (COREG) 6.25 MG tablet    Sig: Take 1 tablet (6.25 mg total) by mouth 2 (two) times daily.    Dispense:  180 tablet    Refill:  3   ezetimibe (ZETIA) 10 MG tablet    Sig: Take 1 tablet (10 mg total) by mouth daily.    Dispense:  90 tablet    Refill:  3   metoprolol tartrate (LOPRESSOR) 100 MG tablet    Sig: Take 1 tablet (100 mg total) by mouth once for 1 dose. PLEASE TAKE METOPROLOL 2  HOURS PRIOR TO CTA SCAN.    Dispense:  1 tablet    Refill:  0    Pt plans to pick up this prescription today as well as the other 2 that were placed earlier (he does not want them mailed, thank you)    Patient Instructions  Medication Instructions:  Metoprolol '100mg'$  2 hrs before CT Carvedilol 6.'25mg'$  twice a day Zetia '10mg'$  daily *If you need a refill on your cardiac medications before your next appointment, please call your pharmacy*   Lab Work: BMET- today Lipid Profine- Fasting in 3 months If you have labs (blood work) drawn today and your tests are completely normal, you will receive your results only by: [place] (if you have MyChart) OR A paper copy in the mail If you have any lab test that is abnormal or we need to change your treatment, we will call you to review the  results.   Testing/Procedures: Your physician has requested that you have cardiac CT. Cardiac computed tomography (CT) is a painless test that uses an x-ray machine to take clear, detailed pictures of your heart. For further information please visit HugeFiesta.[email or web address]. Please follow instruction sheet as given.     Your cardiac CT will be scheduled at one of the below locations:   [facility] [address] [place], [place] [address] [phone]  If scheduled at [facility], please arrive at the [facility] and Children's Entrance (Entrance C2) of [facility] 30 minutes prior to test start time. You can use the FREE valet parking offered at entrance C (encouraged to control the heart rate for the test)  Proceed to the [facility] Radiology Department (first floor) to check-in and test prep.  All radiology patients and guests should use entrance C2 at [facility], accessed from [facility], even though the hospital's physical address listed is [address].     Please follow these instructions carefully (unless otherwise directed):  Hold all erectile dysfunction medications at least 3 days (72 hrs) prior to test. (Ie viagra, cialis, sildenafil, tadalafil, etc) We will administer nitroglycerin during this exam.   On the Night Before the Test: Be sure to Drink plenty  of water. Do not consume any caffeinated/decaffeinated beverages or chocolate 12 hours prior to your test. Do not take any antihistamines 12 hours prior to your test.   On the Day of the Test: Drink plenty of water until 1 hour prior to the test. Do not eat any food 1 hour prior to test. You may take your regular medications prior to the test.  Take metoprolol (Lopressor) two hours prior to test. If you take Furosemide/Hydrochlorothiazide/Spironolactone, please HOLD on the morning of the test. Do not take Losartan before CT  After the Test: Drink plenty of  water. After receiving IV contrast, you may experience a mild flushed feeling. This is normal. On occasion, you may experience a mild rash up to 24 hours after the test. This is not dangerous. If this occurs, you can take Benadryl 25 mg and increase your fluid intake. If you experience trouble breathing, this can be serious. If it is severe call 911 IMMEDIATELY. If it is mild, please call our office. If you take any of these medications: Glipizide/Metformin, Avandament, Glucavance, please do not take 48 hours after completing test unless otherwise instructed.  We will call to schedule your test 2-4 weeks out understanding that some insurance companies will need an authorization prior to the service being performed.   For non-scheduling related questions, please contact the cardiac imaging nurse navigator should you have any questions/concerns: Marchia Bond, Cardiac Imaging Nurse Navigator Gordy Clement, Cardiac Imaging Nurse Navigator  Heart and Vascular Services Direct Office Dial: [phone]     Follow-Up: At [facility], you and your health needs are our priority.  As part of our continuing mission to provide you with exceptional heart care, we have created designated Provider Care Teams.  These Care Teams include your primary Cardiologist (physician) and Advanced Practice Providers (APPs -  Physician Assistants and Nurse Practitioners) who all work together to provide you with the care you need, when you need it.  We recommend signing up for the patient portal called "MyChart".  Sign up information is provided on this After Visit Summary.  MyChart is used to connect with patients for Virtual Visits (Telemedicine).  Patients are able to view lab/test results, encounter notes, upcoming appointments, etc.  Non-urgent messages can be sent to your provider as well.   To learn more about what you can do with MyChart, go to [email or web address].    Your next appointment:    4 month(s)  Provider:   Dr Sallyanne Kuster    Signed, Sanda Klein, MD  08/19/2022 10:55 AM    [place]

## 2022-08-18 NOTE — Patient Instructions (Addendum)
Medication Instructions:  Metoprolol '100mg'$  2 hrs before CT Carvedilol 6.'25mg'$  twice a day Zetia '10mg'$  daily *If you need a refill on your cardiac medications before your next appointment, please call your pharmacy*   Lab Work: BMET- today Lipid Profine- Fasting in 3 months If you have labs (blood work) drawn today and your tests are completely normal, you will receive your results only by: [place] (if you have MyChart) OR A paper copy in the mail If you have any lab test that is abnormal or we need to change your treatment, we will call you to review the results.   Testing/Procedures: Your physician has requested that you have cardiac CT. Cardiac computed tomography (CT) is a painless test that uses an x-ray machine to take clear, detailed pictures of your heart. For further information please visit HugeFiesta.tn. Please follow instruction sheet as given.     Your cardiac CT will be scheduled at one of the below locations:   [facility] [address] [place], [place] [address] [phone]  If scheduled at [facility], please arrive at the [facility] and Children's Entrance (Entrance C2) of [facility] 30 minutes prior to test start time. You can use the FREE valet parking offered at entrance C (encouraged to control the heart rate for the test)  Proceed to the [facility] Radiology Department (first floor) to check-in and test prep.  All radiology patients and guests should use entrance C2 at [facility], accessed from [facility], even though the hospital's physical address listed is [address]     Please follow these instructions carefully (unless otherwise directed):  Hold all erectile dysfunction medications at least 3 days (72 hrs) prior to test. (Ie viagra, cialis, sildenafil, tadalafil, etc) We will administer nitroglycerin during this exam.   On the Night Before the Test: Be sure to Drink plenty  of water. Do not consume any caffeinated/decaffeinated beverages or chocolate 12 hours prior to your test. Do not take any antihistamines 12 hours prior to your test.   On the Day of the Test: Drink plenty of water until 1 hour prior to the test. Do not eat any food 1 hour prior to test. You may take your regular medications prior to the test.  Take metoprolol (Lopressor) two hours prior to test. If you take Furosemide/Hydrochlorothiazide/Spironolactone, please HOLD on the morning of the test. Do not take Losartan before CT  After the Test: Drink plenty of water. After receiving IV contrast, you may experience a mild flushed feeling. This is normal. On occasion, you may experience a mild rash up to 24 hours after the test. This is not dangerous. If this occurs, you can take Benadryl 25 mg and increase your fluid intake. If you experience trouble breathing, this can be serious. If it is severe call 911 IMMEDIATELY. If it is mild, please call our office. If you take any of these medications: Glipizide/Metformin, Avandament, Glucavance, please do not take 48 hours after completing test unless otherwise instructed.  We will call to schedule your test 2-4 weeks out understanding that some insurance companies will need an authorization prior to the service being performed.   For non-scheduling related questions, please contact the cardiac imaging nurse navigator should you have any questions/concerns: Marchia Bond, Cardiac Imaging Nurse Navigator Gordy Clement, Cardiac Imaging Nurse Navigator [place] [facility] Direct Office Dial: [phone]     Follow-Up: At [facility], you and your health  needs are our priority.  As part of our continuing mission to provide you with exceptional heart care, we have created designated Provider Care Teams.  These Care Teams include your primary Cardiologist (physician) and Advanced Practice Providers (APPs -  Physician  Assistants and Nurse Practitioners) who all work together to provide you with the care you need, when you need it.  We recommend signing up for the patient portal called "MyChart".  Sign up information is provided on this After Visit Summary.  MyChart is used to connect with patients for Virtual Visits (Telemedicine).  Patients are able to view lab/test results, encounter notes, upcoming appointments, etc.  Non-urgent messages can be sent to your provider as well.   To learn more about what you can do with MyChart, go to [email or web address].    Your next appointment:   4 month(s)  Provider:   Dr Sallyanne Kuster

## 2022-08-19 ENCOUNTER — Encounter: Payer: Self-pay | Admitting: Cardiovascular Disease

## 2022-08-19 LAB — BASIC METABOLIC PANEL
BUN/Creatinine Ratio: 19 (ref 9–20)
BUN: 20 mg/dL (ref 6–24)
CO2: 24 mmol/L (ref 20–29)
Calcium: 10.3 mg/dL — ABNORMAL HIGH (ref 8.7–10.2)
Chloride: 99 mmol/L (ref 96–106)
Creatinine, Ser: 1.07 mg/dL (ref 0.76–1.27)
Glucose: 104 mg/dL — ABNORMAL HIGH (ref 70–99)
Potassium: 4.3 mmol/L (ref 3.5–5.2)
Sodium: 139 mmol/L (ref 134–144)
eGFR: 84 mL/min/{1.73_m2} (ref >59)

## 2022-08-21 ENCOUNTER — Ambulatory Visit (HOSPITAL_COMMUNITY)
Admission: RE | Admit: 2022-08-21 | Discharge: 2022-08-21 | Disposition: A | Discharge status: Home / Self Care | Payer: 59 | Source: Ambulatory Visit | Attending: Cardiovascular Disease | Admitting: Cardiovascular Disease

## 2022-08-21 DIAGNOSIS — I25118 Atherosclerotic heart disease of native coronary artery with other forms of angina pectoris: Secondary | ICD-10-CM | POA: Diagnosis not present

## 2022-08-21 MED ORDER — NITROGLYCERIN 0.4 MG SL SUBL
0.8000 mg | SUBLINGUAL_TABLET | Freq: Once | SUBLINGUAL | Status: AC
  Administered 2022-08-21: 0.8 mg via SUBLINGUAL

## 2022-08-21 MED ORDER — IOHEXOL 350 MG/ML SOLN
100.0000 mL | Freq: Once | INTRAVENOUS | Status: AC | PRN
  Administered 2022-08-21: 100 mL via INTRAVENOUS

## 2022-08-21 MED ORDER — NITROGLYCERIN 0.4 MG SL SUBL
SUBLINGUAL_TABLET | SUBLINGUAL | Status: AC
  Filled 2022-08-21: qty 2

## 2022-09-08 ENCOUNTER — Other Ambulatory Visit: Payer: Self-pay | Admitting: Cardiovascular Disease

## 2022-09-08 ENCOUNTER — Other Ambulatory Visit: Payer: Self-pay

## 2022-09-08 ENCOUNTER — Other Ambulatory Visit (HOSPITAL_COMMUNITY): Payer: Self-pay

## 2022-09-08 MED ORDER — AMLODIPINE BESYLATE 5 MG PO TABS
5.0000 mg | ORAL_TABLET | Freq: Every day | ORAL | 3 refills | Status: DC
  Filled 2022-09-08: qty 30, 30d supply, fill #0
  Filled 2022-10-14: qty 90, 90d supply, fill #1

## 2022-09-25 ENCOUNTER — Other Ambulatory Visit (HOSPITAL_COMMUNITY): Payer: Self-pay

## 2022-09-25 ENCOUNTER — Other Ambulatory Visit: Payer: Self-pay

## 2022-09-25 ENCOUNTER — Encounter: Payer: Self-pay | Admitting: *Deleted

## 2022-09-25 DIAGNOSIS — G4733 Obstructive sleep apnea (adult) (pediatric): Secondary | ICD-10-CM

## 2022-09-25 NOTE — Telephone Encounter (Signed)
Message routed to Dr. Alva 

## 2022-09-25 NOTE — Telephone Encounter (Signed)
DME order placed for replacement cpap.  Message sent to patient to schedule sleep consult with Dr. Vassie Loll.

## 2022-09-26 ENCOUNTER — Other Ambulatory Visit: Payer: Self-pay

## 2022-09-26 NOTE — Telephone Encounter (Signed)
Message routed to PCC's. 

## 2022-09-27 ENCOUNTER — Other Ambulatory Visit (HOSPITAL_COMMUNITY): Payer: Self-pay

## 2022-09-27 ENCOUNTER — Other Ambulatory Visit: Payer: Self-pay

## 2022-09-27 MED ORDER — BUPROPION HCL ER (XL) 300 MG PO TB24
300.0000 mg | ORAL_TABLET | Freq: Every day | ORAL | 0 refills | Status: DC
  Filled 2022-09-27: qty 90, 90d supply, fill #0

## 2022-10-06 ENCOUNTER — Encounter (INDEPENDENT_AMBULATORY_CARE_PROVIDER_SITE_OTHER): Payer: 59 | Admitting: Ophthalmology

## 2022-10-06 DIAGNOSIS — H4421 Degenerative myopia, right eye: Secondary | ICD-10-CM | POA: Diagnosis not present

## 2022-10-06 DIAGNOSIS — I10 Essential (primary) hypertension: Secondary | ICD-10-CM

## 2022-10-06 DIAGNOSIS — H442A2 Degenerative myopia with choroidal neovascularization, left eye: Secondary | ICD-10-CM | POA: Diagnosis not present

## 2022-10-06 DIAGNOSIS — H33303 Unspecified retinal break, bilateral: Secondary | ICD-10-CM | POA: Diagnosis not present

## 2022-10-06 DIAGNOSIS — H43813 Vitreous degeneration, bilateral: Secondary | ICD-10-CM | POA: Diagnosis not present

## 2022-10-06 DIAGNOSIS — H35033 Hypertensive retinopathy, bilateral: Secondary | ICD-10-CM

## 2022-10-11 ENCOUNTER — Other Ambulatory Visit (HOSPITAL_COMMUNITY): Payer: Self-pay

## 2022-10-11 ENCOUNTER — Other Ambulatory Visit: Payer: Self-pay

## 2022-10-11 DIAGNOSIS — L245 Irritant contact dermatitis due to other chemical products: Secondary | ICD-10-CM | POA: Diagnosis not present

## 2022-10-11 MED ORDER — TRIAMCINOLONE ACETONIDE 0.1 % EX CREA
TOPICAL_CREAM | CUTANEOUS | 2 refills | Status: AC
  Filled 2022-10-11: qty 80, 30d supply, fill #0
  Filled 2023-04-02: qty 80, 30d supply, fill #1
  Filled 2023-05-26: qty 80, 30d supply, fill #2

## 2022-10-16 ENCOUNTER — Other Ambulatory Visit: Payer: Self-pay

## 2022-11-04 ENCOUNTER — Other Ambulatory Visit (HOSPITAL_COMMUNITY): Payer: Self-pay

## 2022-11-11 ENCOUNTER — Other Ambulatory Visit (HOSPITAL_COMMUNITY): Payer: Self-pay

## 2022-11-16 DIAGNOSIS — E782 Mixed hyperlipidemia: Secondary | ICD-10-CM | POA: Diagnosis not present

## 2022-11-17 ENCOUNTER — Other Ambulatory Visit: Payer: Self-pay

## 2022-11-17 ENCOUNTER — Encounter: Payer: Self-pay | Admitting: Cardiovascular Disease

## 2022-11-17 LAB — LIPID PANEL
Chol/HDL Ratio: 2.8 ratio (ref 0.0–5.0)
Cholesterol, Total: 124 mg/dL (ref 100–199)
HDL: 44 mg/dL (ref >39)
LDL Chol Calc (NIH): 62 mg/dL (ref 0–99)
Triglycerides: 97 mg/dL (ref 0–149)
VLDL Cholesterol Cal: 18 mg/dL (ref 5–40)

## 2022-11-20 ENCOUNTER — Other Ambulatory Visit (HOSPITAL_COMMUNITY): Payer: Self-pay

## 2022-11-20 MED ORDER — ROSUVASTATIN CALCIUM 20 MG PO TABS
20.0000 mg | ORAL_TABLET | Freq: Every day | ORAL | 3 refills | Status: DC
  Filled 2022-11-20: qty 90, 90d supply, fill #0
  Filled 2023-02-10: qty 90, 90d supply, fill #1
  Filled 2023-05-26: qty 90, 90d supply, fill #2
  Filled 2023-08-22: qty 90, 90d supply, fill #3

## 2022-11-23 ENCOUNTER — Other Ambulatory Visit (HOSPITAL_COMMUNITY): Payer: Self-pay

## 2022-11-23 MED ORDER — PANTOPRAZOLE SODIUM 40 MG PO TBEC
40.0000 mg | DELAYED_RELEASE_TABLET | Freq: Every day | ORAL | 0 refills | Status: DC
  Filled 2022-11-23: qty 90, 90d supply, fill #0

## 2023-01-03 ENCOUNTER — Other Ambulatory Visit (HOSPITAL_COMMUNITY): Payer: Self-pay

## 2023-01-03 ENCOUNTER — Telehealth: Payer: Self-pay | Admitting: Cardiovascular Disease

## 2023-01-03 DIAGNOSIS — U071 COVID-19: Secondary | ICD-10-CM | POA: Diagnosis not present

## 2023-01-03 DIAGNOSIS — I10 Essential (primary) hypertension: Secondary | ICD-10-CM | POA: Diagnosis not present

## 2023-01-03 DIAGNOSIS — F325 Major depressive disorder, single episode, in full remission: Secondary | ICD-10-CM | POA: Diagnosis not present

## 2023-01-03 DIAGNOSIS — E669 Obesity, unspecified: Secondary | ICD-10-CM | POA: Diagnosis not present

## 2023-01-03 DIAGNOSIS — J4520 Mild intermittent asthma, uncomplicated: Secondary | ICD-10-CM | POA: Diagnosis not present

## 2023-01-03 MED ORDER — PAXLOVID (300/100) 20 X 150 MG & 10 X 100MG PO TBPK
3.0000 | ORAL_TABLET | Freq: Two times a day (BID) | ORAL | 0 refills | Status: AC
  Filled 2023-01-03: qty 30, 5d supply, fill #0

## 2023-01-03 NOTE — Telephone Encounter (Signed)
Fine to switch to virtual

## 2023-01-03 NOTE — Telephone Encounter (Signed)
Pt states he just tested positive for COVID and would like to know if he can change the appointment on the 7/31 to virtual. Please advise.

## 2023-01-10 ENCOUNTER — Other Ambulatory Visit (HOSPITAL_COMMUNITY): Payer: Self-pay

## 2023-01-10 ENCOUNTER — Encounter: Payer: Self-pay | Admitting: Emergency Medicine

## 2023-01-10 ENCOUNTER — Other Ambulatory Visit: Payer: Self-pay | Admitting: Cardiovascular Disease

## 2023-01-10 ENCOUNTER — Encounter: Payer: Self-pay | Admitting: Cardiovascular Disease

## 2023-01-10 ENCOUNTER — Ambulatory Visit: Payer: 59 | Attending: Cardiovascular Disease | Admitting: Cardiovascular Disease

## 2023-01-10 VITALS — BP 117/84 | HR 80 | Ht 69.0 in | Wt 218.0 lb

## 2023-01-10 DIAGNOSIS — E782 Mixed hyperlipidemia: Secondary | ICD-10-CM

## 2023-01-10 DIAGNOSIS — I25118 Atherosclerotic heart disease of native coronary artery with other forms of angina pectoris: Secondary | ICD-10-CM

## 2023-01-10 DIAGNOSIS — G4733 Obstructive sleep apnea (adult) (pediatric): Secondary | ICD-10-CM | POA: Diagnosis not present

## 2023-01-10 DIAGNOSIS — I10 Essential (primary) hypertension: Secondary | ICD-10-CM

## 2023-01-10 MED ORDER — CITALOPRAM HYDROBROMIDE 40 MG PO TABS
40.0000 mg | ORAL_TABLET | Freq: Every day | ORAL | 0 refills | Status: DC
  Filled 2023-01-10: qty 90, 90d supply, fill #0

## 2023-01-10 NOTE — Progress Notes (Signed)
Sent AVS through MyChart and put in orders for Lipid and BMP in one year

## 2023-01-10 NOTE — Progress Notes (Signed)
Cardiology Office Note:    Date:  01/10/2023   ID:  [name], DOB 1971/03/17, MRN [id number]  PCP:  Joshua Hazel, Knox   [facility] Providers Cardiologist:  Joshua Noe, Knox (Inactive)     Referring Knox: Joshua Hazel, Knox      Virtual Visit via Video Note   Because of [name] co-morbid illnesses, he is at least at moderate risk for complications without adequate follow up.  This format is felt to be most appropriate for this patient at this time.  All issues noted in this document were discussed and addressed.  A limited physical exam was performed with this format.  Please refer to the patient's chart for his consent to telehealth for [facility].       Date:  01/10/2023   ID:  [name], DOB Dec 12, 1970, MRN [id number] The patient was identified using 2 identifiers.  Patient Location: Home Provider Location: Office/Clinic   PCP:  Joshua Hazel, Knox   [facility] Providers Cardiologist:  Joshua Noe, Knox (Inactive)     Evaluation Performed:  Follow-Up Visit  Chief Complaint:  CAD    [facility]    History of Present Illness:    [name] is a 52 y.o. male with a hx of early onset coronary atherosclerosis,, "coronary syndrome X" related chest pain, myocardial bridge, hypercholesterolemia, hypertension, OSA previously cared for by [name], transitioning his care today after [name] retirement.  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  [name] asked to change this appointment to a video visit since he has COVID infection.  His symptoms are fairly mild.  He has not had any recent problems with chest pain or shortness of breath.  He denies palpitations, dizziness, syncope, claudication or lower extremity edema.  His episodes of chest pain have been rather unpredictable, occur at rest, lasting maximum bouts 15 minutes and generally resolving spontaneously.  They  have been reduced in frequency after he started treatment with isosorbide.  He reports compliance with CPAP and denies daytime hypersomnolence.  His blood pressure has been very well-controlled.  He has not had problems with headaches.  His most recent lipid profile showed a total cholesterol 124, HDL 44, LDL 62.  He does not have diabetes.  He stopped smoking 2007 (roughly 30-pack-year history before that).  [name] is had problems with chest pain as far back as 2011 when he was only 52 years old.  His coronary calcium score is high placing him in the 88th percentile for age, but previous invasive angiography in 2011 and coronary CT angiogram in 2021 did not show evidence of severe coronary stenoses.  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   He had normal PFTs in March 2022.   He has a strong family history of early onset CAD.  His brother had a myocardial infarction at age 21 and has received 5 stents.  His paternal grandfather died of a ruptured AAA.  His father died of lung cancer at age 24 (probably due to a combination of smoking and environmental exposure).    Past Medical History:  Diagnosis Date   Asthma    Coronary artery disease CARDIOLOGIST - DR [name]--- LAST VISIT OCT 2012   DENIES S & S   Depression    Dysuria    GERD (gastroesophageal reflux disease)    H/O hiatal hernia  History of angina pectoris    Hypertension    Kidney calculi    Left ureteral calculus    Myocardial bridge MID LAD   Shortness of breath    with excertion    Past Surgical History:  Procedure Laterality Date   CARDIAC CATHETERIZATION  01-07-2010-- DR [name]   NORMAL LVF/ PATENT CORONARY ARTERIES/ MYOCARDIAL BRIDGE MID LAD   CYSTOSCOPY W/ URETERAL STENT PLACEMENT  08/15/2011   Procedure: CYSTOSCOPY WITH STENT REPLACEMENT;  Surgeon: [name];  Location: [facility];  Service: Urology;  Laterality: Left;  stone basket extraction left, left ureteroscopy    EXTRACORPOREAL SHOCK WAVE LITHOTRIPSY  08-07-11   LEFT   VERICOCELECTOMY  1992    Current Medications: Current Meds  Medication Sig   amLODipine (NORVASC) 5 MG tablet Take 1 tablet (5 mg total) by mouth daily. Please call our office to schedule an overdue appointment with Dr. Katrinka Knox before anymore refills. [phone].   buPROPion (WELLBUTRIN XL) 300 MG 24 hr tablet Take 1 tablet (300 mg total) by mouth daily.   carvedilol (COREG) 6.25 MG tablet Take 1 tablet (6.25 mg total) by mouth 2 (two) times daily.   citalopram (CELEXA) 40 MG tablet Take 1 tablet (40 mg total) by mouth daily.   clobetasol cream (TEMOVATE) 0.05 % Apply 1 application  topically as needed.   Coenzyme Q10 (CO Q 10) 100 MG CAPS Take 1 capsule by mouth daily in the afternoon.   ezetimibe (ZETIA) 10 MG tablet Take 1 tablet (10 mg total) by mouth daily.   glucosamine-chondroitin 500-400 MG tablet Take 1 tablet by mouth daily at 6 (six) AM.   ibuprofen (ADVIL) 800 MG tablet Take 800 mg by mouth as needed.   isosorbide mononitrate (IMDUR) 30 MG 24 hr tablet Take 1 tablet (30 mg total) by mouth daily.   losartan (COZAAR) 100 MG tablet Take 1 tablet (100 mg total) by mouth daily.   Multiple Vitamin (MULTIVITAMIN) tablet Take 1 tablet by mouth daily.   pantoprazole (PROTONIX) 40 MG tablet Take 1 tablet (40 mg total) by mouth daily.   rosuvastatin (CRESTOR) 20 MG tablet Take 1 tablet (20 mg total) by mouth daily.     Allergies:   Ansaid [ocufen], Chantix [varenicline tartrate], Codeine, and Erythromycin   Social History   Socioeconomic History   Marital status: Married    Spouse name: Not on file   Number of children: 0   Years of education: Not on file   Highest education level: Not on file  Occupational History   Not on file  Tobacco Use   Smoking status: Former    Current packs/day: 0.00    Average packs/day: 3.0 packs/day for 10.0 years (30.0 ttl pk-yrs)    Types: Cigarettes, Cigars    Start date: 08/14/1995     Quit date: 08/13/2005    Years since quitting: 17.4   Smokeless tobacco: Former  Building services engineer status: Never Used  Substance and Sexual Activity   Alcohol use: No    Alcohol/week: 0.0 standard drinks of alcohol   Drug use: No   Sexual activity: Not on file  Other Topics Concern   Not on file  Social History Narrative   Not on file   Social Determinants of Health   Financial Resource Strain: Not on file  Food Insecurity: Not on file  Transportation Needs: Not on file  Physical Activity: Not on file  Stress: Not on file  Social Connections:  Not on file     Family History: The patient's family history includes Alzheimer's disease in his maternal grandmother; Aortic aneurysm in his paternal grandfather; Cancer - Lung in his father; Emphysema in his paternal grandfather; Heart attack in his brother; Hypertension in his mother and paternal grandmother; Stroke in his maternal grandfather.  ROS:   Please see the history of present illness.     All other systems reviewed and are negative.  EKGs/Labs/Other Studies Reviewed:    The following studies were reviewed today: Coronary CT angiogram 08/24/2022  IMPRESSION: 1. Calcium score 83.4 which is 85 th percentile for age/sex small area of mid LAD calcium not registered   2.  Normal ascending thoracic aorta 3.2 cm   3.  CAD RADS 1 non obstructive CAD in left dominant circulation   4.  No bridging for the mid LAD appreciated on cine images  EKG:  EKG is not ordered today.  The ekg ordered 08/18/2022 demonstrates normal sinus rhythm, normal tracing   Recent Labs: 08/18/2022: BUN 20; Creatinine, Ser 1.07; Potassium 4.3; Sodium 139  Recent Lipid Panel    Component Value Date/Time   CHOL 124 11/16/2022 0800   TRIG 97 11/16/2022 0800   HDL 44 11/16/2022 0800   CHOLHDL 2.8 11/16/2022 0800   CHOLHDL 5.3 (H) 01/12/2017 0819   VLDL 34 (H) 01/12/2017 0819   LDLCALC 62 11/16/2022 0800     Risk Assessment/Calculations:                 Physical Exam:    VS:  BP 117/84 (BP Location: Right Arm, Patient Position: Sitting, Cuff Size: Normal)   Pulse 80   Ht 5\' 9"  (1.753 m)   Wt 218 lb (98.9 kg)   SpO2 97%   BMI 32.19 kg/m     Wt Readings from Last 3 Encounters:  01/10/23 218 lb (98.9 kg)  08/18/22 224 lb 3.2 oz (101.7 kg)  05/29/22 221 lb (100.2 kg)     VITAL SIGNS:  reviewed GEN:  no acute distress EYES:  sclerae anicteric, EOMI - Extraocular Movements Intact RESPIRATORY:  normal respiratory effort, symmetric expansion CARDIOVASCULAR:  no peripheral edema SKIN:  no rash, lesions or ulcers. MUSCULOSKELETAL:  no obvious deformities. NEURO:  alert and oriented x 3, no obvious focal deficit PSYCH:  normal affect  ASSESSMENT:    1. Coronary artery disease involving native coronary artery of native heart with other form of angina pectoris (HCC)   2. Mixed hyperlipidemia   3. Essential hypertension   4. OSA on CPAP     PLAN:    In order of problems listed above:  CAD: Symptoms improved substantially after he started long-acting nitrates and carvedilol.  He has age-advanced coronary atherosclerosis, but has not had any evidence of significant stenoses by angiography in 2011 and coronary CT angiography most recently in March 2024.  May have microvascular angina and/or coronary vasospasm.  HLP: LDL is well within target range.  HDL remains a little on the low side and will improve with weight loss and more physical activity. HTN: Well-controlled. OSA: Reports compliance with CPAP and denies daytime hypersomnolence. Obesity: Weight loss would have multiple positive effects from his cardiovascular health.     Video telehealth visit duration 17 minutes      Medication Adjustments/Labs and Tests Ordered: Current medicines are reviewed at length with the patient today.  Concerns regarding medicines are outlined above.  Orders Placed This Encounter  Procedures   Lipid panel   Basic  metabolic  panel   No orders of the defined types were placed in this encounter.   There are no Patient Instructions on file for this visit.   Signed, Thurmon Fair, Knox  01/10/2023 12:04 PM    [facility]

## 2023-01-11 ENCOUNTER — Other Ambulatory Visit: Payer: Self-pay

## 2023-01-11 ENCOUNTER — Other Ambulatory Visit (HOSPITAL_COMMUNITY): Payer: Self-pay

## 2023-01-11 MED ORDER — AMLODIPINE BESYLATE 5 MG PO TABS
5.0000 mg | ORAL_TABLET | Freq: Every day | ORAL | 2 refills | Status: DC
  Filled 2023-01-11: qty 90, 90d supply, fill #0
  Filled 2023-04-21: qty 90, 90d supply, fill #1
  Filled 2023-07-28: qty 90, 90d supply, fill #2

## 2023-01-11 MED ORDER — BUPROPION HCL ER (XL) 300 MG PO TB24
300.0000 mg | ORAL_TABLET | Freq: Every day | ORAL | 0 refills | Status: DC
  Filled 2023-01-11: qty 90, 90d supply, fill #0

## 2023-01-20 DIAGNOSIS — S52134A Nondisplaced fracture of neck of right radius, initial encounter for closed fracture: Secondary | ICD-10-CM | POA: Diagnosis not present

## 2023-01-20 DIAGNOSIS — S62101A Fracture of unspecified carpal bone, right wrist, initial encounter for closed fracture: Secondary | ICD-10-CM | POA: Diagnosis not present

## 2023-01-27 ENCOUNTER — Other Ambulatory Visit (HOSPITAL_COMMUNITY): Payer: Self-pay

## 2023-02-06 DIAGNOSIS — S52134D Nondisplaced fracture of neck of right radius, subsequent encounter for closed fracture with routine healing: Secondary | ICD-10-CM | POA: Diagnosis not present

## 2023-02-06 DIAGNOSIS — S62101D Fracture of unspecified carpal bone, right wrist, subsequent encounter for fracture with routine healing: Secondary | ICD-10-CM | POA: Diagnosis not present

## 2023-02-07 ENCOUNTER — Other Ambulatory Visit (HOSPITAL_COMMUNITY): Payer: Self-pay

## 2023-02-10 ENCOUNTER — Other Ambulatory Visit (HOSPITAL_COMMUNITY): Payer: Self-pay

## 2023-02-13 ENCOUNTER — Other Ambulatory Visit: Payer: Self-pay

## 2023-02-14 ENCOUNTER — Other Ambulatory Visit (HOSPITAL_COMMUNITY): Payer: Self-pay

## 2023-02-14 MED ORDER — PANTOPRAZOLE SODIUM 40 MG PO TBEC
40.0000 mg | DELAYED_RELEASE_TABLET | Freq: Every day | ORAL | 0 refills | Status: DC
  Filled 2023-02-14: qty 30, 30d supply, fill #0

## 2023-02-16 ENCOUNTER — Encounter (INDEPENDENT_AMBULATORY_CARE_PROVIDER_SITE_OTHER): Payer: 59 | Admitting: Ophthalmology

## 2023-02-16 ENCOUNTER — Other Ambulatory Visit: Payer: Self-pay | Admitting: Medical Genetics

## 2023-02-16 DIAGNOSIS — I10 Essential (primary) hypertension: Secondary | ICD-10-CM

## 2023-02-16 DIAGNOSIS — H442A2 Degenerative myopia with choroidal neovascularization, left eye: Secondary | ICD-10-CM | POA: Diagnosis not present

## 2023-02-16 DIAGNOSIS — H43813 Vitreous degeneration, bilateral: Secondary | ICD-10-CM | POA: Diagnosis not present

## 2023-02-16 DIAGNOSIS — H35033 Hypertensive retinopathy, bilateral: Secondary | ICD-10-CM | POA: Diagnosis not present

## 2023-02-16 DIAGNOSIS — Z006 Encounter for examination for normal comparison and control in clinical research program: Secondary | ICD-10-CM

## 2023-02-16 DIAGNOSIS — H33303 Unspecified retinal break, bilateral: Secondary | ICD-10-CM | POA: Diagnosis not present

## 2023-02-16 DIAGNOSIS — H4421 Degenerative myopia, right eye: Secondary | ICD-10-CM | POA: Diagnosis not present

## 2023-02-19 ENCOUNTER — Other Ambulatory Visit (HOSPITAL_COMMUNITY)
Admission: RE | Admit: 2023-02-19 | Discharge: 2023-02-19 | Disposition: A | Discharge status: Home / Self Care | Payer: 59 | Source: Ambulatory Visit | Attending: Oncology | Admitting: Oncology

## 2023-02-19 DIAGNOSIS — Z006 Encounter for examination for normal comparison and control in clinical research program: Secondary | ICD-10-CM | POA: Insufficient documentation

## 2023-02-27 ENCOUNTER — Other Ambulatory Visit: Payer: Self-pay

## 2023-02-27 DIAGNOSIS — S52135D Nondisplaced fracture of neck of left radius, subsequent encounter for closed fracture with routine healing: Secondary | ICD-10-CM | POA: Diagnosis not present

## 2023-02-27 DIAGNOSIS — S52134D Nondisplaced fracture of neck of right radius, subsequent encounter for closed fracture with routine healing: Secondary | ICD-10-CM | POA: Diagnosis not present

## 2023-02-27 DIAGNOSIS — S62101D Fracture of unspecified carpal bone, right wrist, subsequent encounter for fracture with routine healing: Secondary | ICD-10-CM | POA: Diagnosis not present

## 2023-02-27 LAB — GENECONNECT MOLECULAR SCREEN: Genetic Analysis Overall Interpretation: NEGATIVE

## 2023-04-02 ENCOUNTER — Other Ambulatory Visit: Payer: Self-pay

## 2023-04-02 ENCOUNTER — Other Ambulatory Visit (HOSPITAL_COMMUNITY): Payer: Self-pay

## 2023-04-02 DIAGNOSIS — J4520 Mild intermittent asthma, uncomplicated: Secondary | ICD-10-CM | POA: Diagnosis not present

## 2023-04-02 DIAGNOSIS — Z6833 Body mass index (BMI) 33.0-33.9, adult: Secondary | ICD-10-CM | POA: Diagnosis not present

## 2023-04-02 DIAGNOSIS — G43009 Migraine without aura, not intractable, without status migrainosus: Secondary | ICD-10-CM | POA: Diagnosis not present

## 2023-04-02 DIAGNOSIS — I10 Essential (primary) hypertension: Secondary | ICD-10-CM | POA: Diagnosis not present

## 2023-04-02 DIAGNOSIS — K219 Gastro-esophageal reflux disease without esophagitis: Secondary | ICD-10-CM | POA: Diagnosis not present

## 2023-04-02 DIAGNOSIS — G4730 Sleep apnea, unspecified: Secondary | ICD-10-CM | POA: Diagnosis not present

## 2023-04-02 DIAGNOSIS — Z0000 Encounter for general adult medical examination without abnormal findings: Secondary | ICD-10-CM | POA: Diagnosis not present

## 2023-04-02 DIAGNOSIS — F329 Major depressive disorder, single episode, unspecified: Secondary | ICD-10-CM | POA: Diagnosis not present

## 2023-04-02 DIAGNOSIS — Z23 Encounter for immunization: Secondary | ICD-10-CM | POA: Diagnosis not present

## 2023-04-02 DIAGNOSIS — E785 Hyperlipidemia, unspecified: Secondary | ICD-10-CM | POA: Diagnosis not present

## 2023-04-03 ENCOUNTER — Other Ambulatory Visit: Payer: Self-pay

## 2023-04-04 ENCOUNTER — Other Ambulatory Visit (HOSPITAL_COMMUNITY): Payer: Self-pay

## 2023-04-04 DIAGNOSIS — S52134D Nondisplaced fracture of neck of right radius, subsequent encounter for closed fracture with routine healing: Secondary | ICD-10-CM | POA: Diagnosis not present

## 2023-04-04 MED ORDER — PANTOPRAZOLE SODIUM 40 MG PO TBEC
40.0000 mg | DELAYED_RELEASE_TABLET | Freq: Every day | ORAL | 0 refills | Status: DC
  Filled 2023-04-04: qty 30, 30d supply, fill #0

## 2023-04-05 ENCOUNTER — Other Ambulatory Visit (HOSPITAL_COMMUNITY): Payer: Self-pay

## 2023-04-05 ENCOUNTER — Other Ambulatory Visit: Payer: Self-pay

## 2023-04-05 MED ORDER — BUPROPION HCL ER (XL) 300 MG PO TB24
300.0000 mg | ORAL_TABLET | Freq: Every day | ORAL | 0 refills | Status: DC
  Filled 2023-04-05: qty 90, 90d supply, fill #0

## 2023-04-21 ENCOUNTER — Other Ambulatory Visit (HOSPITAL_COMMUNITY): Payer: Self-pay

## 2023-04-23 ENCOUNTER — Other Ambulatory Visit (HOSPITAL_COMMUNITY): Payer: Self-pay

## 2023-04-23 ENCOUNTER — Other Ambulatory Visit: Payer: Self-pay

## 2023-04-23 MED ORDER — CITALOPRAM HYDROBROMIDE 40 MG PO TABS
40.0000 mg | ORAL_TABLET | Freq: Every day | ORAL | 1 refills | Status: DC
  Filled 2023-04-23: qty 90, 90d supply, fill #0
  Filled 2023-07-28: qty 90, 90d supply, fill #1

## 2023-04-23 MED ORDER — PANTOPRAZOLE SODIUM 40 MG PO TBEC
40.0000 mg | DELAYED_RELEASE_TABLET | Freq: Every day | ORAL | 0 refills | Status: DC
  Filled 2023-04-23: qty 90, 90d supply, fill #0

## 2023-05-01 ENCOUNTER — Other Ambulatory Visit: Payer: Self-pay

## 2023-05-12 ENCOUNTER — Other Ambulatory Visit (HOSPITAL_COMMUNITY): Payer: Self-pay

## 2023-05-26 ENCOUNTER — Other Ambulatory Visit (HOSPITAL_COMMUNITY): Payer: Self-pay

## 2023-05-28 DIAGNOSIS — H2513 Age-related nuclear cataract, bilateral: Secondary | ICD-10-CM | POA: Diagnosis not present

## 2023-05-28 DIAGNOSIS — H43813 Vitreous degeneration, bilateral: Secondary | ICD-10-CM | POA: Diagnosis not present

## 2023-05-28 DIAGNOSIS — H3562 Retinal hemorrhage, left eye: Secondary | ICD-10-CM | POA: Diagnosis not present

## 2023-05-28 DIAGNOSIS — H5213 Myopia, bilateral: Secondary | ICD-10-CM | POA: Diagnosis not present

## 2023-05-28 DIAGNOSIS — H35413 Lattice degeneration of retina, bilateral: Secondary | ICD-10-CM | POA: Diagnosis not present

## 2023-05-28 DIAGNOSIS — H0102A Squamous blepharitis right eye, upper and lower eyelids: Secondary | ICD-10-CM | POA: Diagnosis not present

## 2023-05-28 DIAGNOSIS — H0102B Squamous blepharitis left eye, upper and lower eyelids: Secondary | ICD-10-CM | POA: Diagnosis not present

## 2023-05-28 DIAGNOSIS — H4423 Degenerative myopia, bilateral: Secondary | ICD-10-CM | POA: Diagnosis not present

## 2023-06-17 ENCOUNTER — Telehealth: Payer: 59 | Admitting: Physician Assistant

## 2023-06-17 DIAGNOSIS — J069 Acute upper respiratory infection, unspecified: Secondary | ICD-10-CM

## 2023-06-17 MED ORDER — AZELASTINE HCL 0.1 % NA SOLN: 2.0000 | Freq: Two times a day (BID) | NASAL | 12 refills | Status: AC

## 2023-06-17 MED ORDER — BENZONATATE 100 MG PO CAPS: 100.0000 mg | ORAL_CAPSULE | Freq: Two times a day (BID) | ORAL | 0 refills | Status: AC | PRN

## 2023-06-17 MED ORDER — METHYLPREDNISOLONE 4 MG PO TBPK: ORAL_TABLET | ORAL | 0 refills | Status: DC

## 2023-06-17 NOTE — Progress Notes (Signed)
 E-Visit for Cough   We are sorry that you are not feeling well.  Here is how we plan to help!  Based on your presentation I believe you most likely have A cough due to a virus.  This is called viral bronchitis and is best treated by rest, plenty of fluids and control of the cough.  You may use Ibuprofen or Tylenol as directed to help your symptoms.     In addition you may use A prescription cough medication called Tessalon Perles 100mg . You may take 1-2 capsules every 8 hours as needed for your cough.   Directions for 6 day taper: Day 1: 2 tablets before breakfast, 1 after both lunch & dinner and 2 at bedtime Day 2: 1 tab before breakfast, 1 after both lunch & dinner and 2 at bedtime Day 3: 1 tab at each meal & 1 at bedtime Day 4: 1 tab at breakfast, 1 at lunch, 1 at bedtime Day 5: 1 tab at breakfast & 1 tab at bedtime Day 6: 1 tab at breakfast  From your responses in the eVisit questionnaire you describe inflammation in the upper respiratory tract which is causing a significant cough.  This is commonly called Bronchitis and has four common causes:   Allergies Viral Infections Acid Reflux Bacterial Infection Allergies, viruses and acid reflux are treated by controlling symptoms or eliminating the cause. An example might be a cough caused by taking certain blood pressure medications. You stop the cough by changing the medication. Another example might be a cough caused by acid reflux. Controlling the reflux helps control the cough.  USE OF BRONCHODILATOR ("RESCUE") INHALERS: There is a risk from using your bronchodilator too frequently.  The risk is that over-reliance on a medication which only relaxes the muscles surrounding the breathing tubes can reduce the effectiveness of medications prescribed to reduce swelling and congestion of the tubes themselves.  Although you feel brief relief from the bronchodilator inhaler, your asthma may actually be worsening with the tubes becoming more  swollen and filled with mucus.  This can delay other crucial treatments, such as oral steroid medications. If you need to use a bronchodilator inhaler daily, several times per day, you should discuss this with your provider.  There are probably better treatments that could be used to keep your asthma under control.     HOME CARE Only take medications as instructed by your medical team. Complete the entire course of an antibiotic. Drink plenty of fluids and get plenty of rest. Avoid close contacts especially the very young and the elderly Cover your mouth if you cough or cough into your sleeve. Always remember to wash your hands A steam or ultrasonic humidifier can help congestion.   GET HELP RIGHT AWAY IF: You develop worsening fever. You become short of breath You cough up blood. Your symptoms persist after you have completed your treatment plan MAKE SURE YOU  Understand these instructions. Will watch your condition. Will get help right away if you are not doing well or get worse.    Thank you for choosing an e-visit.  Your e-visit answers were reviewed by a board certified advanced clinical practitioner to complete your personal care plan. Depending upon the condition, your plan could have included both over the counter or prescription medications.  Please review your pharmacy choice. Make sure the pharmacy is open so you can pick up prescription now. If there is a problem, you may contact your provider through [employer] and have the  prescription routed to another pharmacy.  Your safety is important to [place]. If you have drug allergies check your prescription carefully.   For the next 24 hours you can use MyChart to ask questions about today's visit, request a non-urgent call back, or ask for a work or school excuse. You will get an email in the next two days asking about your experience. I hope that your e-visit has been valuable and will speed your recovery.

## 2023-06-17 NOTE — Progress Notes (Signed)
 I have spent 5 minutes in review of e-visit questionnaire, review and updating patient chart, medical decision making and response to patient.   Laure Kidney, PA-C

## 2023-06-19 ENCOUNTER — Encounter (HOSPITAL_BASED_OUTPATIENT_CLINIC_OR_DEPARTMENT_OTHER): Payer: Self-pay | Admitting: Pulmonary Disease

## 2023-06-19 ENCOUNTER — Ambulatory Visit (HOSPITAL_BASED_OUTPATIENT_CLINIC_OR_DEPARTMENT_OTHER): Payer: 59 | Admitting: Pulmonary Disease

## 2023-06-19 VITALS — BP 132/82 | HR 88 | Resp 16 | Ht 69.0 in | Wt 236.8 lb

## 2023-06-19 DIAGNOSIS — G4733 Obstructive sleep apnea (adult) (pediatric): Secondary | ICD-10-CM

## 2023-06-19 DIAGNOSIS — J4521 Mild intermittent asthma with (acute) exacerbation: Secondary | ICD-10-CM

## 2023-06-19 MED ORDER — BUDESONIDE-FORMOTEROL FUMARATE 160-4.5 MCG/ACT IN AERO
2.0000 | INHALATION_SPRAY | Freq: Two times a day (BID) | RESPIRATORY_TRACT | 3 refills | Status: DC
  Filled 2023-07-18: qty 10.2, 30d supply, fill #0
  Filled 2023-08-20: qty 10.2, 30d supply, fill #1
  Filled 2023-09-15: qty 10.2, 30d supply, fill #2

## 2023-06-19 MED ORDER — PREDNISONE 10 MG PO TABS: ORAL_TABLET | ORAL | 0 refills | Status: DC

## 2023-06-19 NOTE — Patient Instructions (Signed)
 Prednisone 10 mg tabs Take 4 tabs  daily with food x 4 days, then 3 tabs daily x 4 days, then 2 tabs daily x 4 days, then 1 tab daily x4 days then stop. #40   Rx for symbicort 160 - 2 puffs twice daily , rinse mouth after use

## 2023-06-19 NOTE — Progress Notes (Signed)
 Subjective:    Patient ID: [name], male    DOB: [birth year], 53 y.o.   MRN: [id number]  HPI  53 yo man for FU of  moderate OSA & exercise induced Asthma   PMH : CAD ,myocardial bridge   Former smoker- max 3 PPD , quit 2007 .  Chief Complaint  Patient presents with   Acute Visit    Asthma flare- URI symptoms last Wednesday covid test Wednesday and Friday both negative. Congestion has settled in chest Sunday started medrol  dose pack and albuterol  every 2-3 hours. Sitting he is good, walking he starts coughing. Having to use his inhaler too much.    Was on ICS but not using for many years.    Discussed the use of AI scribe software for clinical note transcription with the patient, who gave verbal consent to proceed.  History of Present Illness   The patient, with a history of asthma, presents with a week-long episode of respiratory symptoms. He reports that he initially experienced congestion and drainage, which typically precedes chest symptoms. However, this episode has been unusually prolonged. He describes coughing, shortness of breath, and a sensation of tightness in the neck and chest. The cough produces mainly clear mucus, occasionally with a green tinge. He has been using his albuterol  inhaler more frequently due to these symptoms.  An e-visit was conducted, and he was prescribed a Medrol  dose pack, which he started two days ago. Despite this, he continues to experience breathing difficulties, particularly when walking, which can trigger coughing spasms.  The patient also reports three episodes in the past three months where he woke up in the middle of the night with severe difficulty breathing, lasting about five minutes. These episodes involved a lot of coughing and gasping for breath. He uses a CPAP machine, which has been working well overall.  He has been trying to increase his physical activity but has noticed more tightness with exercise. He has also gained weight  since his last visit.       Significant tests/ events   Home sleep study 05/2014 showed moderate obstructive sleep apnea, events were about 14 per hour.   AutoCPAP download 07/2014 >> good usage >7h, no residuals, avg 8 cm   10/2014 CPAP 8 cm>> no residulas or leak, good usage PFTs 08/2020 nml   PFTs 12/2015 nml  Review of Systems neg for any significant sore throat, dysphagia, itching, sneezing, nasal congestion or excess/ purulent secretions, fever, chills, sweats, unintended wt loss, pleuritic or exertional cp, hempoptysis, orthopnea pnd or change in chronic leg swelling. Also denies presyncope, palpitations, heartburn, abdominal pain, nausea, vomiting, diarrhea or change in bowel or urinary habits, dysuria,hematuria, rash, arthralgias, visual complaints, headache, numbness weakness or ataxia.     Objective:   Physical Exam  Gen. Pleasant, well-nourished, in no distress ENT - no thrush, no pallor/icterus,no post nasal drip Neck: No JVD, no thyromegaly, no carotid bruits Lungs: no use of accessory muscles, no dullness to percussion, BL scattered rhonchi  Cardiovascular: Rhythm regular, heart sounds  normal, no murmurs or gallops, no peripheral edema Musculoskeletal: No deformities, no cyanosis or clubbing        Assessment & Plan:    Assessment and Plan    Asthma Exacerbation Asthma exacerbation triggered by a viral infection. Symptoms include congestion, drainage, coughing, shortness of breath, and chest tightness. Increased albuterol  use and started Medrol  dose pack on Sunday. Symptoms persisted longer than usual. Well-controlled asthma with infrequent long-acting inhaler use. Discussed  need for long-acting bronchodilator and potential benefits of Symbicort , including evidence of effectiveness when used diligently for a few weeks and then as needed. Discussed risks of frequent albuterol  use and benefits of steroid taper for inflammation management. - Prescribe Symbicort  for  diligent use for a few weeks, then as needed - Prescribe prednisone  taper: 40 mg for 4 days, 30 mg for 4 days, 20 mg for 4 days, 10 mg for 4 days - Advise to stop Medrol  if symptoms improve and start prednisone  taper - Instruct to call if coughing up yellow or green sputum for potential antibiotic prescription  Obstructive Sleep Apnea Three episodes in the last three months of waking up with difficulty breathing and coughing while using CPAP. CPAP data shows low events (1 event per hour). Uses [employer] software to monitor CPAP data. CPAP functioning well; episodes may not be related to apnea. - Continue current CPAP therapy he is very compliant & CPAP very beneficial - Monitor for changes in symptoms or CPAP data  General Health Maintenance Weight gain and attempting to resume regular exercise, including biking. Discussed importance of regular physical activity and weight management. - Encourage regular physical activity and weight management.

## 2023-06-29 ENCOUNTER — Encounter (INDEPENDENT_AMBULATORY_CARE_PROVIDER_SITE_OTHER): Payer: 59 | Admitting: Ophthalmology

## 2023-06-29 DIAGNOSIS — H43813 Vitreous degeneration, bilateral: Secondary | ICD-10-CM | POA: Diagnosis not present

## 2023-06-29 DIAGNOSIS — H33303 Unspecified retinal break, bilateral: Secondary | ICD-10-CM

## 2023-06-29 DIAGNOSIS — H442A2 Degenerative myopia with choroidal neovascularization, left eye: Secondary | ICD-10-CM

## 2023-06-29 DIAGNOSIS — H35033 Hypertensive retinopathy, bilateral: Secondary | ICD-10-CM

## 2023-06-29 DIAGNOSIS — H4421 Degenerative myopia, right eye: Secondary | ICD-10-CM

## 2023-06-29 DIAGNOSIS — I10 Essential (primary) hypertension: Secondary | ICD-10-CM | POA: Diagnosis not present

## 2023-07-06 ENCOUNTER — Other Ambulatory Visit (HOSPITAL_BASED_OUTPATIENT_CLINIC_OR_DEPARTMENT_OTHER): Payer: Self-pay

## 2023-07-06 ENCOUNTER — Other Ambulatory Visit: Payer: Self-pay

## 2023-07-06 ENCOUNTER — Other Ambulatory Visit (HOSPITAL_COMMUNITY): Payer: Self-pay

## 2023-07-06 ENCOUNTER — Encounter: Payer: Self-pay | Admitting: Pharmacist

## 2023-07-11 ENCOUNTER — Other Ambulatory Visit: Payer: Self-pay

## 2023-07-17 ENCOUNTER — Other Ambulatory Visit (HOSPITAL_COMMUNITY): Payer: Self-pay

## 2023-07-18 ENCOUNTER — Other Ambulatory Visit: Payer: Self-pay

## 2023-07-18 ENCOUNTER — Other Ambulatory Visit (HOSPITAL_COMMUNITY): Payer: Self-pay

## 2023-07-20 ENCOUNTER — Other Ambulatory Visit (HOSPITAL_COMMUNITY): Payer: Self-pay

## 2023-07-28 ENCOUNTER — Other Ambulatory Visit (HOSPITAL_COMMUNITY): Payer: Self-pay

## 2023-07-28 ENCOUNTER — Other Ambulatory Visit: Payer: Self-pay | Admitting: Cardiovascular Disease

## 2023-07-30 ENCOUNTER — Other Ambulatory Visit (HOSPITAL_COMMUNITY): Payer: Self-pay

## 2023-07-30 ENCOUNTER — Other Ambulatory Visit: Payer: Self-pay

## 2023-07-30 MED ORDER — PANTOPRAZOLE SODIUM 40 MG PO TBEC
40.0000 mg | DELAYED_RELEASE_TABLET | Freq: Every day | ORAL | 1 refills | Status: DC
  Filled 2023-07-30: qty 90, 90d supply, fill #0
  Filled 2023-10-22: qty 90, 90d supply, fill #1

## 2023-07-30 MED ORDER — ISOSORBIDE MONONITRATE ER 30 MG PO TB24
30.0000 mg | ORAL_TABLET | Freq: Every day | ORAL | 1 refills | Status: DC
  Filled 2023-07-30: qty 90, 90d supply, fill #0
  Filled 2023-10-28: qty 90, 90d supply, fill #1

## 2023-07-31 ENCOUNTER — Other Ambulatory Visit: Payer: Self-pay

## 2023-08-11 ENCOUNTER — Other Ambulatory Visit: Payer: Self-pay

## 2023-08-11 ENCOUNTER — Other Ambulatory Visit: Payer: Self-pay | Admitting: Cardiovascular Disease

## 2023-08-13 ENCOUNTER — Other Ambulatory Visit (HOSPITAL_COMMUNITY): Payer: Self-pay

## 2023-08-13 MED ORDER — EZETIMIBE 10 MG PO TABS
10.0000 mg | ORAL_TABLET | Freq: Every day | ORAL | 1 refills | Status: DC
  Filled 2023-08-13: qty 90, 90d supply, fill #0
  Filled 2023-11-09: qty 90, 90d supply, fill #1

## 2023-08-14 ENCOUNTER — Other Ambulatory Visit: Payer: Self-pay

## 2023-08-14 ENCOUNTER — Other Ambulatory Visit (HOSPITAL_COMMUNITY): Payer: Self-pay

## 2023-08-14 ENCOUNTER — Other Ambulatory Visit: Payer: Self-pay | Admitting: Cardiovascular Disease

## 2023-08-14 MED ORDER — LOSARTAN POTASSIUM 100 MG PO TABS
100.0000 mg | ORAL_TABLET | Freq: Every day | ORAL | 3 refills | Status: DC
  Filled 2023-08-14: qty 90, 90d supply, fill #0
  Filled 2023-11-09: qty 90, 90d supply, fill #1
  Filled 2024-02-01: qty 90, 90d supply, fill #2

## 2023-08-21 ENCOUNTER — Other Ambulatory Visit (HOSPITAL_COMMUNITY): Payer: Self-pay

## 2023-08-22 ENCOUNTER — Other Ambulatory Visit (HOSPITAL_COMMUNITY): Payer: Self-pay

## 2023-09-15 ENCOUNTER — Other Ambulatory Visit (HOSPITAL_COMMUNITY): Payer: Self-pay

## 2023-09-26 ENCOUNTER — Ambulatory Visit (INDEPENDENT_AMBULATORY_CARE_PROVIDER_SITE_OTHER): Admitting: Ophthalmology

## 2023-09-26 ENCOUNTER — Encounter (INDEPENDENT_AMBULATORY_CARE_PROVIDER_SITE_OTHER): Payer: Self-pay | Admitting: Ophthalmology

## 2023-09-26 DIAGNOSIS — H25813 Combined forms of age-related cataract, bilateral: Secondary | ICD-10-CM

## 2023-09-26 DIAGNOSIS — H35052 Retinal neovascularization, unspecified, left eye: Secondary | ICD-10-CM | POA: Diagnosis not present

## 2023-09-26 DIAGNOSIS — H43812 Vitreous degeneration, left eye: Secondary | ICD-10-CM

## 2023-09-26 DIAGNOSIS — H35033 Hypertensive retinopathy, bilateral: Secondary | ICD-10-CM

## 2023-09-26 DIAGNOSIS — H539 Unspecified visual disturbance: Secondary | ICD-10-CM

## 2023-09-26 DIAGNOSIS — I10 Essential (primary) hypertension: Secondary | ICD-10-CM | POA: Diagnosis not present

## 2023-09-26 DIAGNOSIS — Z9889 Other specified postprocedural states: Secondary | ICD-10-CM

## 2023-09-26 DIAGNOSIS — H5213 Myopia, bilateral: Secondary | ICD-10-CM

## 2023-09-26 NOTE — Progress Notes (Signed)
 [facility] - Clinic Note  09/26/2023     CHIEF COMPLAINT Patient presents for Retina Evaluation   HISTORY OF PRESENT ILLNESS: [name] is a 53 y.o. male who presents to the clinic today for:   HPI     Retina Evaluation   In left eye.  Associated Symptoms Pain.  I, the attending physician,  performed the HPI with the patient and updated documentation appropriately.        Comments   Patient here for Retina Evaluation. Patient states vision OS is blurrier. OS kind of pulses. Saw a break in line on amsler grid. OS has pain. It runs to back of head. Has history of migraines.      Last edited by Ronelle Coffee, MD on 09/28/2023  3:44 PM.    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 (Ophthalmic Drugs)   No current facility-administered medications for this visit. (Ophthalmic Drugs)   Current Outpatient Medications (Other)  Medication Sig   albuterol  (PROAIR  HFA) 108 (90 Base) MCG/ACT inhaler Inhale 2 puffs into the lungs daily as needed.   amLODipine  (NORVASC ) 5 MG tablet Take 1 tablet (5 mg total) by mouth daily. Please call our office to schedule an overdue appointment with Dr. Felipe Horton before anymore refills. [phone].   azelastine  (ASTELIN ) 0.1 % nasal spray Place 2  sprays into both nostrils 2 (two) times daily. Use in each nostril as directed   benzonatate  (TESSALON ) 100 MG capsule Take 1 capsule (100 mg total) by mouth 2 (two) times daily as needed for cough.   budesonide -formoterol  (SYMBICORT ) 160-4.5 MCG/ACT inhaler Inhale 2 puffs into the lungs 2 (two) times daily.   carvedilol  (COREG ) 6.25 MG tablet Take 1 tablet (6.25 mg total) by mouth 2 (two) times daily.   citalopram  (CELEXA ) 40 MG tablet Take 1 tablet (40 mg total) by mouth daily.   clobetasol  cream (TEMOVATE ) 0.05 % Apply 1 application  topically as needed.   Coenzyme Q10 (CO Q 10) 100 MG CAPS Take 1 capsule by mouth daily in the afternoon.   ezetimibe  (ZETIA ) 10 MG tablet Take 1 tablet (10 mg total) by mouth daily.   glucosamine-chondroitin 500-400 MG tablet Take 1 tablet by mouth daily at 6 (six) AM.   hydrocortisone  2.5 % cream Apply small amount to affected area of eyebrows twice a day.   ibuprofen  (ADVIL ) 800 MG tablet Take 800 mg by mouth as needed.   isosorbide  mononitrate (IMDUR ) 30 MG 24 hr tablet Take 1 tablet (30 mg total) by mouth daily.   losartan  (COZAAR ) 100 MG tablet Take 1 tablet (100 mg total) by mouth daily.   Multiple Vitamin (MULTIVITAMIN) tablet Take 1 tablet by mouth daily.   pantoprazole  (  PROTONIX ) 40 MG tablet Take 40 mg by mouth daily.   pantoprazole  (PROTONIX ) 40 MG tablet Take 1 tablet (40 mg total) by mouth daily.   rosuvastatin  (CRESTOR ) 20 MG tablet Take 1 tablet (20 mg total) by mouth daily.   SUMAtriptan  (IMITREX ) 25 MG tablet Take 1 tablet (25 mg total) by mouth daily as needed.   triamcinolone  cream (KENALOG ) 0.1 % Apply a small amount to skin twice a day   buPROPion  (WELLBUTRIN  XL) 300 MG 24 hr tablet Take 1 tablet (300 mg total) by mouth daily.   methylPREDNISolone  (MEDROL  DOSEPAK) 4 MG TBPK tablet Take as directed on packaging.   nitroGLYCERIN  (NITROSTAT ) 0.4 MG SL tablet Place 1 tablet (0.4 mg total) under the tongue every 5 (five) minutes as needed for  chest pain. (Patient not taking: Reported on 01/10/2023)   predniSONE  (DELTASONE ) 10 MG tablet Take 4 tabs  daily with food x 4 days, then 3 tabs daily x 4 days, then 2 tabs daily x 4 days, then 1 tab daily x4 days then stop. #40   No current facility-administered medications for this visit. (Other)   REVIEW OF SYSTEMS:   ALLERGIES Allergies  Allergen Reactions   Ansaid [Ocufen] Nausea And Vomiting   Chantix [Varenicline Tartrate] Other (See Comments)    Gi intolerances    Codeine Other (See Comments)    Gi intolerance    Erythromycin Other (See Comments)    Gi intolerances     PAST MEDICAL HISTORY Past Medical History:  Diagnosis Date   Asthma    Coronary artery disease CARDIOLOGIST - DR Kay Parson--- LAST VISIT OCT 2012   DENIES S & S   Depression    Dysuria    GERD (gastroesophageal reflux disease)    H/O hiatal hernia    History of angina pectoris    Hypertension    Kidney calculi    Left ureteral calculus    Myocardial bridge MID LAD   Shortness of breath    with excertion   Past Surgical History:  Procedure Laterality Date   CARDIAC CATHETERIZATION  01-07-2010-- DR Kay Parson   NORMAL LVF/ PATENT CORONARY ARTERIES/ MYOCARDIAL BRIDGE MID LAD   CYSTOSCOPY W/ URETERAL STENT PLACEMENT  08/15/2011   Procedure: CYSTOSCOPY WITH STENT REPLACEMENT;  Surgeon: Moise Anes, MD;  Location: [facility];  Service: Urology;  Laterality: Left;  stone basket extraction left, left ureteroscopy   EXTRACORPOREAL SHOCK WAVE LITHOTRIPSY  08-07-11   LEFT   VERICOCELECTOMY  1992    FAMILY HISTORY Family History  Problem Relation Age of Onset   Emphysema Paternal Grandfather    Aortic aneurysm Paternal Grandfather    Heart attack Brother    Cancer - Lung Father    Hypertension Mother    Hypertension Paternal Grandmother    Alzheimer's disease Maternal Grandmother    Stroke Maternal Grandfather     SOCIAL HISTORY Social History   Tobacco Use    Smoking status: Former    Current packs/day: 0.00    Average packs/day: 3.0 packs/day for 10.0 years (30.0 ttl pk-yrs)    Types: Cigarettes, Cigars    Start date: 08/14/1995    Quit date: 08/13/2005    Years since quitting: 18.1   Smokeless tobacco: Former  Building services engineer status: Never Used  Substance Use Topics   Alcohol use: No    Alcohol/week: 0.0 standard drinks of alcohol   Drug use: No       OPHTHALMIC EXAM:  Base Eye Exam     Visual Acuity (Snellen - Linear)       Right Left   Dist cc 20/20 -2 20/40 -2   Dist ph cc  NI    Correction: Glasses         Tonometry (Tonopen, 1:06 PM)       Right Left   Pressure 14 16         Pupils       Dark Light Shape React APD   Right 4 3 Round Brisk None   Left 4 3 Round Brisk None         Visual Fields (Counting fingers)       Left Right    Full Full         Extraocular Movement       Right Left    Full, Ortho Full, Ortho         Neuro/Psych     Oriented x3: Yes   Mood/Affect: Normal         Dilation     Both eyes: 1.0% Mydriacyl, 2.5% Phenylephrine @ 1:06 PM           Slit Lamp and Fundus Exam     Slit Lamp Exam       Right Left   Lids/Lashes Dermatochalasis - upper lid Dermatochalasis - upper lid   Conjunctiva/Sclera White and quiet White and quiet   Cornea Mild tear film debris, well healed lasik flap tear film debris, trace PEE, well healed lasik flap, fine endo pigment   Anterior Chamber Deep and quiet Deep and quiet   Iris Round and dilated Round and dilated   Lens 1-2+ Nuclear sclerosis, 1-2+ Cortical cataract 1-2+ Nuclear sclerosis, 1-2+ Cortical cataract   Anterior Vitreous Vitreous syneresis, mild vitreous condensations Vitreous syneresis, no pigment, Posterior vitreous detachment, Weiss ring, mild vitreous condensations         Fundus Exam       Right Left   Disc mild Pallor, tilted, temporal PPA mild Pallor, tilted, temporal PPA   C/D Ratio 0.4 0.3   Macula  Flat, Blunted foveal reflex, RPE mottling and clumping, No heme or edema Flat, Blunted foveal reflex, RPE mottling and clumping, No edema, punctate IRH   Vessels attenuated, mild tortuousity attenuated, mild tortuousity   Periphery Attached, pigmented lattice from 1100-0100 with good laser surrounding; peripheral laser IT periphery -- almost to ora, mild paving stone degeneration inferiorly, no new RT/RD Attached, pigmented lattice superiorly from 1100-0130 w/ good laser surrounding, pigmented lattice from 0300-0600 with good laser surrounding, mild paving stone superior periphery; no new RT/RD           Refraction     Wearing Rx       Sphere Cylinder Axis Add   Right -3.75 +0.75 179 +2.50   Left -4.25 +1.00 005 +2.50            IMAGING AND PROCEDURES  Imaging and Procedures for 09/26/2023  OCT, Retina - OU - Both Eyes       Right Eye Quality was good. Central Foveal Thickness: 270. Progression has been stable. Findings include normal foveal contour, no IRF, no SRF, myopic contour, vitreomacular adhesion .   Left Eye Quality was good. Central Foveal Thickness: 283. Progression has been stable. Findings include normal foveal contour, no IRF, no SRF, myopic contour.   Notes *Images captured and stored on drive  Diagnosis / Impression:  NFP; no IRF/SRF OU Myopic contour OU  Clinical  management:  See below  Abbreviations: NFP - Normal foveal profile. CME - cystoid macular edema. PED - pigment epithelial detachment. IRF - intraretinal fluid. SRF - subretinal fluid. EZ - ellipsoid zone. ERM - epiretinal membrane. ORA - outer retinal atrophy. ORT - outer retinal tubulation. SRHM - subretinal hyper-reflective material. IRHM - intraretinal hyper-reflective material            ASSESSMENT/PLAN:    ICD-10-CM   1. Visual disturbance  H53.9     2. Posterior vitreous detachment of left eye  H43.812 OCT, Retina - OU - Both Eyes    3. CNVM (choroidal neovascular membrane),  left  H35.052 OCT, Retina - OU - Both Eyes    4. Essential hypertension  I10     5. Hypertensive retinopathy of both eyes  H35.033 OCT, Retina - OU - Both Eyes    6. Combined forms of age-related cataract of both eyes  H25.813     7. Severe myopia of both eyes  H52.13     8. Hx of LASIK  Z98.890      **pt presents acutely today for blurry vision OS / headaches**  1. Visual disturbances  - pt reports blurred vision and Amsler grid changes associated with left sided headaches  - OCT and exam stable OU  - BCVA OD 20/20; OS 20/40 -- stable  - no exam or imaging findings to explain visual symptoms -- suspect headache related vision changes  - no retinal or ophthalmic interventions indicated or recommended   - ok to f/u with JDM as scheduled, sooner prn   1,2. PVD / vitreous syneresis OS  - symptomatic floaters / flashes OS x2 days  - exam with prominent floaters / vit condensations, but no RT or RD on 360 scleral depressed exam OS  - OCT shows interval release of VMA from prior scans  - Discussed findings and prognosis  - Reviewed s/s of RT/RD  - Strict return precautions for any such RT/RD signs/symptoms  - f/u as scheduled with Dr. Augustus Knox, sooner prn  3. H/o CNV OS  - under the expert management of JDM  - last IVA OS (01.17.25)  4,5. Hypertensive retinopathy OU - discussed importance of tight BP control - monitor  6. Mixed Cataract OU - The symptoms of cataract, surgical options, and treatments and risks were discussed with patient. - discussed diagnosis and progression - not yet visually significant - monitor for now  7. Myopic degeneration OU  8. Hx of lasik OU   Ophthalmic Meds Ordered this visit:  No orders of the defined types were placed in this encounter.     Return if symptoms worsen or fail to improve.  There are no Patient Instructions on file for this visit.   Explained the diagnoses, plan, and follow up with the patient and they expressed  understanding.  Patient expressed understanding of the importance of proper follow up care.   This document serves as a record of services personally performed by Jeanice Millard, MD, PhD. It was created on their behalf by Morley Arabia. Bevin Bucks, OA an ophthalmic technician. The creation of this record is the provider's dictation and/or activities during the visit.    Electronically signed by: Morley Arabia. Bevin Bucks, OA 09/28/23 3:56 PM  Jeanice Millard, M.D., Ph.D. Diseases & Surgery of the Retina and Vitreous [facility]  I have reviewed the above documentation for accuracy and completeness, and I agree with the above. Jeanice Millard, M.D.,  Ph.D. 09/28/23 4:10 PM   Abbreviations: M myopia (nearsighted); A astigmatism; H hyperopia (farsighted); P presbyopia; Mrx spectacle prescription;  CTL contact lenses; OD right eye; OS left eye; OU both eyes  XT exotropia; ET esotropia; PEK punctate epithelial keratitis; PEE punctate epithelial erosions; DES dry eye syndrome; MGD meibomian gland dysfunction; ATs artificial tears; PFAT's preservative free artificial tears; NSC nuclear sclerotic cataract; PSC posterior subcapsular cataract; ERM epi-retinal membrane; PVD posterior vitreous detachment; RD retinal detachment; DM diabetes mellitus; DR diabetic retinopathy; NPDR non-proliferative diabetic retinopathy; PDR proliferative diabetic retinopathy; CSME clinically significant macular edema; DME diabetic macular edema; dbh dot blot hemorrhages; CWS cotton wool spot; POAG primary open angle glaucoma; C/D cup-to-disc ratio; HVF humphrey visual field; GVF goldmann visual field; OCT optical coherence tomography; IOP intraocular pressure; BRVO Branch retinal vein occlusion; CRVO central retinal vein occlusion; CRAO central retinal artery occlusion; BRAO branch retinal artery occlusion; RT retinal tear; SB scleral buckle; PPV pars plana vitrectomy; VH Vitreous hemorrhage; PRP panretinal laser photocoagulation;  IVK intravitreal kenalog ; VMT vitreomacular traction; MH Macular hole;  NVD neovascularization of the disc; NVE neovascularization elsewhere; AREDS age related eye disease study; ARMD age related macular degeneration; POAG primary open angle glaucoma; EBMD epithelial/anterior basement membrane dystrophy; ACIOL anterior chamber intraocular lens; IOL intraocular lens; PCIOL posterior chamber intraocular lens; Phaco/IOL phacoemulsification with intraocular lens placement; PRK photorefractive keratectomy; LASIK laser assisted in situ keratomileusis; HTN hypertension; DM diabetes mellitus; COPD chronic obstructive pulmonary disease

## 2023-09-27 ENCOUNTER — Encounter (INDEPENDENT_AMBULATORY_CARE_PROVIDER_SITE_OTHER): Admitting: Ophthalmology

## 2023-09-28 ENCOUNTER — Encounter (INDEPENDENT_AMBULATORY_CARE_PROVIDER_SITE_OTHER): Payer: Self-pay | Admitting: Ophthalmology

## 2023-10-08 ENCOUNTER — Other Ambulatory Visit: Payer: Self-pay | Admitting: Emergency Medicine

## 2023-10-08 ENCOUNTER — Encounter (HOSPITAL_BASED_OUTPATIENT_CLINIC_OR_DEPARTMENT_OTHER): Payer: Self-pay | Admitting: Pulmonary Disease

## 2023-10-08 ENCOUNTER — Encounter: Attending: Gastroenterology

## 2023-10-08 DIAGNOSIS — R861 Abnormal level of hormones in specimens from male genital organs: Secondary | ICD-10-CM | POA: Diagnosis not present

## 2023-10-08 LAB — PSA: PSA: 3.11 ng/mL (ref <=4.00)

## 2023-10-08 LAB — T3, FREE: T3, Free: 5.4 pg/mL — ABNORMAL HIGH (ref 2.3–4.2)

## 2023-10-08 LAB — ESTRADIOL: Estradiol: 61 pg/mL — ABNORMAL HIGH (ref <=39)

## 2023-10-08 LAB — DHEA-SULFATE: DHEA-SO4: 51 ug/dL (ref 32–279)

## 2023-10-08 LAB — TESTOSTERONE: Testosterone: 1368 ng/dL — ABNORMAL HIGH (ref 250–827)

## 2023-10-08 MED ORDER — PREDNISONE 10 MG PO TABS
ORAL_TABLET | ORAL | 0 refills | Status: AC
  Filled 2023-10-08: qty 40, 16d supply, fill #0

## 2023-10-08 NOTE — Telephone Encounter (Signed)
 Prednisone  taper sent to pharmacy. He can make a routine appointment next available with me or with APP to discuss why he is having frequent flares

## 2023-10-08 NOTE — Telephone Encounter (Signed)
**Note De-identified  [place]oolbright Obfuscation** Please advise 

## 2023-10-09 ENCOUNTER — Other Ambulatory Visit: Payer: Self-pay

## 2023-10-09 ENCOUNTER — Other Ambulatory Visit (HOSPITAL_COMMUNITY): Payer: Self-pay

## 2023-10-13 ENCOUNTER — Other Ambulatory Visit (HOSPITAL_BASED_OUTPATIENT_CLINIC_OR_DEPARTMENT_OTHER): Payer: Self-pay | Admitting: Pulmonary Disease

## 2023-10-13 ENCOUNTER — Other Ambulatory Visit: Payer: Self-pay | Admitting: Cardiovascular Disease

## 2023-10-15 ENCOUNTER — Other Ambulatory Visit (HOSPITAL_COMMUNITY): Payer: Self-pay

## 2023-10-15 ENCOUNTER — Other Ambulatory Visit (HOSPITAL_BASED_OUTPATIENT_CLINIC_OR_DEPARTMENT_OTHER): Payer: Self-pay

## 2023-10-15 ENCOUNTER — Other Ambulatory Visit: Payer: Self-pay

## 2023-10-15 MED ORDER — BUDESONIDE-FORMOTEROL FUMARATE 160-4.5 MCG/ACT IN AERO
2.0000 | INHALATION_SPRAY | Freq: Two times a day (BID) | RESPIRATORY_TRACT | 3 refills | Status: DC
  Filled 2023-10-15: qty 10.2, 30d supply, fill #0

## 2023-10-16 ENCOUNTER — Other Ambulatory Visit (HOSPITAL_COMMUNITY): Payer: Self-pay

## 2023-10-16 ENCOUNTER — Other Ambulatory Visit: Payer: Self-pay

## 2023-10-16 MED ORDER — CARVEDILOL 6.25 MG PO TABS
6.2500 mg | ORAL_TABLET | Freq: Two times a day (BID) | ORAL | 0 refills | Status: DC
  Filled 2023-10-16: qty 180, 90d supply, fill #0

## 2023-10-22 ENCOUNTER — Other Ambulatory Visit (HOSPITAL_COMMUNITY): Payer: Self-pay

## 2023-10-22 ENCOUNTER — Other Ambulatory Visit: Payer: Self-pay | Admitting: Cardiovascular Disease

## 2023-10-22 ENCOUNTER — Other Ambulatory Visit: Payer: Self-pay

## 2023-10-22 MED ORDER — AMLODIPINE BESYLATE 5 MG PO TABS
5.0000 mg | ORAL_TABLET | Freq: Every day | ORAL | 0 refills | Status: DC
  Filled 2023-10-22: qty 30, 30d supply, fill #0

## 2023-10-23 ENCOUNTER — Other Ambulatory Visit (HOSPITAL_COMMUNITY): Payer: Self-pay

## 2023-10-23 ENCOUNTER — Other Ambulatory Visit: Payer: Self-pay

## 2023-10-23 MED ORDER — CITALOPRAM HYDROBROMIDE 40 MG PO TABS
40.0000 mg | ORAL_TABLET | Freq: Every day | ORAL | 1 refills | Status: DC
  Filled 2023-10-23: qty 90, 90d supply, fill #0
  Filled 2024-01-22: qty 90, 90d supply, fill #1

## 2023-10-29 ENCOUNTER — Encounter (HOSPITAL_BASED_OUTPATIENT_CLINIC_OR_DEPARTMENT_OTHER): Payer: Self-pay | Admitting: Pulmonary Disease

## 2023-10-29 ENCOUNTER — Other Ambulatory Visit (HOSPITAL_BASED_OUTPATIENT_CLINIC_OR_DEPARTMENT_OTHER): Payer: Self-pay

## 2023-10-29 ENCOUNTER — Other Ambulatory Visit (HOSPITAL_COMMUNITY): Payer: Self-pay

## 2023-10-29 ENCOUNTER — Ambulatory Visit (HOSPITAL_BASED_OUTPATIENT_CLINIC_OR_DEPARTMENT_OTHER): Admitting: Pulmonary Disease

## 2023-10-29 VITALS — BP 136/79 | HR 102 | Ht 69.0 in | Wt 235.2 lb

## 2023-10-29 DIAGNOSIS — Z87891 Personal history of nicotine dependence: Secondary | ICD-10-CM

## 2023-10-29 DIAGNOSIS — K219 Gastro-esophageal reflux disease without esophagitis: Secondary | ICD-10-CM | POA: Diagnosis not present

## 2023-10-29 DIAGNOSIS — J4541 Moderate persistent asthma with (acute) exacerbation: Secondary | ICD-10-CM | POA: Diagnosis not present

## 2023-10-29 DIAGNOSIS — G4733 Obstructive sleep apnea (adult) (pediatric): Secondary | ICD-10-CM

## 2023-10-29 MED ORDER — FLUTICASONE-SALMETEROL 230-21 MCG/ACT IN AERO
2.0000 | INHALATION_SPRAY | Freq: Two times a day (BID) | RESPIRATORY_TRACT | 12 refills | Status: AC
  Filled 2023-10-29: qty 12, 30d supply, fill #0
  Filled 2023-11-23: qty 12, 30d supply, fill #1
  Filled 2023-12-23: qty 12, 30d supply, fill #2
  Filled 2024-01-22: qty 12, 30d supply, fill #3
  Filled 2024-02-22: qty 12, 30d supply, fill #4
  Filled 2024-03-29: qty 12, 30d supply, fill #5
  Filled 2024-05-03: qty 12, 30d supply, fill #6
  Filled 2024-05-31: qty 12, 30d supply, fill #7
  Filled 2024-07-09: qty 12, 30d supply, fill #0

## 2023-10-29 NOTE — Telephone Encounter (Signed)
**Note De-identified  [place]oolbright Obfuscation** Please advise 

## 2023-10-29 NOTE — Patient Instructions (Signed)
 VISIT SUMMARY:  Today, you were seen for worsening respiratory symptoms related to your exercise-induced asthma. We discussed your recent history, including the need for prednisone  and changes in your inhaler medication. We also reviewed your symptoms, including shortness of breath, chest tightness, and coughing, especially during exercise. Additionally, we touched on your obstructive sleep apnea and gastroesophageal reflux disease (GERD).  YOUR PLAN:  -ASTHMA, UNSPECIFIED: Your asthma symptoms have worsened, causing increased shortness of breath, chest tightness, and coughing, especially during exercise. We plan to increase your inhaled corticosteroid therapy to either Advair  500 or Breo 200 for better maintenance. We also discussed the possibility of using Airsupra, which combines albuterol  with a steroid, for as-needed use. A prescription for prednisone  will be kept available for potential future exacerbations. We will order spirometry to assess your lung function and consider an allergy panel and eosinophil count if your symptoms persist or worsen.

## 2023-10-29 NOTE — Progress Notes (Signed)
 Subjective:    Patient ID: [name], male    DOB: 06/19/1970, 53 y.o.   MRN: [id number]  HPI  53 yo man for FU of  moderate OSA & exercise induced Asthma    PMH : CAD ,myocardial bridge    Former smoker- max 3 PPD , quit 2007  4 month FU visit  He experienced well-controlled exercise-induced asthma until a respiratory illness in January, after which he required prednisone  and was started on Pulmicort . He was later switched to a higher dose of Symbicort . Recently, his symptoms have worsened, requiring another course of prednisone . Post-tapering, he experiences shortness of breath and chest tightness, especially during exercise, leading to coughing episodes.  Oxygen saturation levels range from 87% to 96%. He experiences consistent coughing and becomes winded while talking or showering. He notes chest tightness with occasional wheezing and crackles in the upper airway.  He currently takes Symbicort  twice daily and uses albuterol  approximately twice a day during severe coughing spells. He also takes Claritin for allergies. He reports an intermittent rash around his eyes and has a history of allergies to grass and trees, which have worsened this year. There are no changes in his environment, and he uses a CPAP machine for obstructive sleep apnea.  Significant tests/ events   Home sleep study 05/2014 showed moderate obstructive sleep apnea, events were about 14 per hour.   AutoCPAP download 07/2014 >> good usage >7h, no residuals, avg 8 cm   10/2014 CPAP 8 cm>> no residulas or leak, good usage PFTs 08/2020 nml     PFTs 12/2015 nml  Review of Systems neg for any significant sore throat, dysphagia, itching, sneezing, nasal congestion or excess/ purulent secretions, fever, chills, sweats, unintended wt loss, pleuritic or exertional cp, hempoptysis, orthopnea pnd or change in chronic leg swelling. Also denies presyncope, palpitations, heartburn, abdominal pain, nausea, vomiting,  diarrhea or change in bowel or urinary habits, dysuria,hematuria, rash, arthralgias, visual complaints, headache, numbness weakness or ataxia.     Objective:   Physical Exam  Gen. Pleasant, obese, in no distress ENT - no lesions, no post nasal drip Neck: No JVD, no thyromegaly, no carotid bruits Lungs: no use of accessory muscles, no dullness to percussion, decreased without rales or rhonchi  Cardiovascular: Rhythm regular, heart sounds  normal, no murmurs or gallops, no peripheral edema Musculoskeletal: No deformities, no cyanosis or clubbing , no tremors        Assessment & Plan:  Asthma, Mod persistent with exacerbation Asthma exacerbation with increased symptoms of dyspnea, chest tightness, and cough, particularly during exertion. Symptoms have worsened since January, necessitating increased use of albuterol  and prednisone . Current maintenance therapy with Symbicort  is at maximum dose. Differential diagnosis includes potential allergy-related exacerbation due to a severe allergy season. No significant wheezing on examination, and lungs are clear. Spirometry is planned to assess pulmonary function. Discussed two treatment strategies: increasing inhaled corticosteroid therapy or adding Airsupra, a combination of albuterol  and steroid, for as-needed use. The goal is to minimize oral steroid use while effectively managing symptoms. - Increase inhaled corticosteroid therapy to either Advair  500 or Breo 200 for higher dose maintenance. - Consider Airsupra as an alternative to albuterol  if available, which combines albuterol  with a steroid for as-needed use. - Keep a prescription for prednisone  available for potential future exacerbations. - Order spirometry to assess pulmonary function. - Consider allergy panel and eosinophil count if symptoms persist or worsen.  Obstructive sleep apnea Obstructive sleep apnea is well-controlled with CPAP therapy.  AHI remains low at 1.2, indicating effective  management.  Gastroesophageal reflux disease without esophagitis GERD is well-controlled with Protonix , though occasional flare-ups occur. No consistent pattern of symptoms reported.

## 2023-10-29 NOTE — Telephone Encounter (Signed)
 Dr Villa Greaser wants to see him today?

## 2023-10-30 ENCOUNTER — Other Ambulatory Visit (HOSPITAL_COMMUNITY)
Admission: RE | Admit: 2023-10-30 | Discharge: 2023-10-30 | Disposition: A | Discharge status: Home / Self Care | Source: Ambulatory Visit | Attending: Cardiovascular Disease | Admitting: Cardiovascular Disease

## 2023-10-30 ENCOUNTER — Other Ambulatory Visit: Payer: Self-pay | Admitting: Emergency Medicine

## 2023-10-30 ENCOUNTER — Ambulatory Visit: Payer: Self-pay | Admitting: Cardiovascular Disease

## 2023-10-30 DIAGNOSIS — E782 Mixed hyperlipidemia: Secondary | ICD-10-CM

## 2023-10-30 DIAGNOSIS — I10 Essential (primary) hypertension: Secondary | ICD-10-CM | POA: Insufficient documentation

## 2023-10-30 DIAGNOSIS — I25118 Atherosclerotic heart disease of native coronary artery with other forms of angina pectoris: Secondary | ICD-10-CM | POA: Diagnosis not present

## 2023-10-30 LAB — LIPID PANEL
Cholesterol: 124 mg/dL (ref 0–200)
HDL: 40 mg/dL — ABNORMAL LOW (ref >40)
LDL Cholesterol: 65 mg/dL (ref 0–99)
Total CHOL/HDL Ratio: 3.1 ratio
Triglycerides: 94 mg/dL (ref <150)
VLDL: 19 mg/dL (ref 0–40)

## 2023-10-30 LAB — BASIC METABOLIC PANEL WITH GFR
Anion gap: 7 (ref 5–15)
BUN: 16 mg/dL (ref 6–20)
CO2: 26 mmol/L (ref 22–32)
Calcium: 9.3 mg/dL (ref 8.9–10.3)
Chloride: 100 mmol/L (ref 98–111)
Creatinine, Ser: 1.07 mg/dL (ref 0.61–1.24)
GFR, Estimated: >60 mL/min (ref >60)
Glucose, Bld: 122 mg/dL — ABNORMAL HIGH (ref 70–99)
Potassium: 3.8 mmol/L (ref 3.5–5.1)
Sodium: 133 mmol/L — ABNORMAL LOW (ref 135–145)

## 2023-10-31 ENCOUNTER — Other Ambulatory Visit (HOSPITAL_COMMUNITY): Payer: Self-pay

## 2023-10-31 ENCOUNTER — Other Ambulatory Visit: Payer: Self-pay

## 2023-10-31 ENCOUNTER — Other Ambulatory Visit: Payer: Self-pay | Admitting: Pulmonary Disease

## 2023-10-31 ENCOUNTER — Encounter (HOSPITAL_COMMUNITY): Payer: Self-pay

## 2023-10-31 ENCOUNTER — Other Ambulatory Visit (HOSPITAL_BASED_OUTPATIENT_CLINIC_OR_DEPARTMENT_OTHER): Payer: Self-pay

## 2023-10-31 MED ORDER — AIRSUPRA 90-80 MCG/ACT IN AERO
2.0000 | INHALATION_SPRAY | Freq: Four times a day (QID) | RESPIRATORY_TRACT | 2 refills | Status: AC | PRN
  Filled 2023-10-31: qty 10.7, 30d supply, fill #0
  Filled 2023-10-31 (×2): qty 10.7, 15d supply, fill #0
  Filled 2023-10-31: qty 10.7, 30d supply, fill #0
  Filled 2024-03-02: qty 10.7, 15d supply, fill #1

## 2023-11-07 ENCOUNTER — Encounter (INDEPENDENT_AMBULATORY_CARE_PROVIDER_SITE_OTHER): Admitting: Ophthalmology

## 2023-11-07 DIAGNOSIS — H35033 Hypertensive retinopathy, bilateral: Secondary | ICD-10-CM | POA: Diagnosis not present

## 2023-11-07 DIAGNOSIS — H43813 Vitreous degeneration, bilateral: Secondary | ICD-10-CM

## 2023-11-07 DIAGNOSIS — H33303 Unspecified retinal break, bilateral: Secondary | ICD-10-CM | POA: Diagnosis not present

## 2023-11-07 DIAGNOSIS — I10 Essential (primary) hypertension: Secondary | ICD-10-CM

## 2023-11-07 DIAGNOSIS — H442A2 Degenerative myopia with choroidal neovascularization, left eye: Secondary | ICD-10-CM

## 2023-11-07 DIAGNOSIS — H4421 Degenerative myopia, right eye: Secondary | ICD-10-CM

## 2023-11-08 DIAGNOSIS — Z6834 Body mass index (BMI) 34.0-34.9, adult: Secondary | ICD-10-CM | POA: Diagnosis not present

## 2023-11-08 DIAGNOSIS — G43009 Migraine without aura, not intractable, without status migrainosus: Secondary | ICD-10-CM | POA: Diagnosis not present

## 2023-11-09 ENCOUNTER — Other Ambulatory Visit (HOSPITAL_BASED_OUTPATIENT_CLINIC_OR_DEPARTMENT_OTHER): Payer: Self-pay | Admitting: Family Medicine

## 2023-11-09 ENCOUNTER — Other Ambulatory Visit (HOSPITAL_COMMUNITY): Payer: Self-pay

## 2023-11-09 DIAGNOSIS — G43009 Migraine without aura, not intractable, without status migrainosus: Secondary | ICD-10-CM

## 2023-11-12 ENCOUNTER — Other Ambulatory Visit (HOSPITAL_COMMUNITY): Payer: Self-pay | Admitting: Family Medicine

## 2023-11-12 DIAGNOSIS — G43009 Migraine without aura, not intractable, without status migrainosus: Secondary | ICD-10-CM

## 2023-11-13 ENCOUNTER — Encounter (HOSPITAL_COMMUNITY): Payer: Self-pay

## 2023-11-13 ENCOUNTER — Ambulatory Visit (HOSPITAL_COMMUNITY)
Admission: RE | Admit: 2023-11-13 | Discharge: 2023-11-13 | Disposition: A | Discharge status: Home / Self Care | Source: Ambulatory Visit | Attending: Family Medicine | Admitting: Family Medicine

## 2023-11-13 DIAGNOSIS — G43009 Migraine without aura, not intractable, without status migrainosus: Secondary | ICD-10-CM | POA: Diagnosis not present

## 2023-11-13 DIAGNOSIS — G43909 Migraine, unspecified, not intractable, without status migrainosus: Secondary | ICD-10-CM | POA: Diagnosis not present

## 2023-11-13 MED ORDER — IOHEXOL 300 MG/ML  SOLN
75.0000 mL | Freq: Once | INTRAMUSCULAR | Status: AC | PRN
  Administered 2023-11-13: 75 mL via INTRAVENOUS

## 2023-11-19 ENCOUNTER — Ambulatory Visit (HOSPITAL_BASED_OUTPATIENT_CLINIC_OR_DEPARTMENT_OTHER): Admitting: Nurse Practitioner

## 2023-11-19 ENCOUNTER — Other Ambulatory Visit: Payer: Self-pay | Admitting: Cardiovascular Disease

## 2023-11-20 ENCOUNTER — Other Ambulatory Visit: Payer: Self-pay

## 2023-11-20 ENCOUNTER — Other Ambulatory Visit (HOSPITAL_COMMUNITY): Payer: Self-pay

## 2023-11-20 MED ORDER — ROSUVASTATIN CALCIUM 20 MG PO TABS
20.0000 mg | ORAL_TABLET | Freq: Every day | ORAL | 1 refills | Status: DC
  Filled 2023-11-20: qty 90, 90d supply, fill #0

## 2023-11-23 ENCOUNTER — Encounter (INDEPENDENT_AMBULATORY_CARE_PROVIDER_SITE_OTHER): Payer: 59 | Admitting: Ophthalmology

## 2023-11-23 ENCOUNTER — Other Ambulatory Visit (HOSPITAL_COMMUNITY): Payer: Self-pay

## 2023-12-06 ENCOUNTER — Other Ambulatory Visit (HOSPITAL_COMMUNITY): Payer: Self-pay

## 2023-12-06 DIAGNOSIS — R3911 Hesitancy of micturition: Secondary | ICD-10-CM | POA: Diagnosis not present

## 2023-12-06 DIAGNOSIS — R3916 Straining to void: Secondary | ICD-10-CM | POA: Diagnosis not present

## 2023-12-06 DIAGNOSIS — N401 Enlarged prostate with lower urinary tract symptoms: Secondary | ICD-10-CM | POA: Diagnosis not present

## 2023-12-06 DIAGNOSIS — R3915 Urgency of urination: Secondary | ICD-10-CM | POA: Diagnosis not present

## 2023-12-06 DIAGNOSIS — R351 Nocturia: Secondary | ICD-10-CM | POA: Diagnosis not present

## 2023-12-06 MED ORDER — TAMSULOSIN HCL 0.4 MG PO CAPS
0.4000 mg | ORAL_CAPSULE | Freq: Every day | ORAL | 11 refills | Status: AC
  Filled 2023-12-06: qty 30, 30d supply, fill #0
  Filled 2024-01-03: qty 30, 30d supply, fill #1
  Filled 2024-02-01: qty 30, 30d supply, fill #2
  Filled 2024-03-02: qty 90, 90d supply, fill #3
  Filled 2024-05-30: qty 30, 30d supply, fill #4
  Filled 2024-06-27: qty 90, 90d supply, fill #5

## 2023-12-24 ENCOUNTER — Other Ambulatory Visit: Payer: Self-pay

## 2024-01-03 ENCOUNTER — Other Ambulatory Visit (HOSPITAL_COMMUNITY): Payer: Self-pay

## 2024-01-03 DIAGNOSIS — Z125 Encounter for screening for malignant neoplasm of prostate: Secondary | ICD-10-CM | POA: Diagnosis not present

## 2024-01-03 DIAGNOSIS — N401 Enlarged prostate with lower urinary tract symptoms: Secondary | ICD-10-CM | POA: Diagnosis not present

## 2024-01-03 DIAGNOSIS — R351 Nocturia: Secondary | ICD-10-CM | POA: Diagnosis not present

## 2024-01-03 DIAGNOSIS — R3916 Straining to void: Secondary | ICD-10-CM | POA: Diagnosis not present

## 2024-01-09 ENCOUNTER — Other Ambulatory Visit: Payer: Self-pay | Admitting: Cardiovascular Disease

## 2024-01-10 ENCOUNTER — Ambulatory Visit: Admitting: Pulmonary Disease

## 2024-01-10 ENCOUNTER — Ambulatory Visit: Payer: Self-pay | Admitting: Pulmonary Disease

## 2024-01-10 DIAGNOSIS — J4541 Moderate persistent asthma with (acute) exacerbation: Secondary | ICD-10-CM | POA: Diagnosis not present

## 2024-01-10 LAB — PULMONARY FUNCTION TEST
FEF 25-75 Post: 2.07 L/s
FEF 25-75 Pre: 2.35 L/s
FEF2575-%Change-Post: -12 %
FEF2575-%Pred-Post: 63 %
FEF2575-%Pred-Pre: 71 %
FEV1-%Change-Post: -3 %
FEV1-%Pred-Post: 81 %
FEV1-%Pred-Pre: 84 %
FEV1-Post: 3.06 L
FEV1-Pre: 3.17 L
FEV1FVC-%Change-Post: 0 %
FEV1FVC-%Pred-Pre: 94 %
FEV6-%Change-Post: -1 %
FEV6-%Pred-Post: 90 %
FEV6-%Pred-Pre: 91 %
FEV6-Post: 4.2 L
FEV6-Pre: 4.27 L
FEV6FVC-%Change-Post: 0 %
FEV6FVC-%Pred-Post: 103 %
FEV6FVC-%Pred-Pre: 102 %
FVC-%Change-Post: -2 %
FVC-%Pred-Post: 86 %
FVC-%Pred-Pre: 89 %
FVC-Post: 4.2 L
FVC-Pre: 4.31 L
Post FEV1/FVC ratio: 73 %
Post FEV6/FVC ratio: 100 %
Pre FEV1/FVC ratio: 73 %
Pre FEV6/FVC Ratio: 99 %

## 2024-01-10 NOTE — Patient Instructions (Signed)
 Pre/post spiro performed today

## 2024-01-10 NOTE — Progress Notes (Signed)
 Pre/post spiro performed today

## 2024-01-11 ENCOUNTER — Encounter (HOSPITAL_COMMUNITY): Payer: Self-pay

## 2024-01-11 ENCOUNTER — Other Ambulatory Visit (HOSPITAL_COMMUNITY): Payer: Self-pay

## 2024-01-11 ENCOUNTER — Other Ambulatory Visit: Payer: Self-pay

## 2024-01-11 MED ORDER — CARVEDILOL 6.25 MG PO TABS
6.2500 mg | ORAL_TABLET | Freq: Two times a day (BID) | ORAL | 0 refills | Status: DC
  Filled 2024-01-11: qty 60, 30d supply, fill #0

## 2024-01-11 MED ORDER — AMLODIPINE BESYLATE 5 MG PO TABS
5.0000 mg | ORAL_TABLET | Freq: Every day | ORAL | 0 refills | Status: DC
  Filled 2024-01-11: qty 30, 30d supply, fill #0

## 2024-01-22 ENCOUNTER — Other Ambulatory Visit: Payer: Self-pay | Admitting: Cardiovascular Disease

## 2024-01-22 ENCOUNTER — Other Ambulatory Visit (HOSPITAL_COMMUNITY): Payer: Self-pay

## 2024-01-22 ENCOUNTER — Other Ambulatory Visit: Payer: Self-pay

## 2024-01-22 MED ORDER — PANTOPRAZOLE SODIUM 40 MG PO TBEC
40.0000 mg | DELAYED_RELEASE_TABLET | Freq: Every day | ORAL | 0 refills | Status: DC
  Filled 2024-01-22: qty 90, 90d supply, fill #0

## 2024-01-24 ENCOUNTER — Other Ambulatory Visit: Payer: Self-pay

## 2024-01-24 MED ORDER — ISOSORBIDE MONONITRATE ER 30 MG PO TB24
30.0000 mg | ORAL_TABLET | Freq: Every day | ORAL | 1 refills | Status: DC
  Filled 2024-01-24: qty 30, 30d supply, fill #0

## 2024-02-01 ENCOUNTER — Other Ambulatory Visit (HOSPITAL_COMMUNITY): Payer: Self-pay

## 2024-02-13 ENCOUNTER — Other Ambulatory Visit (HOSPITAL_COMMUNITY): Payer: Self-pay

## 2024-02-13 ENCOUNTER — Encounter: Payer: Self-pay | Admitting: Cardiovascular Disease

## 2024-02-13 ENCOUNTER — Ambulatory Visit: Attending: Cardiovascular Disease | Admitting: Cardiovascular Disease

## 2024-02-13 ENCOUNTER — Other Ambulatory Visit: Payer: Self-pay

## 2024-02-13 VITALS — BP 122/78 | HR 73 | Ht 69.0 in | Wt 229.3 lb

## 2024-02-13 DIAGNOSIS — G4733 Obstructive sleep apnea (adult) (pediatric): Secondary | ICD-10-CM | POA: Diagnosis not present

## 2024-02-13 DIAGNOSIS — I10 Essential (primary) hypertension: Secondary | ICD-10-CM | POA: Diagnosis not present

## 2024-02-13 DIAGNOSIS — I25118 Atherosclerotic heart disease of native coronary artery with other forms of angina pectoris: Secondary | ICD-10-CM

## 2024-02-13 DIAGNOSIS — E785 Hyperlipidemia, unspecified: Secondary | ICD-10-CM | POA: Diagnosis not present

## 2024-02-13 DIAGNOSIS — J4540 Moderate persistent asthma, uncomplicated: Secondary | ICD-10-CM

## 2024-02-13 MED ORDER — LOSARTAN POTASSIUM 100 MG PO TABS
100.0000 mg | ORAL_TABLET | Freq: Every day | ORAL | 3 refills | Status: AC
  Filled 2024-02-13 – 2024-05-03 (×2): qty 90, 90d supply, fill #0

## 2024-02-13 MED ORDER — ISOSORBIDE MONONITRATE ER 30 MG PO TB24
30.0000 mg | ORAL_TABLET | Freq: Every day | ORAL | 3 refills | Status: AC
  Filled 2024-02-13 – 2024-02-18 (×3): qty 90, 90d supply, fill #0
  Filled 2024-05-30: qty 90, 90d supply, fill #1

## 2024-02-13 MED ORDER — EZETIMIBE 10 MG PO TABS
10.0000 mg | ORAL_TABLET | Freq: Every day | ORAL | 3 refills | Status: AC
  Filled 2024-02-13: qty 90, 90d supply, fill #0
  Filled 2024-05-10: qty 90, 90d supply, fill #1

## 2024-02-13 MED ORDER — CARVEDILOL 6.25 MG PO TABS
6.2500 mg | ORAL_TABLET | Freq: Two times a day (BID) | ORAL | 3 refills | Status: AC
  Filled 2024-02-13: qty 180, 90d supply, fill #0
  Filled 2024-05-10: qty 180, 90d supply, fill #1

## 2024-02-13 MED ORDER — AMLODIPINE BESYLATE 5 MG PO TABS
5.0000 mg | ORAL_TABLET | Freq: Every day | ORAL | 3 refills | Status: AC
  Filled 2024-02-13: qty 90, 90d supply, fill #0
  Filled 2024-05-10: qty 90, 90d supply, fill #1

## 2024-02-13 MED ORDER — ROSUVASTATIN CALCIUM 20 MG PO TABS
20.0000 mg | ORAL_TABLET | Freq: Every day | ORAL | 3 refills | Status: AC
  Filled 2024-02-13: qty 90, 90d supply, fill #0
  Filled 2024-05-14: qty 90, 90d supply, fill #1

## 2024-02-13 NOTE — Patient Instructions (Signed)
 Medication Instructions:  No changes *If you need a refill on your cardiac medications before your next appointment, please call your pharmacy*  Lab Work: None orderd If you have labs (blood work) drawn today and your tests are completely normal, you will receive your results only by: MyChart Message (if you have MyChart) OR A paper copy in the mail If you have any lab test that is abnormal or we need to change your treatment, we will call you to review the results.  Testing/Procedures: None ordered  Follow-Up: At [facility], you and your health needs are our priority.  As part of our continuing mission to provide you with exceptional heart care, our providers are all part of one team.  This team includes your primary Cardiologist (physician) and Advanced Practice Providers or APPs (Physician Assistants and Nurse Practitioners) who all work together to provide you with the care you need, when you need it.  Your next appointment:   1 year(s)  Provider:   Dr Francyne  We recommend signing up for the patient portal called MyChart.  Sign up information is provided on this After Visit Summary.  MyChart is used to connect with patients for Virtual Visits (Telemedicine).  Patients are able to view lab/test results, encounter notes, upcoming appointments, etc.  Non-urgent messages can be sent to your provider as well.   To learn more about what you can do with MyChart, go to [email or web address].

## 2024-02-13 NOTE — Progress Notes (Signed)
 Cardiology Office Note   Date:  02/13/2024  ID:  TAYJON HALLADAY, DOB [birth year], MRN [id number] PCP: Cleotilde Planas, MD  [facility] Providers Cardiologist:  None     History of Present Illness [name] is a 53 y.o. male with a hx of early onset coronary atherosclerosis,, coronary syndrome X related chest pain, myocardial bridge, hypercholesterolemia, hypertension, OSA and asthma.  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  He had a good year with very few episodes of chest discomfort.  He thinks has been at least 3 months since last event occurred.  It has probably been a year since he last took nitroglycerin .  He is exercising regularly.  He does power yoga every morning and then exercises on an indoor bicycle for 10 miles every afternoon.  He uses CPAP conscientiously.  He denies problems with dizziness, palpitations or syncope.  He had some worsening shortness of breath that improved after adjustment of his bronchodilators.  Marcey is had problems with chest pain as far back as 2011 when he was only 54 years old.  His coronary calcium  score is high placing him in the 88th percentile for age, but previous invasive angiography in 2011 and coronary CT angiogram in 2021 did not show evidence of severe coronary stenoses.  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  Most recent evaluation was coronary CT angiography in 08/21/2022 that showed minimal stenoses (less than 25% scattered throughout the left coronary system) with a moderately elevated calcium  score of 83 (85th percentile); there was no evidence of aortic aneurysm but he did have a small hiatal hernia.  He has a strong family history of early onset CAD.  His brother had a myocardial infarction at age 34 and has received 5 stents.  His paternal grandfather died of a ruptured AAA.  His father died of lung cancer at age 20 (probably due to a combination of smoking and environmental exposure).  Blood  pressure is very well-controlled.  He does not have diabetes mellitus.  Most recent lipid profile showed an LDL of 65 and HDL of 40 with normal triglycerides.   Studies Reviewed EKG Interpretation Date/Time:  Wednesday February 13 2024 07:49:12 EDT Ventricular Rate:  73 PR Interval:  154 QRS Duration:  84 QT Interval:  390 QTC Calculation: 429 R Axis:   11  Text Interpretation: Normal sinus rhythm Normal ECG When compared with ECG of 15-Aug-2011 11:23, No significant change was found Confirmed by Allyn Bartelson [id number]) on 02/13/2024 8:00:07 AM     Risk Assessment/Calculations           Physical Exam VS:  BP 122/78 (BP Location: Left Arm, Patient Position: Sitting, Cuff Size: Large)   Pulse 73   Ht 5' 9 (1.753 m)   Wt 229 lb 4.8 oz (104 kg)   SpO2 98%   BMI 33.86 kg/m        Wt Readings from Last 3 Encounters:  02/13/24 229 lb 4.8 oz (104 kg)  10/29/23 235 lb 3.2 oz (106.7 kg)  06/19/23 236 lb 12.8 oz (107.4 kg)    GEN: Well nourished, well developed in no acute distress.  Mildly obese NECK: No JVD; No carotid bruits CARDIAC: RRR, no murmurs, rubs, gallops.  Excellent distal pulses in all 4 extremities. RESPIRATORY:  Clear to auscultation without rales, wheezing or rhonchi  ABDOMEN: Soft, non-tender, non-distended EXTREMITIES:  No edema; No deformity   ASSESSMENT AND PLAN  CAD: Early onset  atherosclerosis, but without meaningful stenoses on a recent coronary CT angiogram and currently asymptomatic.  The focus remains on risk factor modification.  Continue healthy diet and regular exercise. HLP: Excellent LDL cholesterol on combination rosuvastatin  and ezetimibe , but with  HTN: Very well-controlled on 3 drug combination of amlodipine  and carvedilol  (also useful for coronary vasospasm) plus losartan . Asthma: If necessary, we may need to switch from nonselective beta-blocker carvedilol  to a selective alpha plus beta 1 blocker like Nebivolol.  Currently though his breathing  appears to not be a problem. OSA: Compliant with CPAP and denies hypersomnolence during the day       Dispo: Follow-up 1 year  Signed, Jerel Balding, MD

## 2024-02-14 ENCOUNTER — Other Ambulatory Visit: Payer: Self-pay

## 2024-02-14 ENCOUNTER — Other Ambulatory Visit (HOSPITAL_COMMUNITY): Payer: Self-pay

## 2024-02-18 ENCOUNTER — Other Ambulatory Visit: Payer: Self-pay

## 2024-02-19 ENCOUNTER — Other Ambulatory Visit (HOSPITAL_COMMUNITY): Payer: Self-pay

## 2024-02-22 ENCOUNTER — Other Ambulatory Visit: Payer: Self-pay

## 2024-02-22 ENCOUNTER — Ambulatory Visit: Admitting: Diagnostic Neuroimaging

## 2024-02-22 ENCOUNTER — Encounter: Payer: Self-pay | Admitting: Diagnostic Neuroimaging

## 2024-02-22 ENCOUNTER — Encounter (HOSPITAL_COMMUNITY): Payer: Self-pay

## 2024-02-22 ENCOUNTER — Other Ambulatory Visit (HOSPITAL_COMMUNITY): Payer: Self-pay

## 2024-02-22 VITALS — BP 129/81 | HR 72 | Ht 69.0 in | Wt 229.0 lb

## 2024-02-22 DIAGNOSIS — G43101 Migraine with aura, not intractable, with status migrainosus: Secondary | ICD-10-CM

## 2024-02-22 DIAGNOSIS — R519 Headache, unspecified: Secondary | ICD-10-CM

## 2024-02-22 MED ORDER — AIMOVIG 70 MG/ML ~~LOC~~ SOAJ
70.0000 mg | SUBCUTANEOUS | 4 refills | Status: DC
  Filled 2024-02-22 – 2024-02-25 (×2): qty 3, 90d supply, fill #0
  Filled 2024-02-25: qty 3, 84d supply, fill #0
  Filled 2024-05-14: qty 3, 84d supply, fill #1

## 2024-02-22 NOTE — Patient Instructions (Addendum)
  MIGRAINE PREVENTION  LIFESTYLE CHANGES -Stop or avoid smoking -Decrease or avoid caffeine / alcohol -Eat and sleep on a regular schedule -Exercise several times per week  - topiramate --> tried and had numbness  - cannot use propranolol --> currently on carvedilol  - start erenumab  (Aimovig ) 70mg  monthly (may increase to 140mg  monthly) - consider atogepant Woody) 60mg  daily  MIGRAINE RESCUE  - ibuprofen , tylenol  as needed - AVOID TRIPTANS (coronary artery disease) - trial of rimegepant (Nurtec) 75mg  as needed for breakthrough headache; max 8 per month - consider ubrogepant  (Ubrelvy ) 100mg  as needed for breakthrough headache; may repeat x 1 after 2 hours; max 2 tabs per day or 8 per month

## 2024-02-22 NOTE — Progress Notes (Signed)
 [facility]  PATIENT: [name] DOB: Jun 13, 1970  REFERRING CLINICIAN: Pridgen, Taylar, NP HISTORY FROM: patient  REASON FOR VISIT: new consult   HISTORICAL  CHIEF COMPLAINT:  Chief Complaint  Patient presents with   New Patient (Initial Visit)    Pt alone, rm 7, Hemorrhage in retina in 2019. Noted some vision changes and headache around may. CT of head completed June. He went and saw Eye MD  who felt like this symptoms. Has hx of migraines and would have word finding problems but never had the vision. He states that he will get a migraine that starts in eye and shoots to the back of the head. This particular headache lasted 16 days. He has tried nurtec and that worsened. He has imitrex  intermittently. Avg he will have 8/10 a month.    HISTORY OF PRESENT ILLNESS:   53 year old male here for evaluation of headaches.  Patient has history of migraine headache since teenage years.  He describes right-sided throbbing and pounding headaches associate with sensitivity light and sound, nausea.  Sometimes has pain in his left eye rating to the left occipital region.  Sometimes preceded by word finding difficulties.  Family history of migraine in mother and brother.  Triggering factors include weather changes.  Averaging 8 to 10 days of migraine per month.  Has tried topiramate but this caused numbness.  Has tried Imitrex  but does have some coronary artery disease and therefore is cautious with this medication.  Tried Nurtec 1 time but this was associated with increased headache.  May 2025 patient had more severe headache lasting 16 days associated with vision changes of tunnel vision and pulsating sensation.   REVIEW OF SYSTEMS: Full 14 system review of systems performed and negative with exception of: As per HPI.  ALLERGIES: Allergies  Allergen Reactions   Ansaid [Ocufen] Nausea And Vomiting   Chantix [Varenicline Tartrate] Other (See Comments)    Gi  intolerances    Codeine Other (See Comments)    Gi intolerance    Erythromycin Other (See Comments)    Gi intolerances     HOME MEDICATIONS: Outpatient Medications Prior to Visit  Medication Sig Dispense Refill   albuterol  (PROAIR  HFA) 108 (90 Base) MCG/ACT inhaler Inhale 2 puffs into the lungs daily as needed. 6.7 g 1   Albuterol -Budesonide  (AIRSUPRA ) 90-80 MCG/ACT AERO Inhale 2 puffs into the lungs every 6 (six) hours as needed. 10.7 g 2   amLODipine  (NORVASC ) 5 MG tablet Take 1 tablet (5 mg total) by mouth daily. 90 tablet 3   azelastine  (ASTELIN ) 0.1 % nasal spray Place 2 sprays into both nostrils 2 (two) times daily. Use in each nostril as directed 30 mL 12   benzonatate  (TESSALON ) 100 MG capsule Take 1 capsule (100 mg total) by mouth 2 (two) times daily as needed for cough. 20 capsule 0   carvedilol  (COREG ) 6.25 MG tablet Take 1 tablet (6.25 mg total) by mouth 2 (two) times daily. 180 tablet 3   citalopram  (CELEXA ) 40 MG tablet Take 1 tablet (40 mg total) by mouth daily. 90 tablet 1   clobetasol  cream (TEMOVATE ) 0.05 % Apply 1 application  topically as needed.     Coenzyme Q10 (CO Q 10) 100 MG CAPS Take 1 capsule by mouth daily in the afternoon.     ezetimibe  (ZETIA ) 10 MG tablet Take 1 tablet (10 mg total) by mouth daily. 90 tablet 3   fluticasone -salmeterol (ADVAIR  HFA) 230-21 MCG/ACT inhaler Inhale 2 puffs into the  lungs 2 (two) times daily. 12 g 12   glucosamine-chondroitin 500-400 MG tablet Take 1 tablet by mouth daily at 6 (six) AM.     hydrocortisone  2.5 % cream Apply small amount to affected area of eyebrows twice a day. 30 g 2   ibuprofen  (ADVIL ) 800 MG tablet Take 800 mg by mouth as needed.     isosorbide  mononitrate (IMDUR ) 30 MG 24 hr tablet Take 1 tablet (30 mg total) by mouth daily. 90 tablet 3   losartan  (COZAAR ) 100 MG tablet Take 1 tablet (100 mg total) by mouth daily. 90 tablet 3   Multiple Vitamin (MULTIVITAMIN) tablet Take 1 tablet by mouth daily.      nitroGLYCERIN  (NITROSTAT ) 0.4 MG SL tablet Place 1 tablet (0.4 mg total) under the tongue every 5 (five) minutes as needed for chest pain. 25 tablet 3   pantoprazole  (PROTONIX ) 40 MG tablet Take 40 mg by mouth daily.     pantoprazole  (PROTONIX ) 40 MG tablet Take 1 tablet (40 mg total) by mouth 1/2 to 1 hour before morning meal once a day. 90 tablet 0   predniSONE  (DELTASONE ) 10 MG tablet Take 4 tablets by mouth daily with food x 4 days, then 3 tabs daily x 4 days, then 2 tabs daily x 4 days, then 1 tab daily x4 days then stop. 40 tablet 0   rosuvastatin  (CRESTOR ) 20 MG tablet Take 1 tablet (20 mg total) by mouth daily. 90 tablet 3   tamsulosin  (FLOMAX ) 0.4 MG CAPS capsule Take 1 capsule (0.4 mg total) by mouth daily 30 minutes after evening meal. 30 capsule 11   triamcinolone  cream (KENALOG ) 0.1 % Apply a small amount to skin twice a day 80 g 2   SUMAtriptan  (IMITREX ) 25 MG tablet Take 1 tablet (25 mg total) by mouth daily as needed. 30 tablet 1   No facility-administered medications prior to visit.    PAST MEDICAL HISTORY: Past Medical History:  Diagnosis Date   Asthma    Coronary artery disease CARDIOLOGIST - DR VICTORY SHARPS--- LAST VISIT OCT 2012   DENIES S & S   Depression    Dysuria    GERD (gastroesophageal reflux disease)    H/O hiatal hernia    History of angina pectoris    Hypertension    Kidney calculi    Left ureteral calculus    Myocardial bridge MID LAD   Shortness of breath    with excertion    PAST SURGICAL HISTORY: Past Surgical History:  Procedure Laterality Date   CARDIAC CATHETERIZATION  01-07-2010-- DR VICTORY SHARPS   NORMAL LVF/ PATENT CORONARY ARTERIES/ MYOCARDIAL BRIDGE MID LAD   CYSTOSCOPY W/ URETERAL STENT PLACEMENT  08/15/2011   Procedure: CYSTOSCOPY WITH STENT REPLACEMENT;  Surgeon: Donnice Gwenyth Brooks, MD;  Location: [facility];  Service: Urology;  Laterality: Left;  stone basket extraction left, left ureteroscopy   EXTRACORPOREAL SHOCK  WAVE LITHOTRIPSY  08-07-11   LEFT   VERICOCELECTOMY  1992    FAMILY HISTORY: Family History  Problem Relation Age of Onset   Emphysema Paternal Grandfather    Aortic aneurysm Paternal Grandfather    Heart attack Brother    Cancer - Lung Father    Hypertension Mother    Hypertension Paternal Grandmother    Alzheimer's disease Maternal Grandmother    Stroke Maternal Grandfather     SOCIAL HISTORY: Social History   Socioeconomic History   Marital status: Married    Spouse name: Not on file  Number of children: 0   Years of education: Not on file   Highest education level: Not on file  Occupational History   Not on file  Tobacco Use   Smoking status: Former    Current packs/day: 0.00    Average packs/day: 3.0 packs/day for 10.0 years (30.0 ttl pk-yrs)    Types: Cigarettes, Cigars    Start date: 08/14/1995    Quit date: 08/13/2005    Years since quitting: 18.5   Smokeless tobacco: Former  Building services engineer status: Never Used  Substance and Sexual Activity   Alcohol use: No    Alcohol/week: 0.0 standard drinks of alcohol   Drug use: No   Sexual activity: Not on file  Other Topics Concern   Not on file  Social History Narrative   Not on file   Social Drivers of Health   Financial Resource Strain: Not on file  Food Insecurity: Not on file  Transportation Needs: Not on file  Physical Activity: Not on file  Stress: Not on file  Social Connections: Not on file  Intimate Partner Violence: Not on file     PHYSICAL EXAM  GENERAL EXAM/CONSTITUTIONAL: Vitals:  Vitals:   02/22/24 0806  BP: 129/81  Pulse: 72  Weight: 229 lb (103.9 kg)  Height: 5' 9 (1.753 m)   Body mass index is 33.82 kg/m. Wt Readings from Last 3 Encounters:  02/22/24 229 lb (103.9 kg)  02/13/24 229 lb 4.8 oz (104 kg)  10/29/23 235 lb 3.2 oz (106.7 kg)   Patient is in no distress; well developed, nourished and groomed; neck is supple  CARDIOVASCULAR: Examination of carotid arteries is  normal; no carotid bruits Regular rate and rhythm, no murmurs Examination of peripheral vascular system by observation and palpation is normal  EYES: Ophthalmoscopic exam of optic discs and posterior segments is normal; no papilledema or hemorrhages No results found.  MUSCULOSKELETAL: Gait, strength, tone, movements noted in Neurologic exam below  NEUROLOGIC: MENTAL STATUS:      No data to display         awake, alert, oriented to person, place and time recent and remote memory intact normal attention and concentration language fluent, comprehension intact, naming intact fund of knowledge appropriate  CRANIAL NERVE:  2nd - no papilledema on fundoscopic exam 2nd, 3rd, 4th, 6th - pupils equal and reactive to light, visual fields full to confrontation, extraocular muscles intact, no nystagmus 5th - facial sensation symmetric 7th - facial strength symmetric 8th - hearing intact 9th - palate elevates symmetrically, uvula midline 11th - shoulder shrug symmetric 12th - tongue protrusion midline  MOTOR:  normal bulk and tone, full strength in the BUE, BLE  SENSORY:  normal and symmetric to light touch, temperature, vibration  COORDINATION:  finger-nose-finger, fine finger movements normal  REFLEXES:  deep tendon reflexes present and symmetric  GAIT/STATION:  narrow based gait     DIAGNOSTIC DATA (LABS, IMAGING, TESTING) - I reviewed patient records, labs, notes, testing and imaging myself where available.  Lab Results  Component Value Date   WBC 5.8 09/01/2020   HGB 14.2 09/01/2020   HCT 40.2 09/01/2020   MCV 80.6 09/01/2020   PLT 227.0 09/01/2020      Component Value Date/Time   NA 133 (L) 10/30/2023 0804   NA 139 08/18/2022 1025   K 3.8 10/30/2023 0804   CL 100 10/30/2023 0804   CO2 26 10/30/2023 0804   GLUCOSE 122 (H) 10/30/2023 0804   BUN 16 10/30/2023  0804   BUN 20 08/18/2022 1025   CREATININE 1.07 10/30/2023 0804   CREATININE 1.32 01/05/2017  1444   CALCIUM  9.3 10/30/2023 0804   PROT 7.0 07/30/2019 0956   ALBUMIN 4.7 07/30/2019 0956   AST 46 (H) 07/30/2019 0956   ALT 92 (H) 07/30/2019 0956   ALKPHOS 64 07/30/2019 0956   BILITOT 0.6 07/30/2019 0956   GFRNONAA >60 10/30/2023 0804   GFRNONAA 65 01/05/2017 1444   GFRAA 90 12/29/2019 0740   GFRAA 75 01/05/2017 1444   Lab Results  Component Value Date   CHOL 124 10/30/2023   HDL 40 (L) 10/30/2023   LDLCALC 65 10/30/2023   TRIG 94 10/30/2023   CHOLHDL 3.1 10/30/2023   Lab Results  Component Value Date   HGBA1C  01/06/2010    5.2 (NOTE)                                                                       According to the ADA Clinical Practice Recommendations for 2011, when HbA1c is used as a screening test:   >=6.5%   Diagnostic of Diabetes Mellitus           (if abnormal result  is confirmed)  5.7-6.4%   Increased risk of developing Diabetes Mellitus  References:Diagnosis and Classification of Diabetes Mellitus,Diabetes Care,2011,34(Suppl 1):S62-S69 and Standards of Medical Care in         Diabetes - 2011,Diabetes Care,2011,34  (Suppl 1):S11-S61.   No results found for: VITAMINB12 Lab Results  Component Value Date   TSH 1.81 01/05/2017    04/03/16 MRI brain (with and without) [I reviewed images myself and agree with interpretation. -VRP]  - normal   11/13/23 CT head  - Normal brain.    ASSESSMENT AND PLAN  53 y.o. year old male here with:  Dx:  1. Worsening headaches   2. Migraine with aura and with status migrainosus, not intractable     PLAN:  WORSENING HEADACHES - check MRI brain (rule out other secondary causes)  MIGRAINE PREVENTION  LIFESTYLE CHANGES -Stop or avoid smoking -Decrease or avoid caffeine / alcohol -Eat and sleep on a regular schedule -Exercise several times per week  - topiramate --> tried and had numbness  - cannot use propranolol --> currently on carvedilol  - start erenumab  (Aimovig ) 70mg  monthly (may increase to 140mg   monthly) - consider atogepant (Qulipta) 60mg  daily  MIGRAINE RESCUE  - ibuprofen , tylenol  as needed - AVOID TRIPTANS (coronary artery disease) - trial of rimegepant (Nurtec) 75mg  as needed for breakthrough headache; max 8 per month - consider ubrogepant  (Ubrelvy ) 100mg  as needed for breakthrough headache; may repeat x 1 after 2 hours; max 2 tabs per day or 8 per month  Orders Placed This Encounter  Procedures   MR BRAIN W WO CONTRAST   Meds ordered this encounter  Medications   Erenumab -aooe (AIMOVIG ) 70 MG/ML SOAJ    Sig: Inject 70 mg into the skin every 30 (thirty) days.    Dispense:  3 mL    Refill:  4   Return in about 4 months (around 06/23/2024) for MyChart visit (15 min).    EDUARD FABIENE HANLON, MD 02/22/2024, 9:09 AM Certified in Neurology, Neurophysiology and Neuroimaging  [facility] [address] [place], [place] [address] [phone]

## 2024-02-25 ENCOUNTER — Other Ambulatory Visit: Payer: Self-pay

## 2024-02-25 ENCOUNTER — Other Ambulatory Visit (HOSPITAL_COMMUNITY): Payer: Self-pay

## 2024-02-25 ENCOUNTER — Telehealth: Payer: Self-pay

## 2024-02-25 NOTE — Telephone Encounter (Signed)
 Prior authorization for Aimovig  70mg /ml has been Approved through [employer] pharmacy benefit. Approval dates: 02/23/24 - 08/21/24. Reference # [id number] Approved for 1ml per 28 days.

## 2024-02-26 ENCOUNTER — Other Ambulatory Visit (HOSPITAL_COMMUNITY): Payer: Self-pay

## 2024-02-27 ENCOUNTER — Ambulatory Visit (INDEPENDENT_AMBULATORY_CARE_PROVIDER_SITE_OTHER)

## 2024-02-27 DIAGNOSIS — R519 Headache, unspecified: Secondary | ICD-10-CM

## 2024-02-27 MED ORDER — GADOBENATE DIMEGLUMINE 529 MG/ML IV SOLN
20.0000 mL | Freq: Once | INTRAVENOUS | Status: AC | PRN
  Administered 2024-02-27: 20 mL via INTRAVENOUS

## 2024-03-02 ENCOUNTER — Other Ambulatory Visit (HOSPITAL_COMMUNITY): Payer: Self-pay

## 2024-03-04 ENCOUNTER — Ambulatory Visit: Payer: Self-pay | Admitting: Diagnostic Neuroimaging

## 2024-03-05 ENCOUNTER — Other Ambulatory Visit (HOSPITAL_COMMUNITY): Payer: Self-pay

## 2024-03-07 ENCOUNTER — Encounter (HOSPITAL_BASED_OUTPATIENT_CLINIC_OR_DEPARTMENT_OTHER): Payer: Self-pay | Admitting: Pulmonary Disease

## 2024-03-07 ENCOUNTER — Other Ambulatory Visit: Payer: Self-pay

## 2024-03-07 ENCOUNTER — Ambulatory Visit (HOSPITAL_BASED_OUTPATIENT_CLINIC_OR_DEPARTMENT_OTHER): Admitting: Pulmonary Disease

## 2024-03-07 ENCOUNTER — Other Ambulatory Visit (HOSPITAL_BASED_OUTPATIENT_CLINIC_OR_DEPARTMENT_OTHER): Payer: Self-pay

## 2024-03-07 VITALS — BP 124/77 | HR 77 | Ht 69.0 in | Wt 227.0 lb

## 2024-03-07 DIAGNOSIS — Z23 Encounter for immunization: Secondary | ICD-10-CM

## 2024-03-07 DIAGNOSIS — J4540 Moderate persistent asthma, uncomplicated: Secondary | ICD-10-CM

## 2024-03-07 DIAGNOSIS — G4733 Obstructive sleep apnea (adult) (pediatric): Secondary | ICD-10-CM

## 2024-03-07 MED ORDER — MONTELUKAST SODIUM 10 MG PO TABS
10.0000 mg | ORAL_TABLET | Freq: Every day | ORAL | 11 refills | Status: DC
  Filled 2024-03-07 (×2): qty 30, 30d supply, fill #0

## 2024-03-07 MED ORDER — MONTELUKAST SODIUM 10 MG PO TABS
10.0000 mg | ORAL_TABLET | Freq: Every day | ORAL | 11 refills | Status: AC
  Filled 2024-03-07: qty 30, 30d supply, fill #0
  Filled 2024-03-29: qty 90, 90d supply, fill #0
  Filled 2024-03-31: qty 30, 30d supply, fill #0
  Filled 2024-05-03: qty 30, 30d supply, fill #1
  Filled 2024-05-30: qty 30, 30d supply, fill #2
  Filled 2024-06-27: qty 90, 90d supply, fill #3

## 2024-03-07 MED ORDER — COMIRNATY 30 MCG/0.3ML IM SUSY
0.3000 mL | PREFILLED_SYRINGE | Freq: Once | INTRAMUSCULAR | 0 refills | Status: AC
Start: 2024-03-07 — End: 2024-03-08
  Filled 2024-03-07: qty 0.3, 1d supply, fill #0

## 2024-03-07 NOTE — Telephone Encounter (Signed)
 CPAP DL

## 2024-03-07 NOTE — Addendum Note (Signed)
 Addended by: Yazmine Sorey O on: 03/07/2024 01:21 PM   Modules accepted: Orders

## 2024-03-07 NOTE — Patient Instructions (Addendum)
 X Rx for singulair  daily  Continue on advair  twice daily  X blood work today  X flu shot   VISIT SUMMARY: Today, you came in for a follow-up visit to check on your moderate persistent asthma and obstructive sleep apnea. Your asthma is showing improvement with your current medication, and your sleep apnea continues to be well-managed with CPAP therapy.  YOUR PLAN: -MODERATE PERSISTENT ASTHMA: Moderate persistent asthma is a condition where the airways in your lungs are inflamed and narrowed, making it hard to breathe. Your asthma is currently well-controlled with Advair , and you are using Airsupra  as needed. We will continue your current dose of Advair  for six more months. You will also start taking montelukast  (Singulair ) 10 mg at night to help with additional control. We will check your blood for eosinophil count and perform an allergy panel to establish a baseline and check your IgE levels.  -OBSTRUCTIVE SLEEP APNEA: Obstructive sleep apnea is a condition where your breathing stops and starts during sleep due to blocked airways. Your condition is well-managed with CPAP therapy, and no changes are needed at this time.  INSTRUCTIONS: Please continue taking Advair  at the current dose and use Airsupra  as needed. Start taking montelukast  (Singulair ) 10 mg at night. We will also need to do a blood test to check your eosinophil count and an allergy panel to check your IgE levels. Follow up in six months for a re-evaluation.                      Contains text generated by Abridge.                                 Contains text generated by Abridge.

## 2024-03-07 NOTE — Progress Notes (Signed)
   Subjective:    Patient ID: [name], male    DOB: 12-Nov-1970, 53 y.o.   MRN: [id number]    53 yo man for FU of  moderate OSA & exercise induced Asthma    PMH : CAD ,myocardial bridge    Former smoker- max 3 PPD , quit 2007   He experienced well-controlled exercise-induced asthma until a respiratory illness in January, after which he required prednisone  and was started on Pulmicort . He was later switched to a higher dose of Symbicort .  10/2023 high dose advair         Discussed the use of AI scribe software for clinical note transcription with the patient, who gave verbal consent to proceed.  History of Present Illness [name] is a 53 year old male with moderate persistent asthma who presents for a four month follow-up.  Advair  has been effective for his asthma, and he uses Airsupra  as a rescue inhaler once or twice a week. In the past month, he experienced two episodes of nocturnal dyspnea, which were less severe than previous episodes and resolved quickly with Airsupra . His lung function test showed 84% with Advair  taken on the morning of the test.  CPAP is functioning well without issues. His weight is gradually decreasing due to increased exercise capacity, including daily stationary biking and power yoga.  His father died at 79 from metastatic lung cancer related to smoking and a radiation accident. He quit smoking a long time ago.    Significant tests/ events  08/2020 AEC 100   Home sleep study 05/2014 showed moderate obstructive sleep apnea, events were about 14 per hour.   AutoCPAP download 07/2014 >> good usage >7h, no residuals, avg 8 cm   10/2014 CPAP 8 cm>> no residulas or leak, good usage PFTs 08/2020 nml     PFTs 12/2015 nml   Review of Systems  neg for any significant sore throat, dysphagia, itching, sneezing, nasal congestion or excess/ purulent secretions, fever, chills, sweats, unintended wt loss, pleuritic or exertional cp,  hempoptysis, orthopnea pnd or change in chronic leg swelling. Also denies presyncope, palpitations, heartburn, abdominal pain, nausea, vomiting, diarrhea or change in bowel or urinary habits, dysuria,hematuria, rash, arthralgias, visual complaints, headache, numbness weakness or ataxia.      Objective:   Physical Exam  Gen. Pleasant, well-nourished, in no distress ENT - no thrush, no pallor/icterus,no post nasal drip Neck: No JVD, no thyromegaly, no carotid bruits Lungs: no use of accessory muscles, no dullness to percussion, clear without rales or rhonchi  Cardiovascular: Rhythm regular, heart sounds  normal, no murmurs or gallops, no peripheral edema Musculoskeletal: No deformities, no cyanosis or clubbing        Assessment & Plan:   Assessment and Plan Assessment & Plan Moderate persistent asthma Moderate persistent asthma with improved control on higher dose Advair . Lung function at 84% with Advair , slightly lower than previous 90% but not showing obstructive pattern. Episodes of nocturnal dyspnea reduced to twice a month, effectively managed with Airsupra . Consideration of montelukast  (Singulair ) for additional control, with discussion of potential neuropsychiatric side effects. - Continue Advair  at current higher dose for six months - Use Airsupra  as needed for rescue, currently 1-2 times per week - Initiate montelukast  (Singulair ) 10 mg at night - Order CBC with differential to check eosinophil count - Order [facility]  allergy panel to establish baseline and check IgE levels  Obstructive sleep apnea Obstructive sleep apnea well-managed with CPAP therapy.

## 2024-03-09 LAB — ALLERGENS W/TOTAL IGE AREA 2
Alternaria Alternata IgE: 0.15 kU/L — AB
Aspergillus Fumigatus IgE: <0.1 kU/L
Bermuda Grass IgE: 0.11 kU/L — AB
Cat Dander IgE: <0.1 kU/L
Cedar, Mountain IgE: <0.1 kU/L
Cladosporium Herbarum IgE: <0.1 kU/L
Cockroach, German IgE: <0.1 kU/L
Common Silver Birch IgE: <0.1 kU/L
Cottonwood IgE: 0.1 kU/L — AB
D Farinae IgE: <0.1 kU/L
D Pteronyssinus IgE: <0.1 kU/L
Dog Dander IgE: <0.1 kU/L
Elm, American IgE: <0.1 kU/L
IgE (Immunoglobulin E), Serum: 13 [IU]/mL (ref 6–495)
Johnson Grass IgE: <0.1 kU/L
Maple/Box Elder IgE: 0.13 kU/L — AB
Mouse Urine IgE: <0.1 kU/L
Oak, White IgE: <0.1 kU/L
Pecan, Hickory IgE: <0.1 kU/L
Penicillium Chrysogen IgE: <0.1 kU/L
Pigweed, Rough IgE: <0.1 kU/L
Ragweed, Short IgE: 0.1 kU/L — AB
Sheep Sorrel IgE Qn: <0.1 kU/L
Timothy Grass IgE: 0.1 kU/L — AB
White Mulberry IgE: <0.1 kU/L

## 2024-03-09 LAB — CBC WITH DIFFERENTIAL/PLATELET
Basophils Absolute: 0 x10E3/uL (ref 0.0–0.2)
Basos: 1 %
EOS (ABSOLUTE): 0.1 x10E3/uL (ref 0.0–0.4)
Eos: 2 %
Hematocrit: 46.7 % (ref 37.5–51.0)
Hemoglobin: 15.6 g/dL (ref 13.0–17.7)
Immature Grans (Abs): 0 x10E3/uL (ref 0.0–0.1)
Immature Granulocytes: 0 %
Lymphocytes Absolute: 1.7 x10E3/uL (ref 0.7–3.1)
Lymphs: 32 %
MCH: 26.8 pg (ref 26.6–33.0)
MCHC: 33.4 g/dL (ref 31.5–35.7)
MCV: 80 fL (ref 79–97)
Monocytes Absolute: 0.5 x10E3/uL (ref 0.1–0.9)
Monocytes: 8 %
Neutrophils Absolute: 3.1 x10E3/uL (ref 1.4–7.0)
Neutrophils: 57 %
Platelets: 235 x10E3/uL (ref 150–450)
RBC: 5.82 x10E6/uL — ABNORMAL HIGH (ref 4.14–5.80)
RDW: 20.6 % — ABNORMAL HIGH (ref 11.6–15.4)
WBC: 5.4 x10E3/uL (ref 3.4–10.8)

## 2024-03-10 ENCOUNTER — Ambulatory Visit: Payer: Self-pay | Admitting: Pulmonary Disease

## 2024-03-29 ENCOUNTER — Other Ambulatory Visit (HOSPITAL_COMMUNITY): Payer: Self-pay

## 2024-03-31 ENCOUNTER — Other Ambulatory Visit (HOSPITAL_COMMUNITY): Payer: Self-pay

## 2024-03-31 ENCOUNTER — Other Ambulatory Visit: Payer: Self-pay

## 2024-04-01 ENCOUNTER — Other Ambulatory Visit: Payer: Self-pay

## 2024-04-03 DIAGNOSIS — Z6832 Body mass index (BMI) 32.0-32.9, adult: Secondary | ICD-10-CM | POA: Diagnosis not present

## 2024-04-03 DIAGNOSIS — E785 Hyperlipidemia, unspecified: Secondary | ICD-10-CM | POA: Diagnosis not present

## 2024-04-03 DIAGNOSIS — K219 Gastro-esophageal reflux disease without esophagitis: Secondary | ICD-10-CM | POA: Diagnosis not present

## 2024-04-03 DIAGNOSIS — F1021 Alcohol dependence, in remission: Secondary | ICD-10-CM | POA: Diagnosis not present

## 2024-04-03 DIAGNOSIS — I10 Essential (primary) hypertension: Secondary | ICD-10-CM | POA: Diagnosis not present

## 2024-04-03 DIAGNOSIS — G43009 Migraine without aura, not intractable, without status migrainosus: Secondary | ICD-10-CM | POA: Diagnosis not present

## 2024-04-03 DIAGNOSIS — Z0000 Encounter for general adult medical examination without abnormal findings: Secondary | ICD-10-CM | POA: Diagnosis not present

## 2024-04-03 DIAGNOSIS — F325 Major depressive disorder, single episode, in full remission: Secondary | ICD-10-CM | POA: Diagnosis not present

## 2024-04-03 DIAGNOSIS — G4730 Sleep apnea, unspecified: Secondary | ICD-10-CM | POA: Diagnosis not present

## 2024-04-03 DIAGNOSIS — N401 Enlarged prostate with lower urinary tract symptoms: Secondary | ICD-10-CM | POA: Diagnosis not present

## 2024-04-08 ENCOUNTER — Encounter (HOSPITAL_BASED_OUTPATIENT_CLINIC_OR_DEPARTMENT_OTHER): Payer: Self-pay | Admitting: Pulmonary Disease

## 2024-04-11 ENCOUNTER — Encounter (INDEPENDENT_AMBULATORY_CARE_PROVIDER_SITE_OTHER): Admitting: Ophthalmology

## 2024-04-11 DIAGNOSIS — H43813 Vitreous degeneration, bilateral: Secondary | ICD-10-CM

## 2024-04-11 DIAGNOSIS — I10 Essential (primary) hypertension: Secondary | ICD-10-CM

## 2024-04-11 DIAGNOSIS — H4421 Degenerative myopia, right eye: Secondary | ICD-10-CM

## 2024-04-11 DIAGNOSIS — H33303 Unspecified retinal break, bilateral: Secondary | ICD-10-CM

## 2024-04-11 DIAGNOSIS — H35033 Hypertensive retinopathy, bilateral: Secondary | ICD-10-CM

## 2024-04-11 DIAGNOSIS — H442A2 Degenerative myopia with choroidal neovascularization, left eye: Secondary | ICD-10-CM | POA: Diagnosis not present

## 2024-04-19 ENCOUNTER — Other Ambulatory Visit (HOSPITAL_COMMUNITY): Payer: Self-pay

## 2024-04-21 ENCOUNTER — Other Ambulatory Visit: Payer: Self-pay

## 2024-04-21 ENCOUNTER — Other Ambulatory Visit (HOSPITAL_COMMUNITY): Payer: Self-pay

## 2024-04-21 DIAGNOSIS — Z1211 Encounter for screening for malignant neoplasm of colon: Secondary | ICD-10-CM | POA: Diagnosis not present

## 2024-04-21 DIAGNOSIS — Z1212 Encounter for screening for malignant neoplasm of rectum: Secondary | ICD-10-CM | POA: Diagnosis not present

## 2024-04-21 MED ORDER — PANTOPRAZOLE SODIUM 40 MG PO TBEC
40.0000 mg | DELAYED_RELEASE_TABLET | Freq: Every morning | ORAL | 0 refills | Status: AC
  Filled 2024-04-21: qty 90, 90d supply, fill #0

## 2024-04-21 MED ORDER — CITALOPRAM HYDROBROMIDE 40 MG PO TABS
40.0000 mg | ORAL_TABLET | Freq: Every day | ORAL | 1 refills | Status: AC
  Filled 2024-04-21: qty 90, 90d supply, fill #0

## 2024-05-05 ENCOUNTER — Other Ambulatory Visit (HOSPITAL_COMMUNITY): Payer: Self-pay

## 2024-05-05 ENCOUNTER — Other Ambulatory Visit: Payer: Self-pay

## 2024-05-10 ENCOUNTER — Other Ambulatory Visit (HOSPITAL_COMMUNITY): Payer: Self-pay

## 2024-05-14 ENCOUNTER — Other Ambulatory Visit: Payer: Self-pay

## 2024-05-28 DIAGNOSIS — H2513 Age-related nuclear cataract, bilateral: Secondary | ICD-10-CM | POA: Diagnosis not present

## 2024-05-28 DIAGNOSIS — H43813 Vitreous degeneration, bilateral: Secondary | ICD-10-CM | POA: Diagnosis not present

## 2024-05-28 DIAGNOSIS — H35413 Lattice degeneration of retina, bilateral: Secondary | ICD-10-CM | POA: Diagnosis not present

## 2024-05-28 DIAGNOSIS — H3562 Retinal hemorrhage, left eye: Secondary | ICD-10-CM | POA: Diagnosis not present

## 2024-05-30 ENCOUNTER — Other Ambulatory Visit (HOSPITAL_COMMUNITY): Payer: Self-pay

## 2024-06-01 ENCOUNTER — Other Ambulatory Visit (HOSPITAL_COMMUNITY): Payer: Self-pay

## 2024-06-23 ENCOUNTER — Other Ambulatory Visit (HOSPITAL_BASED_OUTPATIENT_CLINIC_OR_DEPARTMENT_OTHER): Payer: Self-pay

## 2024-06-23 ENCOUNTER — Encounter: Payer: Self-pay | Admitting: Diagnostic Neuroimaging

## 2024-06-23 ENCOUNTER — Telehealth: Admitting: Diagnostic Neuroimaging

## 2024-06-23 DIAGNOSIS — G43101 Migraine with aura, not intractable, with status migrainosus: Secondary | ICD-10-CM

## 2024-06-23 MED ORDER — NURTEC 75 MG PO TBDP
75.0000 mg | ORAL_TABLET | Freq: Every day | ORAL | 6 refills | Status: AC | PRN
  Filled 2024-06-23: qty 8, 26d supply, fill #0

## 2024-06-23 MED ORDER — AIMOVIG 70 MG/ML ~~LOC~~ SOAJ
70.0000 mg | SUBCUTANEOUS | 4 refills | Status: AC
  Filled 2024-06-23: qty 3, 84d supply, fill #0

## 2024-06-23 NOTE — Progress Notes (Signed)
 "  [facility]  PATIENT: [name] DOB: 11-Oct-1970  REFERRING CLINICIAN: Cleotilde Planas, MD HISTORY FROM: patient  REASON FOR VISIT: follow up   HISTORICAL  CHIEF COMPLAINT:  Chief Complaint  Patient presents with   Migraine         HISTORY OF PRESENT ILLNESS:   UPDATE (06/23/24, VRP): Since last visit, doing well! Aimovig  is helping. Now only 6 HA in Nov, 3 in Dec, 2 in Jan. Nurtec also helping. Overall feeling better.   PRIOR HPI (02/22/24, VRP): 54 year old male here for evaluation of headaches.  Patient has history of migraine headache since teenage years.  He describes right-sided throbbing and pounding headaches associate with sensitivity light and sound, nausea.  Sometimes has pain in his left eye rating to the left occipital region.  Sometimes preceded by word finding difficulties.  Family history of migraine in mother and brother.  Triggering factors include weather changes.  Averaging 8 to 10 days of migraine per month.  Has tried topiramate but this caused numbness.  Has tried Imitrex  but does have some coronary artery disease and therefore is cautious with this medication.  Tried Nurtec 1 time but this was associated with increased headache.  May 2025 patient had more severe headache lasting 16 days associated with vision changes of tunnel vision and pulsating sensation.   REVIEW OF SYSTEMS: Full 14 system review of systems performed and negative with exception of: As per HPI.  ALLERGIES: Allergies  Allergen Reactions   Ansaid [Ocufen] Nausea And Vomiting   Chantix [Varenicline Tartrate] Other (See Comments)    Gi intolerances    Codeine Other (See Comments)    Gi intolerance    Erythromycin Other (See Comments)    Gi intolerances     HOME MEDICATIONS: Outpatient Medications Prior to Visit  Medication Sig Dispense Refill   albuterol  (PROAIR  HFA) 108 (90 Base) MCG/ACT inhaler Inhale 2 puffs into the lungs daily as needed. 6.7 g 1    Albuterol -Budesonide  (AIRSUPRA ) 90-80 MCG/ACT AERO Inhale 2 puffs into the lungs every 6 (six) hours as needed. 10.7 g 2   amLODipine  (NORVASC ) 5 MG tablet Take 1 tablet (5 mg total) by mouth daily. 90 tablet 3   azelastine  (ASTELIN ) 0.1 % nasal spray Place 2 sprays into both nostrils 2 (two) times daily. Use in each nostril as directed 30 mL 12   benzonatate  (TESSALON ) 100 MG capsule Take 1 capsule (100 mg total) by mouth 2 (two) times daily as needed for cough. 20 capsule 0   carvedilol  (COREG ) 6.25 MG tablet Take 1 tablet (6.25 mg total) by mouth 2 (two) times daily. 180 tablet 3   citalopram  (CELEXA ) 40 MG tablet Take 1 tablet (40 mg total) by mouth daily. 90 tablet 1   clobetasol  cream (TEMOVATE ) 0.05 % Apply 1 application  topically as needed.     Coenzyme Q10 (CO Q 10) 100 MG CAPS Take 1 capsule by mouth daily in the afternoon.     ezetimibe  (ZETIA ) 10 MG tablet Take 1 tablet (10 mg total) by mouth daily. 90 tablet 3   fluticasone -salmeterol (ADVAIR  HFA) 230-21 MCG/ACT inhaler Inhale 2 puffs into the lungs 2 (two) times daily. 12 g 12   glucosamine-chondroitin 500-400 MG tablet Take 1 tablet by mouth daily at 6 (six) AM.     hydrocortisone  2.5 % cream Apply small amount to affected area of eyebrows twice a day. 30 g 2   ibuprofen  (ADVIL ) 800 MG tablet Take 800 mg by mouth  as needed.     isosorbide  mononitrate (IMDUR ) 30 MG 24 hr tablet Take 1 tablet (30 mg total) by mouth daily. 90 tablet 3   losartan  (COZAAR ) 100 MG tablet Take 1 tablet (100 mg total) by mouth daily. 90 tablet 3   montelukast  (SINGULAIR ) 10 MG tablet Take 1 tablet (10 mg total) by mouth at bedtime. 30 tablet 11   Multiple Vitamin (MULTIVITAMIN) tablet Take 1 tablet by mouth daily.     nitroGLYCERIN  (NITROSTAT ) 0.4 MG SL tablet Place 1 tablet (0.4 mg total) under the tongue every 5 (five) minutes as needed for chest pain. 25 tablet 3   pantoprazole  (PROTONIX ) 40 MG tablet Take 40 mg by mouth daily.     pantoprazole   (PROTONIX ) 40 MG tablet Take 1 tablet (40 mg total) by mouth  1/2 to 1 hour before  morning meal. 90 tablet 0   predniSONE  (DELTASONE ) 10 MG tablet Take 4 tablets by mouth daily with food x 4 days, then 3 tabs daily x 4 days, then 2 tabs daily x 4 days, then 1 tab daily x4 days then stop. 40 tablet 0   rosuvastatin  (CRESTOR ) 20 MG tablet Take 1 tablet (20 mg total) by mouth daily. 90 tablet 3   tamsulosin  (FLOMAX ) 0.4 MG CAPS capsule Take 1 capsule (0.4 mg total) by mouth daily 30 minutes after evening meal. 30 capsule 11   triamcinolone  cream (KENALOG ) 0.1 % Apply a small amount to skin twice a day 80 g 2   Erenumab -aooe (AIMOVIG ) 70 MG/ML SOAJ Inject 70 mg into the skin every 30 (thirty) days. 3 mL 4   No facility-administered medications prior to visit.    PAST MEDICAL HISTORY: Past Medical History:  Diagnosis Date   Asthma    Coronary artery disease CARDIOLOGIST - DR VICTORY SHARPS--- LAST VISIT OCT 2012   DENIES S & S   Depression    Dysuria    GERD (gastroesophageal reflux disease)    H/O hiatal hernia    History of angina pectoris    Hypertension    Kidney calculi    Left ureteral calculus    Myocardial bridge MID LAD   Shortness of breath    with excertion    PAST SURGICAL HISTORY: Past Surgical History:  Procedure Laterality Date   CARDIAC CATHETERIZATION  01-07-2010-- DR VICTORY SHARPS   NORMAL LVF/ PATENT CORONARY ARTERIES/ MYOCARDIAL BRIDGE MID LAD   CYSTOSCOPY W/ URETERAL STENT PLACEMENT  08/15/2011   Procedure: CYSTOSCOPY WITH STENT REPLACEMENT;  Surgeon: Donnice Gwenyth Brooks, MD;  Location: [facility];  Service: Urology;  Laterality: Left;  stone basket extraction left, left ureteroscopy   EXTRACORPOREAL SHOCK WAVE LITHOTRIPSY  08-07-11   LEFT   VERICOCELECTOMY  1992    FAMILY HISTORY: Family History  Problem Relation Age of Onset   Emphysema Paternal Grandfather    Aortic aneurysm Paternal Grandfather    Heart attack Brother    Cancer - Lung  Father    Hypertension Mother    Hypertension Paternal Grandmother    Alzheimer's disease Maternal Grandmother    Stroke Maternal Grandfather     SOCIAL HISTORY: Social History   Socioeconomic History   Marital status: Married    Spouse name: Not on file   Number of children: 0   Years of education: Not on file   Highest education level: Not on file  Occupational History   Not on file  Tobacco Use   Smoking status: Former    Current  packs/day: 0.00    Average packs/day: 3.0 packs/day for 10.0 years (30.0 ttl pk-yrs)    Types: Cigarettes, Cigars    Start date: 08/14/1995    Quit date: 08/13/2005    Years since quitting: 18.8   Smokeless tobacco: Former  Building Services Engineer status: Never Used  Substance and Sexual Activity   Alcohol use: No    Alcohol/week: 0.0 standard drinks of alcohol   Drug use: No   Sexual activity: Not on file  Other Topics Concern   Not on file  Social History Narrative   Not on file   Social Drivers of Health   Tobacco Use: Medium Risk (03/07/2024)   Patient History    Smoking Tobacco Use: Former    Smokeless Tobacco Use: Former    Passive Exposure: Not on Stage Manager: Not on Ship Broker Insecurity: Not on file  Transportation Needs: Not on file  Physical Activity: Not on file  Stress: Not on file  Social Connections: Not on file  Intimate Partner Violence: Not on file  Depression (PHQ2-9): Not on file  Alcohol Screen: Not on file  Housing: Not on file  Utilities: Not on file  Health Literacy: Not on file     PHYSICAL EXAM  GENERAL EXAM/CONSTITUTIONAL: Vitals:  There were no vitals filed for this visit.  There is no height or weight on file to calculate BMI. Wt Readings from Last 3 Encounters:  03/07/24 227 lb (103 kg)  02/22/24 229 lb (103.9 kg)  02/13/24 229 lb 4.8 oz (104 kg)   Patient is in no distress; well developed, nourished and groomed; neck is supple  CARDIOVASCULAR: Examination of carotid  arteries is normal; no carotid bruits Regular rate and rhythm, no murmurs Examination of peripheral vascular system by observation and palpation is normal  EYES: Ophthalmoscopic exam of optic discs and posterior segments is normal; no papilledema or hemorrhages No results found.  MUSCULOSKELETAL: Gait, strength, tone, movements noted in Neurologic exam below  NEUROLOGIC: MENTAL STATUS:      No data to display         awake, alert, oriented to person, place and time recent and remote memory intact normal attention and concentration language fluent, comprehension intact, naming intact fund of knowledge appropriate  CRANIAL NERVE:  2nd - no papilledema on fundoscopic exam 2nd, 3rd, 4th, 6th - pupils equal and reactive to light, visual fields full to confrontation, extraocular muscles intact, no nystagmus 5th - facial sensation symmetric 7th - facial strength symmetric 8th - hearing intact 9th - palate elevates symmetrically, uvula midline 11th - shoulder shrug symmetric 12th - tongue protrusion midline  MOTOR:  normal bulk and tone, full strength in the BUE, BLE  SENSORY:  normal and symmetric to light touch, temperature, vibration  COORDINATION:  finger-nose-finger, fine finger movements normal  REFLEXES:  deep tendon reflexes present and symmetric  GAIT/STATION:  narrow based gait     DIAGNOSTIC DATA (LABS, IMAGING, TESTING) - I reviewed patient records, labs, notes, testing and imaging myself where available.  Lab Results  Component Value Date   WBC 5.4 03/07/2024   HGB 15.6 03/07/2024   HCT 46.7 03/07/2024   MCV 80 03/07/2024   PLT 235 03/07/2024      Component Value Date/Time   Knox 133 (L) 10/30/2023 0804   Knox 139 08/18/2022 1025   K 3.8 10/30/2023 0804   CL 100 10/30/2023 0804   CO2 26 10/30/2023 0804   GLUCOSE 122 (H)  10/30/2023 0804   BUN 16 10/30/2023 0804   BUN 20 08/18/2022 1025   CREATININE 1.07 10/30/2023 0804   CREATININE 1.32  01/05/2017 1444   CALCIUM  9.3 10/30/2023 0804   PROT 7.0 07/30/2019 0956   ALBUMIN 4.7 07/30/2019 0956   AST 46 (H) 07/30/2019 0956   ALT 92 (H) 07/30/2019 0956   ALKPHOS 64 07/30/2019 0956   BILITOT 0.6 07/30/2019 0956   GFRNONAA >60 10/30/2023 0804   GFRNONAA 65 01/05/2017 1444   GFRAA 90 12/29/2019 0740   GFRAA 75 01/05/2017 1444   Lab Results  Component Value Date   CHOL 124 10/30/2023   HDL 40 (L) 10/30/2023   LDLCALC 65 10/30/2023   TRIG 94 10/30/2023   CHOLHDL 3.1 10/30/2023   Lab Results  Component Value Date   HGBA1C  01/06/2010    5.2 (NOTE)                                                                       According to the ADA Clinical Practice Recommendations for 2011, when HbA1c is used as a screening test:   >=6.5%   Diagnostic of Diabetes Mellitus           (if abnormal result  is confirmed)  5.7-6.4%   Increased risk of developing Diabetes Mellitus  References:Diagnosis and Classification of Diabetes Mellitus,Diabetes Care,2011,34(Suppl 1):S62-S69 and Standards of Medical Care in         Diabetes - 2011,Diabetes Care,2011,34  (Suppl 1):S11-S61.   No results found for: VITAMINB12 Lab Results  Component Value Date   TSH 1.81 01/05/2017    04/03/16 MRI brain (with and without) [I reviewed images myself and agree with interpretation. -VRP]  - normal   11/13/23 CT head  - Normal brain.    ASSESSMENT AND PLAN  54 y.o. year old male here with:  Dx:  1. Migraine with aura and with status migrainosus, not intractable     PLAN:  MIGRAINE PREVENTION  LIFESTYLE CHANGES -Stop or avoid smoking -Decrease or avoid caffeine / alcohol -Eat and sleep on a regular schedule -Exercise several times per week  - topiramate --> tried and had numbness  - cannot use propranolol --> currently on carvedilol  - continue erenumab  (Aimovig ) 70mg  monthly (may increase to 140mg  monthly) - consider atogepant (Qulipta) 60mg  daily  MIGRAINE RESCUE  - ibuprofen , tylenol   as needed - AVOID TRIPTANS (coronary artery disease) - continue rimegepant (Nurtec) 75mg  as needed for breakthrough headache; max 8 per month  No orders of the defined types were placed in this encounter.  Meds ordered this encounter  Medications   Rimegepant Sulfate  (NURTEC) 75 MG TBDP    Sig: Take 1 tablet (75 mg total) by mouth daily as needed.    Dispense:  8 tablet    Refill:  6   Erenumab -aooe (AIMOVIG ) 70 MG/ML SOAJ    Sig: Inject 70 mg into the skin every 30 (thirty) days.    Dispense:  3 mL    Refill:  4   Return in about 8 months (around 02/21/2025) for MyChart visit (15 min).  Virtual Visit via Video Note  I connected with [name] on 06/23/2024 at  1:45 PM EST by a video enabled telemedicine application  and verified that I am speaking with the correct person using two identifiers.   I discussed the limitations of evaluation and management by telemedicine and the availability of in person appointments. The patient expressed understanding and agreed to proceed.  Patient is at home and I am at the office.   I spent 15 minutes of face-to-face and non-face-to-face time with patient.  This included previsit chart review, lab review, study review, order entry, electronic health record documentation, patient education.      EDUARD FABIENE HANLON, MD 06/23/2024, 2:16 PM Certified in Neurology, Neurophysiology and Neuroimaging  [facility] [address] [place], [place] [address] [phone]  "

## 2024-06-27 ENCOUNTER — Encounter (HOSPITAL_BASED_OUTPATIENT_CLINIC_OR_DEPARTMENT_OTHER): Payer: Self-pay

## 2024-06-28 ENCOUNTER — Other Ambulatory Visit (HOSPITAL_COMMUNITY): Payer: Self-pay

## 2024-06-28 ENCOUNTER — Other Ambulatory Visit (HOSPITAL_BASED_OUTPATIENT_CLINIC_OR_DEPARTMENT_OTHER): Payer: Self-pay

## 2024-06-29 ENCOUNTER — Other Ambulatory Visit: Payer: Self-pay

## 2024-07-09 ENCOUNTER — Other Ambulatory Visit (HOSPITAL_BASED_OUTPATIENT_CLINIC_OR_DEPARTMENT_OTHER): Payer: Self-pay

## 2024-07-15 ENCOUNTER — Encounter

## 2024-09-12 ENCOUNTER — Encounter (INDEPENDENT_AMBULATORY_CARE_PROVIDER_SITE_OTHER): Admitting: Ophthalmology
